# Patient Record
Sex: Male | Born: 1950 | Race: White | Hispanic: No | Smoking: Heavy tobacco smoker
Health system: Southern US, Community
[De-identification: ages and names within clinical notes are randomized; demographics above are authoritative.]

## PROBLEM LIST (undated history)

## (undated) DIAGNOSIS — N185 Chronic kidney disease, stage 5: Secondary | ICD-10-CM

## (undated) DIAGNOSIS — I10 Essential (primary) hypertension: Secondary | ICD-10-CM

## (undated) DIAGNOSIS — E079 Disorder of thyroid, unspecified: Secondary | ICD-10-CM

## (undated) DIAGNOSIS — R809 Proteinuria, unspecified: Secondary | ICD-10-CM

## (undated) DIAGNOSIS — F17200 Nicotine dependence, unspecified, uncomplicated: Secondary | ICD-10-CM

## (undated) DIAGNOSIS — N281 Cyst of kidney, acquired: Secondary | ICD-10-CM

## (undated) DIAGNOSIS — E213 Hyperparathyroidism, unspecified: Secondary | ICD-10-CM

## (undated) DIAGNOSIS — E785 Hyperlipidemia, unspecified: Secondary | ICD-10-CM

## (undated) DIAGNOSIS — K219 Gastro-esophageal reflux disease without esophagitis: Secondary | ICD-10-CM

## (undated) DIAGNOSIS — R7303 Prediabetes: Secondary | ICD-10-CM

## (undated) HISTORY — DX: Disorder of thyroid, unspecified: E07.9

## (undated) HISTORY — DX: Gastro-esophageal reflux disease without esophagitis: K21.9

## (undated) HISTORY — DX: Essential (primary) hypertension: I10

## (undated) HISTORY — DX: Hyperlipidemia, unspecified: E78.5

---

## 2013-11-11 HISTORY — PX: SPINE SURGERY: SHX786

## 2021-03-26 ENCOUNTER — Other Ambulatory Visit: Payer: Self-pay

## 2021-03-26 ENCOUNTER — Encounter: Payer: Self-pay | Admitting: Internal Medicine

## 2021-03-26 ENCOUNTER — Other Ambulatory Visit: Payer: Self-pay | Admitting: Internal Medicine

## 2021-03-26 ENCOUNTER — Ambulatory Visit (INDEPENDENT_AMBULATORY_CARE_PROVIDER_SITE_OTHER): Payer: Medicare Other | Admitting: Internal Medicine

## 2021-03-26 VITALS — BP 138/78 | HR 78 | Ht 71.0 in | Wt 190.0 lb

## 2021-03-26 DIAGNOSIS — I10 Essential (primary) hypertension: Secondary | ICD-10-CM

## 2021-03-26 DIAGNOSIS — F17200 Nicotine dependence, unspecified, uncomplicated: Secondary | ICD-10-CM | POA: Insufficient documentation

## 2021-03-26 DIAGNOSIS — E782 Mixed hyperlipidemia: Secondary | ICD-10-CM

## 2021-03-26 DIAGNOSIS — N401 Enlarged prostate with lower urinary tract symptoms: Secondary | ICD-10-CM

## 2021-03-26 DIAGNOSIS — E039 Hypothyroidism, unspecified: Secondary | ICD-10-CM | POA: Diagnosis not present

## 2021-03-26 DIAGNOSIS — N1831 Chronic kidney disease, stage 3a: Secondary | ICD-10-CM | POA: Insufficient documentation

## 2021-03-26 DIAGNOSIS — N138 Other obstructive and reflux uropathy: Secondary | ICD-10-CM | POA: Diagnosis not present

## 2021-03-26 DIAGNOSIS — G8929 Other chronic pain: Secondary | ICD-10-CM

## 2021-03-26 DIAGNOSIS — M545 Low back pain, unspecified: Secondary | ICD-10-CM

## 2021-03-26 HISTORY — DX: Mixed hyperlipidemia: E78.2

## 2021-03-26 HISTORY — DX: Hypothyroidism, unspecified: E03.9

## 2021-03-26 HISTORY — DX: Other chronic pain: G89.29

## 2021-03-26 HISTORY — DX: Essential (primary) hypertension: I10

## 2021-03-26 HISTORY — DX: Other obstructive and reflux uropathy: N13.8

## 2021-03-26 HISTORY — DX: Low back pain, unspecified: M54.50

## 2021-03-26 HISTORY — DX: Benign prostatic hyperplasia with lower urinary tract symptoms: N40.1

## 2021-03-26 LAB — POC URINALYSIS WITH MICROSCOPIC (NON AUTO)MANUAL RESULT
Bilirubin, UA: NEGATIVE
Crystals: 0
Glucose, UA: NEGATIVE
Ketones, UA: NEGATIVE
Mucus, UA: 0
Nitrite, UA: NEGATIVE
Protein, UA: POSITIVE — AB
RBC: 5 M/uL (ref 4.69–6.13)
Spec Grav, UA: 1.025 (ref 1.010–1.025)
Urobilinogen, UA: 0.2 E.U./dL
WBC Casts, UA: 100
pH, UA: 5 (ref 5.0–8.0)

## 2021-03-26 NOTE — Patient Instructions (Signed)
[facility] will be calling you to schedule an appointment.    Use Breztri - one inhalation twice a day.  We will call you with the urine culture report and let you know if antibiotics are needed.

## 2021-03-26 NOTE — Progress Notes (Signed)
Date:  03/26/2021   Name:  Tyrone Moore   DOB:  [birth year]   MRN:  [id number]  New patient to establish care.  Moving here from [place] to be closer to son and daughter in law.  Retired.  Ex [employer] [place] veteran.  Chief Complaint: Establish Care and Hypertension  Hyperlipidemia This is a chronic problem. The problem is controlled. Associated symptoms include shortness of breath. Pertinent negatives include no chest pain. Current antihyperlipidemic treatment includes statins. There are no compliance problems.  Risk factors for coronary artery disease include hypertension.  Thyroid Problem Presents for follow-up (since 1070) visit. Patient reports no anxiety, constipation, diarrhea, fatigue, palpitations, weight gain or weight loss. The symptoms have been stable. His past medical history is significant for hyperlipidemia. There is no history of heart failure.  Hypertension This is a chronic problem. The problem is controlled (at home 131/70). Associated symptoms include shortness of breath. Pertinent negatives include no chest pain, headaches or palpitations. Past treatments include ACE inhibitors and beta blockers. The current treatment provides significant improvement. There are no compliance problems.  Hypertensive end-organ damage includes kidney disease. There is no history of CAD/MI, CVA or heart failure. Identifiable causes of hypertension include a thyroid problem.  Gastroesophageal Reflux He complains of heartburn and wheezing. He reports no abdominal pain, no chest pain or no coughing. This is a recurrent problem. The problem occurs occasionally. The heartburn does not wake him from sleep. The heartburn does not limit his activity. The heartburn doesn't change with position. Pertinent negatives include no fatigue or weight loss. He has tried a PPI for the symptoms.  Urinary Frequency  This is a chronic problem. The problem has been waxing and waning. The quality of the pain is  described as burning (recently with urination and saw blood once last week). The pain is mild. There has been no fever. Associated symptoms include frequency, hematuria and urgency. He has tried nothing for the symptoms.  BPH - he was started on Flomax sometime last year but it has not helped his urinary stream. He was referred to Urology but was immediately told that he needed surgery without any exam so he left.  No results found for: CREATININE, BUN, NA, K, CL, CO2 No results found for: CHOL, HDL, LDLCALC, LDLDIRECT, TRIG, CHOLHDL No results found for: TSH No results found for: HGBA1C No results found for: WBC, HGB, HCT, MCV, PLT No results found for: ALT, AST, GGT, ALKPHOS, BILITOT   Review of Systems  Constitutional: Negative for fatigue, unexpected weight change, weight gain and weight loss.  HENT: Negative for nosebleeds and trouble swallowing.   Eyes: Negative for visual disturbance.  Respiratory: Positive for shortness of breath and wheezing. Negative for cough and chest tightness.   Cardiovascular: Negative for chest pain, palpitations and leg swelling.  Gastrointestinal: Positive for heartburn. Negative for abdominal pain, constipation and diarrhea.  Genitourinary: Positive for decreased urine volume, difficulty urinating, frequency, hematuria and urgency.  Musculoskeletal: Positive for arthralgias and back pain.  Skin: Negative for rash.  Neurological: Negative for dizziness, weakness, light-headedness and headaches.  Psychiatric/Behavioral: Negative for dysphoric mood and sleep disturbance. The patient is not nervous/anxious.     Patient Active Problem List   Diagnosis Date Noted  . 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  . Stage 3a chronic kidney disease ([facility]) 03/26/2021  . Mixed hyperlipidemia 03/26/2021  . Acquired hypothyroidism 03/26/2021  . Essential hypertension 03/26/2021  . Tobacco use disorder 03/26/2021  . Chronic bilateral  low back pain  without sciatica 03/26/2021    No Known Allergies  Past Surgical History:  Procedure Laterality Date  . SPINE SURGERY  2015   X 4 surgery - lumbar disc    Social History   Tobacco Use  . Smoking status: Current Every Day Smoker    Packs/day: 1.50    Years: 54.00    Pack years: 81.00  . Smokeless tobacco: Never Used  Vaping Use  . Vaping Use: Never used  Substance Use Topics  . Alcohol use: Not Currently  . Drug use: Never     Medication list has been reviewed and updated.  Current Meds  Medication Sig  . atorvastatin (LIPITOR) 40 MG tablet Take 40 mg by mouth daily.  . carvedilol (COREG) 25 MG tablet Take 25 mg by mouth 2 (two) times daily with a meal.  . Cholecalciferol (D3-1000) 25 MCG (1000 UT) capsule Take 1,000 Units by mouth daily.  Marland Kitchen levothyroxine (EUTHYROX) 112 MCG tablet Take 112 mcg by mouth daily before breakfast.  . lisinopril (ZESTRIL) 10 MG tablet Take 10 mg by mouth daily.  . Magnesium 500 MG TABS Take 1 tablet by mouth daily.  . Multiple Vitamins-Minerals (MULTIVITAL-M PO) Take by mouth.  Marland Kitchen omeprazole (PRILOSEC) 20 MG capsule Take 20 mg by mouth daily.  . tamsulosin (FLOMAX) 0.4 MG CAPS capsule Take 0.8 mg by mouth daily after supper.    PHQ 2/9 Scores 03/26/2021  PHQ - 2 Score 1  PHQ- 9 Score 9    GAD 7 : Generalized Anxiety Score 03/26/2021  Nervous, Anxious, on Edge 0  Control/stop worrying 0  Worry too much - different things 0  Trouble relaxing 0  Restless 0  Easily annoyed or irritable 0  Afraid - awful might happen 0  Total GAD 7 Score 0  Anxiety Difficulty Not difficult at all    BP Readings from Last 3 Encounters:  03/26/21 138/78    Physical Exam Vitals and nursing note reviewed.  Constitutional:      General: He is not in acute distress.    Appearance: He is well-developed.  HENT:     Head: Normocephalic and atraumatic.  Neck:     Vascular: No carotid bruit.  Cardiovascular:     Rate and Rhythm: Normal rate and regular  rhythm.     Pulses: Normal pulses.     Heart sounds: No murmur heard.   Pulmonary:     Effort: Pulmonary effort is normal. No respiratory distress.     Breath sounds: Wheezing and rhonchi present.  Musculoskeletal:     Cervical back: Normal range of motion.       Back:     Right lower leg: No edema.     Left lower leg: No edema.     Comments: At least three separate surgical incision sites seen  Lymphadenopathy:     Cervical: No cervical adenopathy.  Skin:    General: Skin is warm and dry.     Capillary Refill: Capillary refill takes less than 2 seconds.     Findings: No rash.  Neurological:     General: No focal deficit present.     Mental Status: He is alert and oriented to person, place, and time.     Motor: Motor function is intact.     Gait: Gait is intact.     Deep Tendon Reflexes:     Reflex Scores:      Patellar reflexes are 2+ on the right side and  2+ on the left side. Psychiatric:        Mood and Affect: Mood normal.        Behavior: Behavior normal.     Wt Readings from Last 3 Encounters:  03/26/21 190 lb (86.2 kg)    BP 138/78   Pulse 78   Ht '5\' 11"'$  (1.803 m)   Wt 190 lb (86.2 kg)   SpO2 97%   BMI 26.50 kg/m   Assessment and Plan: 1. Essential hypertension Clinically stable exam with well controlled BP on lisinopril and coreg. Tolerating medications without side effects at this time. Pt to continue current regimen and low sodium diet; benefits of regular exercise as able discussed. - CBC with Differential/Platelet - Comprehensive metabolic panel  2. Acquired hypothyroidism Supplemented; wait for labs to refill medication - TSH + free T4  3. Mixed hyperlipidemia On appropriate statin therapy No hx of CAD or CVA - Lipid panel  4. Stage 3a chronic kidney disease (HCC) Pt is aware and avoids nsaids He continues smoke  - Comprehensive metabolic panel  5. BPH with obstruction/lower urinary tract symptoms From hx he appears to have  significant obstruction UA is positive as well so will wait for culture to treat with antibiotics - POC urinalysis w microscopic (non auto) - PSA - Ambulatory referral to Urology - Urine Culture  6. Tobacco use disorder Pt is not interested in quitting He is not interested in LDCT screening He is not interested in Prevnar-20 Samples of Breztri given - one inhalation bid  7. Chronic bilateral low back pain without sciatica S/p several surgeries He is not on any daily medication   Partially dictated using Editor, commissioning. Any errors are unintentional.  Halina Maidens, MD [facility]  03/26/2021

## 2021-03-27 ENCOUNTER — Encounter: Payer: Self-pay | Admitting: Internal Medicine

## 2021-03-27 DIAGNOSIS — N184 Chronic kidney disease, stage 4 (severe): Secondary | ICD-10-CM

## 2021-03-27 HISTORY — DX: Chronic kidney disease, stage 4 (severe): N18.4

## 2021-03-27 LAB — CBC WITH DIFFERENTIAL/PLATELET
Basophils Absolute: 0.1 10*3/uL (ref 0.0–0.2)
Basos: 1 %
EOS (ABSOLUTE): 0.5 10*3/uL — ABNORMAL HIGH (ref 0.0–0.4)
Eos: 6 %
Hematocrit: 35.8 % — ABNORMAL LOW (ref 37.5–51.0)
Hemoglobin: 12.2 g/dL — ABNORMAL LOW (ref 13.0–17.7)
Immature Grans (Abs): 0 10*3/uL (ref 0.0–0.1)
Immature Granulocytes: 0 %
Lymphocytes Absolute: 1.9 10*3/uL (ref 0.7–3.1)
Lymphs: 24 %
MCH: 32.9 pg (ref 26.6–33.0)
MCHC: 34.1 g/dL (ref 31.5–35.7)
MCV: 97 fL (ref 79–97)
Monocytes Absolute: 0.8 10*3/uL (ref 0.1–0.9)
Monocytes: 10 %
Neutrophils Absolute: 4.8 10*3/uL (ref 1.4–7.0)
Neutrophils: 59 %
Platelets: 186 10*3/uL (ref 150–450)
RBC: 3.71 x10E6/uL — ABNORMAL LOW (ref 4.14–5.80)
RDW: 12.3 % (ref 11.6–15.4)
WBC: 7.9 10*3/uL (ref 3.4–10.8)

## 2021-03-27 LAB — COMPREHENSIVE METABOLIC PANEL
ALT: 14 IU/L (ref 0–44)
AST: 18 IU/L (ref 0–40)
Albumin/Globulin Ratio: 1.6 (ref 1.2–2.2)
Albumin: 4.2 g/dL (ref 3.8–4.8)
Alkaline Phosphatase: 149 IU/L — ABNORMAL HIGH (ref 44–121)
BUN/Creatinine Ratio: 12 (ref 10–24)
BUN: 39 mg/dL — ABNORMAL HIGH (ref 8–27)
Bilirubin Total: 0.3 mg/dL (ref 0.0–1.2)
CO2: 18 mmol/L — ABNORMAL LOW (ref 20–29)
Calcium: 9.1 mg/dL (ref 8.6–10.2)
Chloride: 111 mmol/L — ABNORMAL HIGH (ref 96–106)
Creatinine, Ser: 3.25 mg/dL — ABNORMAL HIGH (ref 0.76–1.27)
Globulin, Total: 2.7 g/dL (ref 1.5–4.5)
Glucose: 85 mg/dL (ref 65–99)
Potassium: 5.3 mmol/L — ABNORMAL HIGH (ref 3.5–5.2)
Sodium: 143 mmol/L (ref 134–144)
Total Protein: 6.9 g/dL (ref 6.0–8.5)
eGFR: 20 mL/min/{1.73_m2} — ABNORMAL LOW (ref >59)

## 2021-03-27 LAB — LIPID PANEL
Chol/HDL Ratio: 4 ratio (ref 0.0–5.0)
Cholesterol, Total: 129 mg/dL (ref 100–199)
HDL: 32 mg/dL — ABNORMAL LOW (ref >39)
LDL Chol Calc (NIH): 70 mg/dL (ref 0–99)
Triglycerides: 157 mg/dL — ABNORMAL HIGH (ref 0–149)
VLDL Cholesterol Cal: 27 mg/dL (ref 5–40)

## 2021-03-27 LAB — TSH+FREE T4
Free T4: 1.55 ng/dL (ref 0.82–1.77)
TSH: 0.712 u[IU]/mL (ref 0.450–4.500)

## 2021-03-27 LAB — PSA: Prostate Specific Ag, Serum: 4 ng/mL (ref 0.0–4.0)

## 2021-03-28 ENCOUNTER — Other Ambulatory Visit: Payer: Self-pay

## 2021-03-28 ENCOUNTER — Other Ambulatory Visit: Payer: Self-pay | Admitting: Internal Medicine

## 2021-03-28 DIAGNOSIS — N289 Disorder of kidney and ureter, unspecified: Secondary | ICD-10-CM

## 2021-03-28 DIAGNOSIS — E039 Hypothyroidism, unspecified: Secondary | ICD-10-CM

## 2021-03-28 LAB — URINE CULTURE

## 2021-03-28 MED ORDER — LEVOTHYROXINE SODIUM 112 MCG PO TABS: 112.0000 ug | ORAL_TABLET | Freq: Every day | ORAL | 1 refills | Status: DC

## 2021-03-28 NOTE — Progress Notes (Signed)
Please review pt wants refill on levothyroxine 112 mcg.  KP

## 2021-04-06 ENCOUNTER — Other Ambulatory Visit
Admission: RE | Admit: 2021-04-06 | Discharge: 2021-04-06 | Disposition: A | Discharge status: Home / Self Care | Payer: Medicare Other | Attending: Urology | Admitting: Urology

## 2021-04-06 ENCOUNTER — Other Ambulatory Visit: Payer: Self-pay

## 2021-04-06 ENCOUNTER — Encounter: Payer: Self-pay | Admitting: Urology

## 2021-04-06 ENCOUNTER — Ambulatory Visit: Payer: Medicare Other | Admitting: Urology

## 2021-04-06 VITALS — BP 132/66 | HR 80 | Ht 71.0 in | Wt 208.0 lb

## 2021-04-06 DIAGNOSIS — N401 Enlarged prostate with lower urinary tract symptoms: Secondary | ICD-10-CM

## 2021-04-06 DIAGNOSIS — N138 Other obstructive and reflux uropathy: Secondary | ICD-10-CM | POA: Insufficient documentation

## 2021-04-06 DIAGNOSIS — N184 Chronic kidney disease, stage 4 (severe): Secondary | ICD-10-CM

## 2021-04-06 DIAGNOSIS — R310 Gross hematuria: Secondary | ICD-10-CM | POA: Diagnosis not present

## 2021-04-06 LAB — URINALYSIS, COMPLETE (UACMP) WITH MICROSCOPIC
Bilirubin Urine: NEGATIVE
Glucose, UA: NEGATIVE mg/dL
Ketones, ur: NEGATIVE mg/dL
Nitrite: NEGATIVE
Protein, ur: >300 mg/dL — AB
RBC / HPF: >50 RBC/hpf (ref 0–5)
Specific Gravity, Urine: 1.02 (ref 1.005–1.030)
WBC, UA: >50 WBC/hpf (ref 0–5)
pH: 6 (ref 5.0–8.0)

## 2021-04-06 LAB — BLADDER SCAN AMB NON-IMAGING

## 2021-04-06 NOTE — Progress Notes (Signed)
04/06/2021 11:22 AM   Tyrone Moore [birth year] [id number]  Referring provider: Glean Hess, MD [address] [place],  [place] [address]  Chief Complaint  Patient presents with  . Benign Prostatic Hypertrophy    New patient    HPI: 70 year old male who presents today for further evaluation of progressive urinary symptoms.  He recently moved to live with his son, previously lived in [place].  He reports that over the past year, he has had progressive worsening urinary symptoms which are primarily nocturia but also weak stream, incomplete bladder emptying as well as some daytime frequency.  He was started on Flomax about a year ago and has not noticed much improvement with this.  IPSS as below.  His most recent PSA is 4.0, he has no previous for comparison.  He also reports that about a month ago, he experienced an episode of painless gross hematuria.  He is a longtime smoker, lifelong a pack and a half a day.  No history of kidney stones.  No history of flank pain.  Urinalysis provided to PCP a few weeks ago was somewhat suspicious but the urine culture was negative.  There was blood present.  He is planning on repeating this today.   Results for orders placed or performed in visit on 04/06/21  BLADDER SCAN AMB NON-IMAGING  Result Value Ref Range   Scan Result 131m       IPSS    Row Name 04/06/21 1100         International Prostate Symptom Score   How often have you had the sensation of not emptying your bladder? About half the time     How often have you had to urinate less than every two hours? More than half the time     How often have you found you stopped and started again several times when you urinated? About half the time     How often have you found it difficult to postpone urination? Less than half the time     How often have you had a weak urinary stream? About half the time     How often have you had to strain to start urination? More  than half the time     How many times did you typically get up at night to urinate? 5 Times     Total IPSS Score 24           Quality of Life due to urinary symptoms   If you were to spend the rest of your life with your urinary condition just the way it is now how would you feel about that? Unhappy            Score:  1-7 Mild 8-19 Moderate 20-35 Severe    PMH: Past Medical History:  Diagnosis Date  . Acquired hypothyroidism 03/26/2021  . 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  . Chronic bilateral low back pain without sciatica 03/26/2021   S/p 4 lumbar spine surgeries  . CKD (chronic kidney disease) stage 4, GFR 15-29 ml/min (HCC) 03/27/2021  . Essential hypertension 03/26/2021  . Hyperlipidemia   . Hypertension   . Mixed hyperlipidemia 03/26/2021  . Thyroid disease     Surgical History: Past Surgical History:  Procedure Laterality Date  . SPINE SURGERY  2015   X 4 surgery - lumbar disc    Home Medications:  Allergies as of 04/06/2021   No Known Allergies     Medication List  Accurate as of Apr 06, 2021 11:22 AM. If you have any questions, ask your nurse or doctor.        atorvastatin 40 MG tablet Commonly known as: LIPITOR Take 40 mg by mouth daily.   carvedilol 25 MG tablet Commonly known as: COREG Take 25 mg by mouth 2 (two) times daily with a meal.   D3-1000 25 MCG (1000 UT) capsule Generic drug: Cholecalciferol Take 1,000 Units by mouth daily.   levothyroxine 112 MCG tablet Commonly known as: Euthyrox Take 1 tablet (112 mcg total) by mouth daily before breakfast.   lisinopril 10 MG tablet Commonly known as: ZESTRIL Take 10 mg by mouth daily.   Magnesium 500 MG Tabs Take 1 tablet by mouth daily.   MULTIVITAL-M PO Take by mouth.   omeprazole 20 MG capsule Commonly known as: PRILOSEC Take 20 mg by mouth daily.   tamsulosin 0.4 MG Caps capsule Commonly known as: FLOMAX Take 0.8 mg by mouth daily after supper.        Allergies: No Known Allergies  Family History: Family History  Problem Relation Age of Onset  . Hypertension Mother   . Heart disease Mother   . Heart disease Sister   . Hypertension Sister   . Prostate cancer Neg Hx   . Bladder Cancer Neg Hx   . Kidney cancer Neg Hx     Social History:  reports that he has been smoking. He has a 81.00 pack-year smoking history. He has never used smokeless tobacco. He reports previous alcohol use. He reports that he does not use drugs.   Physical Exam: BP 132/66   Pulse 80   Ht '5\' 11"'$  (1.803 m)   Wt 208 lb (94.3 kg)   BMI 29.01 kg/m   Constitutional:  Alert and oriented, No acute distress.  Accompanied by son today.  Cigarettes noted in pocket.  Smoker's cough. HEENT: [place] AT, moist mucus membranes.  Trachea midline, no masses. Cardiovascular: No clubbing, cyanosis, or edema. Respiratory: Normal respiratory effort, no increased work of breathing. GI: Abdomen is soft, nontender, nondistended, no abdominal masses Rectal: Normal sphincter tone.  No prostate nodules.  Enlarged, 50+ cc. Skin: No rashes, bruises or suspicious lesions. Neurologic: Grossly intact, no focal deficits, moving all 4 extremities. Psychiatric: Normal mood and affect.  Laboratory Data: Lab Results  Component Value Date   WBC 7.9 03/26/2021   HGB 12.2 (L) 03/26/2021   HCT 35.8 (L) 03/26/2021   MCV 97 03/26/2021   PLT 186 03/26/2021    Lab Results  Component Value Date   CREATININE 3.25 (H) 03/26/2021    Urinalysis    Component Value Date/Time   BILIRUBINUR neg 03/26/2021 1433   PROTEINUR Positive (A) 03/26/2021 1433   UROBILINOGEN 0.2 03/26/2021 1433   NITRITE neg 03/26/2021 1433   LEUKOCYTESUR Moderate (2+) (A) 03/26/2021 1433    Lab Results  Component Value Date   MUCUS 0 03/26/2021     Assessment & Plan:    1. BPH with obstruction/lower urinary tract symptoms Progressive obstructive urinary symptoms, likely related to underlying  BPH  Notably, his PSA is borderline elevated but is possibly appropriate for his gland size, rectal exam today was benign we will continue to follow this, no indication for prostate biopsy at this time  We discussed options including the addition of finasteride for optimization of pharmacological management of his BPH along with possible side effects of this medication which may be lifelong.  Alternatively, he can consider an outlet procedure we  discussed the risk and benefits and possible types of procedures this may entail.  He is interested in this.  As such, we will go ahead and schedule cystoscopy/prostate sizing. - BLADDER SCAN AMB NON-IMAGING  2. Gross hematuria Gross and microscopic hematuria, repeat urinalysis today pending  Given his extensive and ongoing smoking history, significant concern for underlying bladder malignancy.  In light of contrast shortage, not able to perform a CT urogram.  We will go ahead and plan to proceed with cystoscopy and pending results, will pursue further upper tract imaging at that time pending contrast availability and/or cystoscopic findings which may necessitate intervention in the operating room.  3. CKD (chronic kidney disease) stage 4, GFR 15-29 ml/min (HCC) Baseline creatinine elevated, borderline bladder emptying today although suspect this is not likely contributing factor, will continue further work-up as above to assess  If you are unable to pursue upper tract imaging, may consider renal ultrasound and lieu of CT urogram, TBD  Follow-up for cystoscopy/TRUS  Hollice Espy, MD  [facility] [address] [place], [place] [address] [phone]

## 2021-04-06 NOTE — Patient Instructions (Signed)
Cystoscopy Cystoscopy is a procedure that is used to help diagnose and sometimes treat conditions that affect the lower urinary tract. The lower urinary tract includes the bladder and the urethra. The urethra is the tube that drains urine from the bladder. Cystoscopy is done using a thin, tube-shaped instrument with a light and camera at the end (cystoscope). The cystoscope may be hard or flexible, depending on the goal of the procedure. The cystoscope is inserted through the urethra, into the bladder. Cystoscopy may be recommended if you have:  Urinary tract infections that keep coming back.  Blood in the urine (hematuria).  An inability to control when you urinate (urinary incontinence) or an overactive bladder.  Unusual cells found in a urine sample.  A blockage in the urethra, such as a urinary stone.  Painful urination.  An abnormality in the bladder found during an intravenous pyelogram (IVP) or CT scan. Cystoscopy may also be done to remove a sample of tissue to be examined under a microscope (biopsy). What are the risks? Generally, this is a safe procedure. However, problems may occur, including:  Infection.  Bleeding.  What happens during the procedure?  1. You will be given one or more of the following: ? A medicine to numb the area (local anesthetic). 2. The area around the opening of your urethra will be cleaned. 3. The cystoscope will be passed through your urethra into your bladder. 4. Germ-free (sterile) fluid will flow through the cystoscope to fill your bladder. The fluid will stretch your bladder so that your health care provider can clearly examine your bladder walls. 5. Your doctor will look at the urethra and bladder. 6. The cystoscope will be removed The procedure may vary among health care providers  What can I expect after the procedure? After the procedure, it is common to have: 1. Some soreness or pain in your abdomen and urethra. 2. Urinary symptoms.  These include: ? Mild pain or burning when you urinate. Pain should stop within a few minutes after you urinate. This may last for up to 1 week. ? A small amount of blood in your urine for several days. ? Feeling like you need to urinate but producing only a small amount of urine. Follow these instructions at home: General instructions  Return to your normal activities as told by your health care provider.   Do not drive for 24 hours if you were given a sedative during your procedure.  Watch for any blood in your urine. If the amount of blood in your urine increases, call your health care provider.  If a tissue sample was removed for testing (biopsy) during your procedure, it is up to you to get your test results. Ask your health care provider, or the department that is doing the test, when your results will be ready.  Drink enough fluid to keep your urine pale yellow.  Keep all follow-up visits as told by your health care provider. This is important. Contact a health care provider if you:  Have pain that gets worse or does not get better with medicine, especially pain when you urinate.  Have trouble urinating.  Have more blood in your urine. Get help right away if you:  Have blood clots in your urine.  Have abdominal pain.  Have a fever or chills.  Are unable to urinate. Summary  Cystoscopy is a procedure that is used to help diagnose and sometimes treat conditions that affect the lower urinary tract.  Cystoscopy is done using   a thin, tube-shaped instrument with a light and camera at the end.  After the procedure, it is common to have some soreness or pain in your abdomen and urethra.  Watch for any blood in your urine. If the amount of blood in your urine increases, call your health care provider.  If you were prescribed an antibiotic medicine, take it as told by your health care provider. Do not stop taking the antibiotic even if you start to feel better. This  information is not intended to replace advice given to you by your health care provider. Make sure you discuss any questions you have with your health care provider. Document Revised: 10/20/2018 Document Reviewed: 10/20/2018 Elsevier Patient Education  [employer].   Transrectal Ultrasound A transrectal ultrasound is a procedure that uses sound waves to create images of the prostate gland and nearby tissues. For this procedure, an ultrasound probe is placed in the rectum. The probe sends sound waves through the wall of the rectum into the prostate gland, which is located in front of the rectum. The images show the size and shape of the prostate gland and nearby structures. You may need this test if you have:  Trouble urinating.  Trouble getting your partner pregnant (infertility).  An abnormal result from a prostate screening exam. Tell a health care provider about:  Any allergies you have.  All medicines you are taking, including vitamins, herbs, eye drops, creams, and over-the-counter medicines.  Any blood disorders you have.  Any medical conditions you have.  Any surgeries you have had. What are the risks? Generally, this is a safe procedure. However, problems may occur, including:  Discomfort during the procedure. This is rare.  Blood in your urine or sperm after the procedure. This may occur if a sample of tissue (biopsy) is taken during the procedure. What happens before the procedure?  Your health care provider may instruct you to use an enema 1-4 hours before the procedure. Follow instructions from your health care provider about how to do the enema.  Ask your health care provider about: ? Changing or stopping your regular medicines. This is especially important if you are taking diabetes medicines or blood thinners. ? Taking medicines such as aspirin and ibuprofen. These medicines can thin your blood. Do not take these medicines unless your health care provider  tells you to take them. ? Taking over-the-counter medicines, vitamins, herbs, and supplements. What happens during the procedure?  You will be asked to lie down on your left side on an exam table.  You will bend your knees toward your chest.  Gel will be put on a probe, and the probe will be gently inserted into your rectum. This may cause a feeling of fullness.  The probe will send signals to a computer that will create images. These will be displayed on a monitor that looks like a small television screen.  The technician will slightly rotate the probe throughout the procedure. While rotating the probe, he or she will view and capture images of the prostate gland and the surrounding structures from different angles.  Your health care provider may take a biopsy sample of prostate tissue during the procedure. The images captured from the ultrasound will help guide the needle used to remove a sample of tissue. The sample will be sent to a lab for testing.  The probe will be removed. The procedure may vary among health care providers and hospitals. What can I expect after the procedure?  It  is up to you to get the results of your procedure. Ask your health care provider, or the department that is doing the procedure, when your results will be ready.  Keep all follow-up visits as told by your health care provider. This is important. Summary  A transrectal ultrasound is a procedure that uses sound waves to create images of the prostate gland and nearby tissues.  The images show the size and shape of the prostate gland and nearby structures.  Before the procedure, ask your health care provider about changing or stopping your regular medicines. This is especially important if you are taking diabetes medicines or blood thinners. This information is not intended to replace advice given to you by your health care provider. Make sure you discuss any questions you have with your health care  provider. Document Revised: 12/22/2019 Document Reviewed: 12/22/2019 Elsevier Patient Education  2021 [employer].

## 2021-05-04 ENCOUNTER — Other Ambulatory Visit: Payer: Self-pay

## 2021-05-04 ENCOUNTER — Ambulatory Visit (INDEPENDENT_AMBULATORY_CARE_PROVIDER_SITE_OTHER): Payer: Medicare Other | Admitting: Urology

## 2021-05-04 ENCOUNTER — Other Ambulatory Visit
Admission: RE | Admit: 2021-05-04 | Discharge: 2021-05-04 | Disposition: A | Discharge status: Home / Self Care | Payer: Medicare Other | Attending: Urology | Admitting: Urology

## 2021-05-04 VITALS — BP 116/65 | HR 89 | Ht 71.0 in | Wt 208.0 lb

## 2021-05-04 DIAGNOSIS — N138 Other obstructive and reflux uropathy: Secondary | ICD-10-CM

## 2021-05-04 DIAGNOSIS — R310 Gross hematuria: Secondary | ICD-10-CM | POA: Insufficient documentation

## 2021-05-04 DIAGNOSIS — N401 Enlarged prostate with lower urinary tract symptoms: Secondary | ICD-10-CM

## 2021-05-04 DIAGNOSIS — N184 Chronic kidney disease, stage 4 (severe): Secondary | ICD-10-CM

## 2021-05-04 LAB — URINALYSIS, COMPLETE (UACMP) WITH MICROSCOPIC
Bilirubin Urine: NEGATIVE
Glucose, UA: NEGATIVE mg/dL
Ketones, ur: NEGATIVE mg/dL
Nitrite: NEGATIVE
Protein, ur: >300 mg/dL — AB
Specific Gravity, Urine: 1.025 (ref 1.005–1.030)
WBC, UA: >50 WBC/hpf (ref 0–5)
pH: 5.5 (ref 5.0–8.0)

## 2021-05-04 MED ORDER — CEPHALEXIN 500 MG PO CAPS: 500.0000 mg | ORAL_CAPSULE | Freq: Three times a day (TID) | ORAL | 0 refills | Status: AC

## 2021-05-04 MED ORDER — CEFTRIAXONE SODIUM 1 G IJ SOLR
1.0000 g | Freq: Once | INTRAMUSCULAR | Status: AC
  Administered 2021-05-04: 1 g via INTRAMUSCULAR

## 2021-05-04 NOTE — Progress Notes (Signed)
IM Injection  Patient is present today for an IM Injection  Drug: Rocephin Dose:1GM Location:Right upper outer buttocks Lot: [id number] Exp:05/2021 Patient tolerated well, no complications were noted  Preformed by: Fonnie Jarvis, CMA

## 2021-05-04 NOTE — Progress Notes (Signed)
05/04/21  Chief Complaint  Patient presents with   Cysto    TRUS     HPI: 70 year old male with worsening urinary symptoms, pyuria and gross hematuria who presents today for cystoscopy for further evaluation of his urinary symptoms.  Urinalysis today and is again grossly positive although urine cultures have been negative.  As such, we elected to proceed today for diagnostic reasons and he was administered 1 g of IM Rocephin.   Please see previous notes for details.     There were no vitals taken for this visit. NED. A&Ox3.   No respiratory distress   Abd soft, NT, ND Normal phallus with bilateral descended testicles    Cystoscopy Procedure Note  Patient identification was confirmed, informed consent was obtained, and patient was prepped using Betadine solution.  Lidocaine jelly was administered per urethral meatus.    Preoperative abx where received prior to procedure.     Pre-Procedure: - Inspection reveals a normal caliber ureteral meatus.  Procedure: The flexible cystoscope was introduced without difficulty - No urethral strictures/lesions are present. - Enlarged prostate  - Elevated bladder neck - Bilateral ureteral orifices identified -Heavily trabeculated bladder initially with extremely purulent and debris-filled urine.  I irrigated the bladder several times until visualization was improved.  No obvious tumors were visualized although visualization was suboptimal.  Bladder was heavily trabeculated with saccules and probable diverticula.   Post-Procedure: - Patient tolerated the procedure well     Prostate transrectal ultrasound sizing   Informed consent was obtained after discussing risks/benefits of the procedure.  A time out was performed to ensure correct patient identity.   Pre-Procedure: -Transrectal probe was placed with some difficulty, did not particularly tolerate the probe -Briefly was able to visualize the prostate but not measure, thickened  trabeculated bladder appreciated     Assessment/ Plan:  1. Gross hematuria Cystoscopy today without any obvious tumors although visualization was suboptimal  Unable to receive IV contrast secondary to chronic kidney disease, as such we will order a noncontrast CT scan of the abdomen pelvis for further evaluation and consider retrograde pyelogram to further image the upper tracts at the time of probable outlet procedure - Urine Culture; Future - cefTRIAXone (ROCEPHIN) injection 1 g - CT ABDOMEN PELVIS WO CONTRAST; Future  2. BPH with obstruction/lower urinary tract symptoms Significantly enlarged prostate with what appears to be nearly end-stage bladder with sequela of chronic outlet obstruction  Unable to size prostate today, will measure on noncontrast CT scan to obtain volume and guide surgical management, failed Flomax  3. CKD (chronic kidney disease) stage 4, GFR 15-29 ml/min (HCC) As above, outlet may be playing a component of upper tract compromise  4.  Pyuria Suspect chronic colonization secondary to incomplete emptying, will repeat the culture again today and go ahead and treated with Keflex as well as received IV ceftriaxone today for probable presumed infection as a precaution after endoscopic evaluation today   Hollice Espy, MD

## 2021-05-06 LAB — URINE CULTURE: Culture: NO GROWTH

## 2021-05-16 ENCOUNTER — Ambulatory Visit: Payer: Medicare Other

## 2021-05-16 DIAGNOSIS — R809 Proteinuria, unspecified: Secondary | ICD-10-CM | POA: Insufficient documentation

## 2021-05-17 ENCOUNTER — Ambulatory Visit
Admission: RE | Admit: 2021-05-17 | Discharge: 2021-05-17 | Disposition: A | Discharge status: Home / Self Care | Payer: Medicare Other | Source: Ambulatory Visit | Attending: Urology | Admitting: Urology

## 2021-05-17 ENCOUNTER — Other Ambulatory Visit: Payer: Self-pay

## 2021-05-17 DIAGNOSIS — R310 Gross hematuria: Secondary | ICD-10-CM | POA: Diagnosis present

## 2021-05-17 IMAGING — CT CT ABD-PELV W/O CM
1 of 2 series · 14 of 32 positions shown, 18 images · non-contrast
Comparison: None.

CLINICAL DATA: Hematuria.

EXAM:
CT ABDOMEN AND PELVIS WITHOUT CONTRAST
TECHNIQUE: Multidetector CT imaging of the abdomen and pelvis was performed
following the standard protocol without IV contrast.

[Series 2: axial st · axial · 0.80mm/px · z∈[-1187,-717]mm · 14 of 104 slices shown, 18 images]
[im 5/104  soft-tissue]
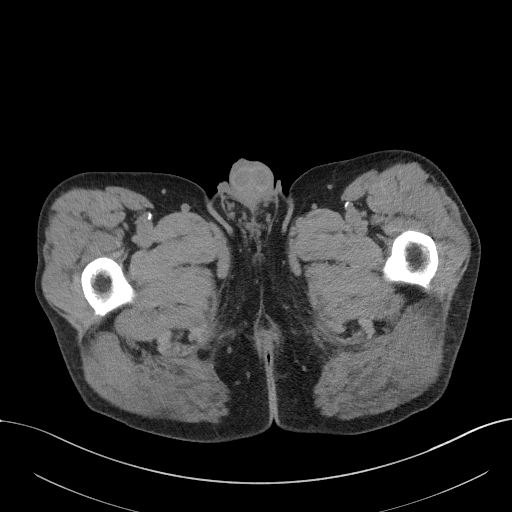
[im 5/104  bone]
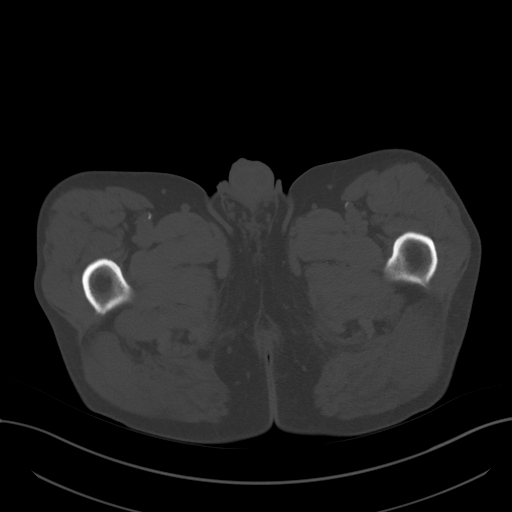
[im 14/104  soft-tissue]
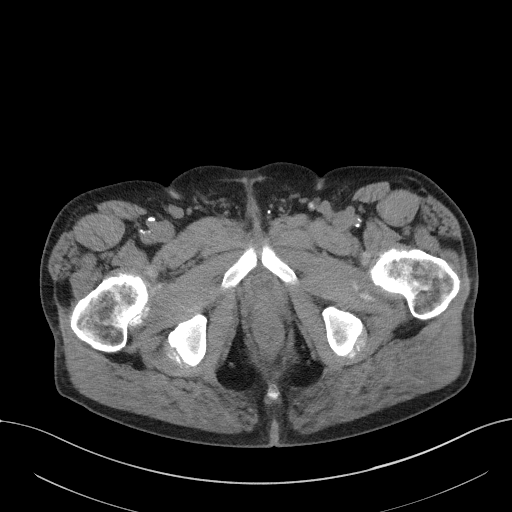
[im 23/104  soft-tissue]
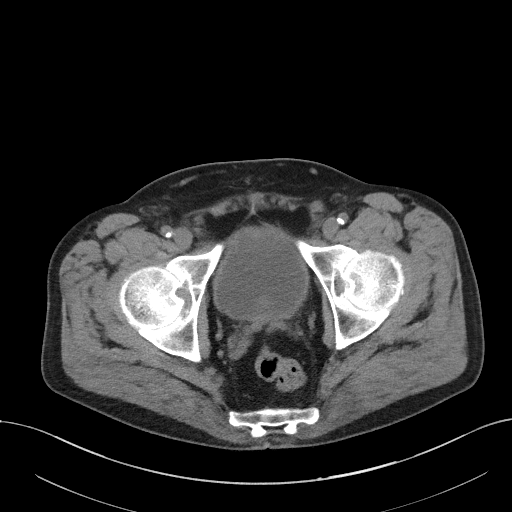
[im 32/104  soft-tissue]
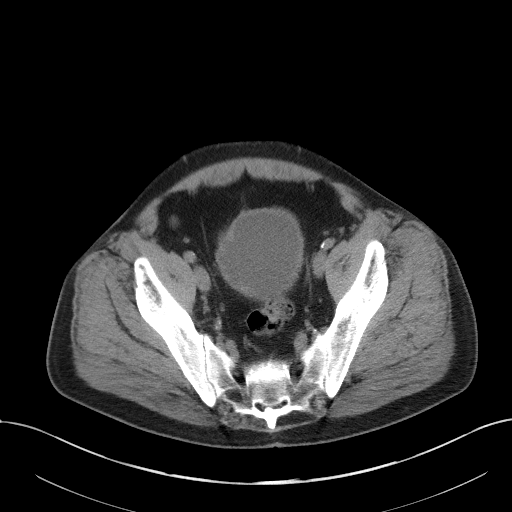
[im 41/104  soft-tissue]
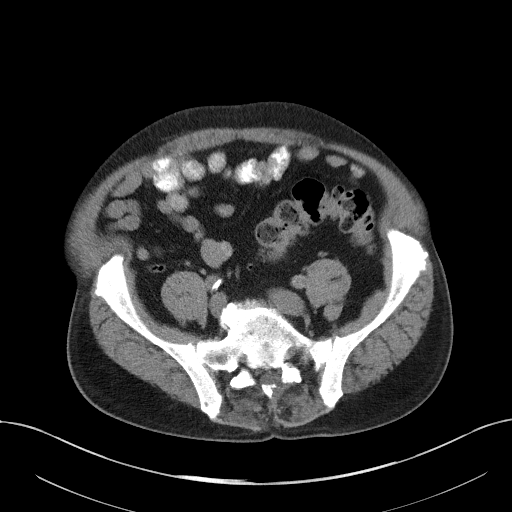
[im 50/104  soft-tissue]
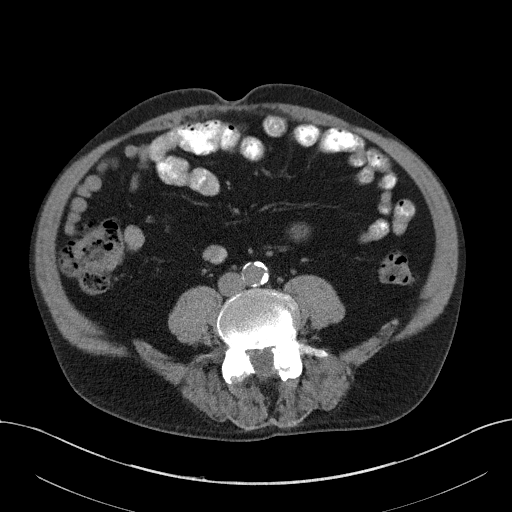
[im 54/104  soft-tissue]
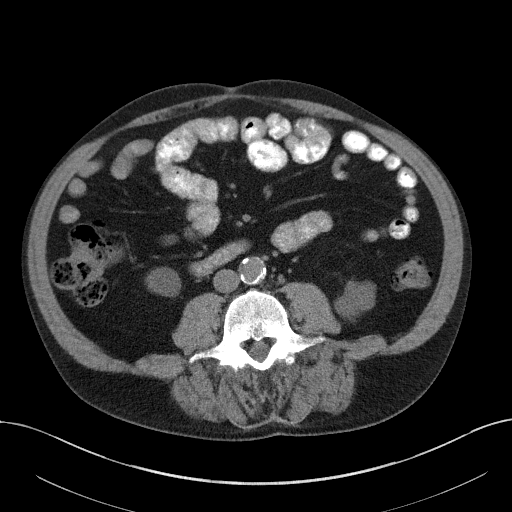
[im 63/104  soft-tissue]
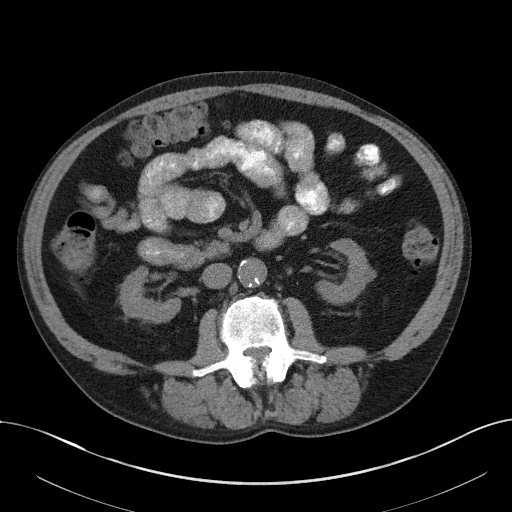
[im 72/104  soft-tissue]
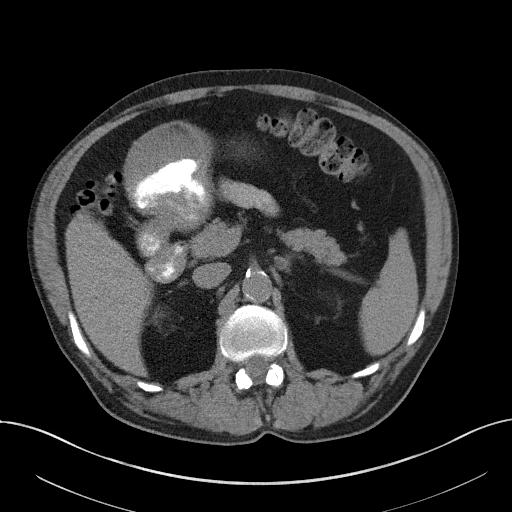
[im 72/104  bone]
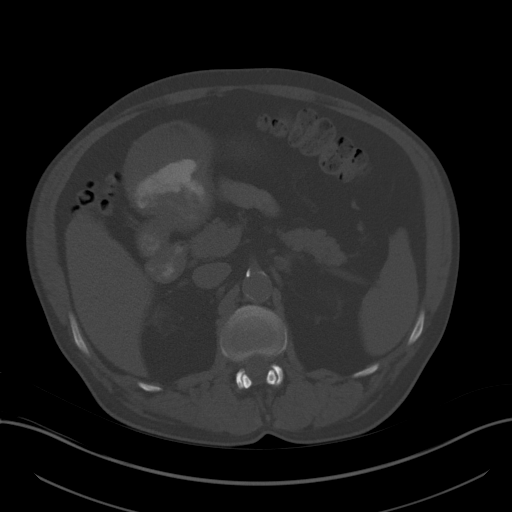
[im 81/104  soft-tissue]
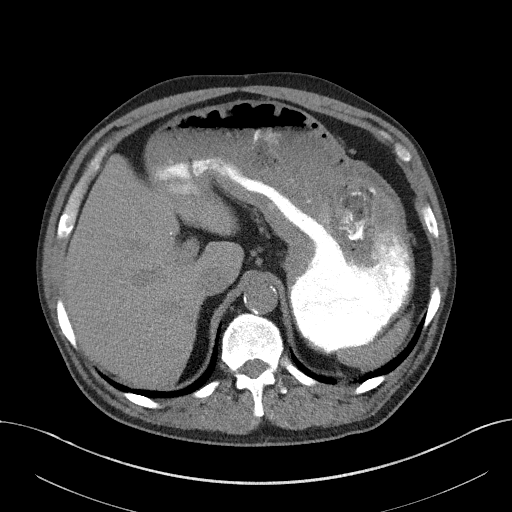
[im 86/104  lung]
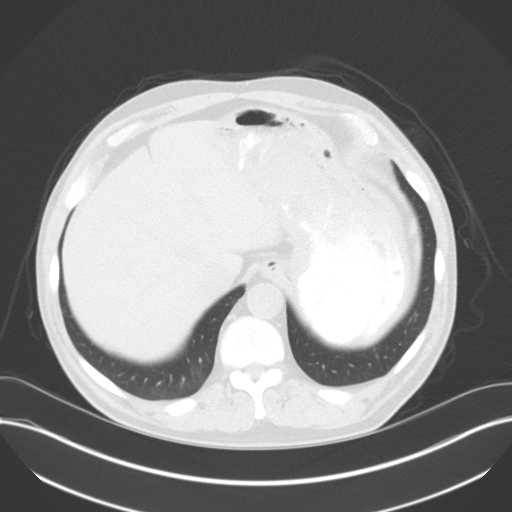
[im 90/104  soft-tissue]
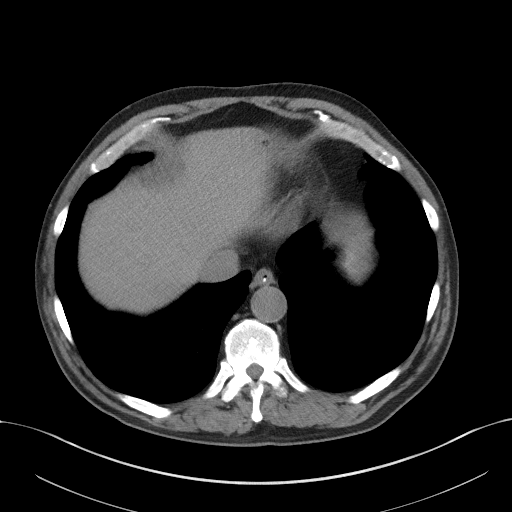
[im 90/104  lung]
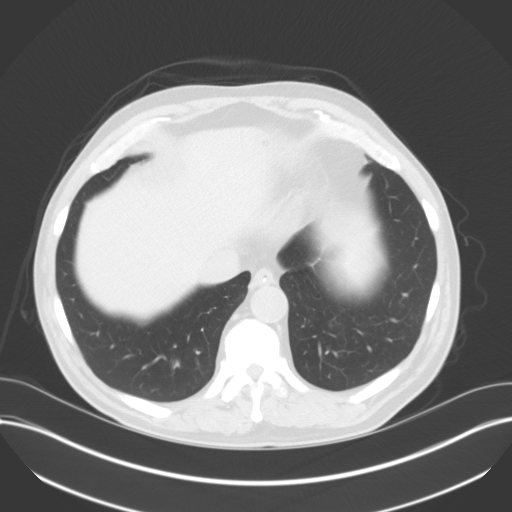
[im 95/104  lung]
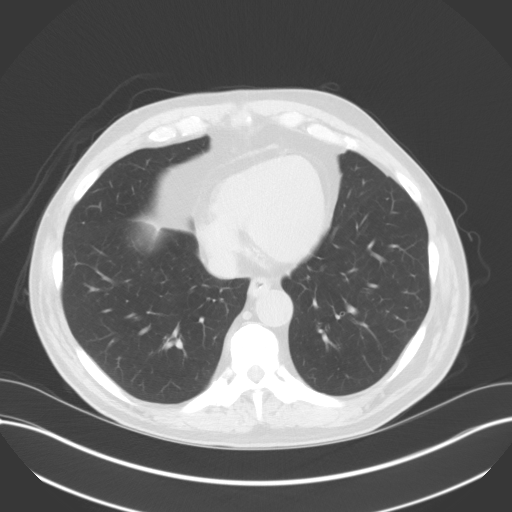
[im 99/104  soft-tissue]
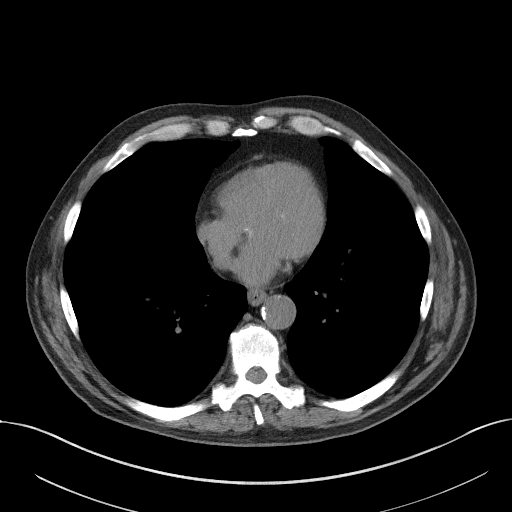
[im 99/104  lung]
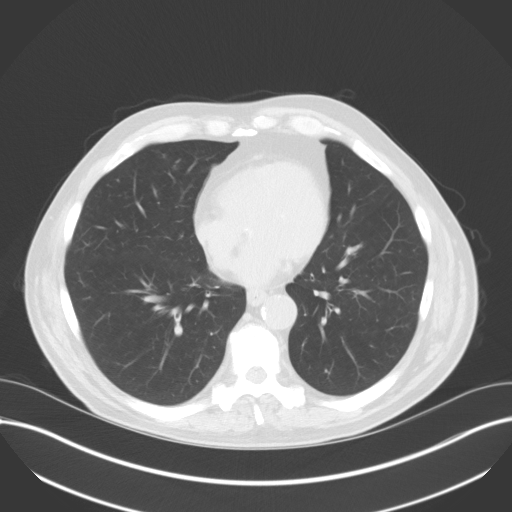

[14 of 32 positions shown; findings below may reference images not displayed]

FINDINGS: Lower chest: Unremarkable

Hepatobiliary: No focal abnormality in the liver on this study
without intravenous contrast. Gallbladder surgically absent. No
intrahepatic or extrahepatic biliary dilation.

Pancreas: No focal mass lesion. No dilatation of the main duct. No
intraparenchymal cyst. No peripancreatic edema.

Spleen: No splenomegaly. No focal mass lesion.

Adrenals/Urinary Tract: No adrenal nodule or mass. No stones are
seen in either kidney or ureter. 4.9 cm exophytic lesion posterior
lower pole left kidney has attenuation too high to be a simple cyst.
Other scattered small hypoattenuating lesions in both kidneys
approach water density and are likely simple cyst. Irregular bladder
wall noted with right bladder diverticulum.

Stomach/Bowel: Stomach is distended with food, fluid, and contrast
material. Duodenum is normally positioned as is the ligament of
Treitz. No small bowel wall thickening. No small bowel dilatation.
The terminal ileum is normal. The appendix is normal. No gross
colonic mass. No colonic wall thickening. Diverticuli are seen
scattered along the entire length of the colon without CT findings
of diverticulitis.

Vascular/Lymphatic: There is moderate calcified aortic
atherosclerosis without aneurysm. There is no gastrohepatic or
hepatoduodenal ligament lymphadenopathy. No retroperitoneal or
mesenteric lymphadenopathy. No pelvic sidewall lymphadenopathy.

Reproductive: The prostate gland and seminal vesicles are
unremarkable.

Other: No intraperitoneal free fluid.

Musculoskeletal: No worrisome lytic or sclerotic osseous
abnormality.
IMPRESSION: 1. 4.9 cm exophytic lesion posterior lower pole left kidney has
attenuation too high to be a simple cyst. While likely a cyst
complicated by proteinaceous debris or hemorrhage, neoplasm could
have this appearance. MRI of the abdomen without and with contrast
recommended to further evaluate.
2. No evidence for urinary stone disease.
3. Irregular bladder wall with right bladder diverticulum. Imaging
features suggest component of bladder outlet obstruction.
4. Aortic Atherosclerosis ([Z9]-[Z9]).

## 2021-05-18 ENCOUNTER — Ambulatory Visit: Payer: Medicare Other | Admitting: Urology

## 2021-05-18 VITALS — BP 132/67 | HR 88 | Ht 71.0 in | Wt 184.0 lb

## 2021-05-18 DIAGNOSIS — N138 Other obstructive and reflux uropathy: Secondary | ICD-10-CM

## 2021-05-18 DIAGNOSIS — R310 Gross hematuria: Secondary | ICD-10-CM | POA: Diagnosis not present

## 2021-05-18 DIAGNOSIS — N184 Chronic kidney disease, stage 4 (severe): Secondary | ICD-10-CM

## 2021-05-18 DIAGNOSIS — N401 Enlarged prostate with lower urinary tract symptoms: Secondary | ICD-10-CM | POA: Diagnosis not present

## 2021-05-18 DIAGNOSIS — N2889 Other specified disorders of kidney and ureter: Secondary | ICD-10-CM

## 2021-05-18 NOTE — Patient Instructions (Signed)

## 2021-05-18 NOTE — Progress Notes (Signed)
05/18/2021 3:54 PM   Tyrone Moore 1950-11-24 [id number]  Referring provider: Glean Hess, MD [address] [place],  [place] [address]  Chief Complaint  Patient presents with   Hematuria    HPI: 70 year old male with personal history of BPH with outlet obstruction and hematuria who returns today for follow-up of CT scan.  Notably, the patient presented for cystoscopy a few weeks ago which time he was noted to have significantly cloudy urine with debris as well as prostamegaly.  Visualization was suboptimal although there was no obvious tumors.  He did not tolerate transrectal ultrasound.  He has chronic obstructive urinary symptoms.  Please see previous notes for details.  Urinalysis has also been chronically positive likely secondary to chronic bacteriuria.  Today he reports that he is actually doing a little bit better after completing antibiotics.  He continues to have significant obstructive symptoms however.  In the interim, he had a noncontrast CT scan which indicates a left 4.9 cm probably cystic renal mass which was incompletely characterized secondary to lack of contrast which could not be given secondary to CKD.  He is followed by Dr. Zollie Scale.  In addition to the above, sequela of chronic outlet obstruction was appreciated including bladder diverticula/saccules and a thickened bladder.  Calculated volume 6.1 x 4.1 x 5.5 cm of 72 cc.  PMH: 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   Chronic bilateral low back pain without sciatica 03/26/2021   S/p 4 lumbar spine surgeries   CKD (chronic kidney disease) stage 4, GFR 15-29 ml/min ([place]) 03/27/2021   Essential hypertension 03/26/2021   Hyperlipidemia    Hypertension    Mixed hyperlipidemia 03/26/2021   Thyroid disease     Surgical History: Past Surgical History:  Procedure Laterality Date   SPINE SURGERY  2015    X 4 surgery - lumbar disc    Home Medications:  Allergies as of 05/18/2021   No Known Allergies      Medication List        Accurate as of May 18, 2021  3:54 PM. If you have any questions, ask your nurse or doctor.          atorvastatin 40 MG tablet Commonly known as: LIPITOR Take 40 mg by mouth daily.   carvedilol 25 MG tablet Commonly known as: COREG Take 25 mg by mouth 2 (two) times daily with a meal.   D3-1000 25 MCG (1000 UT) capsule Generic drug: Cholecalciferol Take 1,000 Units by mouth daily.   levothyroxine 112 MCG tablet Commonly known as: Euthyrox Take 1 tablet (112 mcg total) by mouth daily before breakfast.   lisinopril 10 MG tablet Commonly known as: ZESTRIL Take 10 mg by mouth daily.   Magnesium 500 MG Tabs Take 1 tablet by mouth daily.   MULTIVITAL-M PO Take by mouth.   omeprazole 20 MG capsule Commonly known as: PRILOSEC Take 20 mg by mouth daily.   tamsulosin 0.4 MG Caps capsule Commonly known as: FLOMAX Take 0.8 mg by mouth daily after supper.        Allergies: No Known Allergies  Family History: Family History  Problem Relation Age of Onset   Hypertension Mother    Heart disease Mother    Heart disease Sister    Hypertension Sister    Prostate cancer Neg Hx    Bladder Cancer Neg Hx    Kidney cancer Neg Hx  Social History:  reports that he has been smoking. He has a 81.00 pack-year smoking history. He has never used smokeless tobacco. He reports previous alcohol use. He reports that he does not use drugs.   Physical Exam: BP 132/67   Pulse 88   Ht '5\' 11"'$  (1.803 m)   Wt 184 lb (83.5 kg)   BMI 25.66 kg/m   Constitutional:  Alert and oriented, No acute distress. HEENT: [place] AT, moist mucus membranes.  Trachea midline, no masses. Cardiovascular: No clubbing, cyanosis, or edema. Respiratory: Normal respiratory effort, no increased work of breathing. Skin: No rashes, bruises or suspicious lesions. Neurologic: Grossly  intact, no focal deficits, moving all 4 extremities. Psychiatric: Normal mood and affect.  Laboratory Data: Lab Results  Component Value Date   WBC 7.9 03/26/2021   HGB 12.2 (L) 03/26/2021   HCT 35.8 (L) 03/26/2021   MCV 97 03/26/2021   PLT 186 03/26/2021    Lab Results  Component Value Date   CREATININE 3.25 (H) 03/26/2021      Pertinent Imaging: CLINICAL DATA:  Hematuria.   EXAM: CT ABDOMEN AND PELVIS WITHOUT CONTRAST   TECHNIQUE: Multidetector CT imaging of the abdomen and pelvis was performed following the standard protocol without IV contrast.   COMPARISON:  None.   FINDINGS: Lower chest: Unremarkable   Hepatobiliary: No focal abnormality in the liver on this study without intravenous contrast. Gallbladder surgically absent. No intrahepatic or extrahepatic biliary dilation.   Pancreas: No focal mass lesion. No dilatation of the main duct. No intraparenchymal cyst. No peripancreatic edema.   Spleen: No splenomegaly. No focal mass lesion.   Adrenals/Urinary Tract: No adrenal nodule or mass. No stones are seen in either kidney or ureter. 4.9 cm exophytic lesion posterior lower pole left kidney has attenuation too high to be a simple cyst. Other scattered small hypoattenuating lesions in both kidneys approach water density and are likely simple cyst. Irregular bladder wall noted with right bladder diverticulum.   Stomach/Bowel: Stomach is distended with food, fluid, and contrast material. Duodenum is normally positioned as is the ligament of Treitz. No small bowel wall thickening. No small bowel dilatation. The terminal ileum is normal. The appendix is normal. No gross colonic mass. No colonic wall thickening. Diverticuli are seen scattered along the entire length of the colon without CT findings of diverticulitis.   Vascular/Lymphatic: There is moderate calcified aortic atherosclerosis without aneurysm. There is no gastrohepatic or hepatoduodenal  ligament lymphadenopathy. No retroperitoneal or mesenteric lymphadenopathy. No pelvic sidewall lymphadenopathy.   Reproductive: The prostate gland and seminal vesicles are unremarkable.   Other: No intraperitoneal free fluid.   Musculoskeletal: No worrisome lytic or sclerotic osseous abnormality.   IMPRESSION: 1. 4.9 cm exophytic lesion posterior lower pole left kidney has attenuation too high to be a simple cyst. While likely a cyst complicated by proteinaceous debris or hemorrhage, neoplasm could have this appearance. MRI of the abdomen without and with contrast recommended to further evaluate. 2. No evidence for urinary stone disease. 3. Irregular bladder wall with right bladder diverticulum. Imaging features suggest component of bladder outlet obstruction. 4. Aortic Atherosclerosis (ICD10-I70.0).     Electronically Signed   By: Misty Stanley M.D.   On: 05/18/2021 08:48   Above CT scan images were personally reviewed.  Agree with radiologic interpretation.  Assessment & Plan:    1. Gross hematuria Likely secondary to prostamegaly/chronic bladder inflammation although ultimately visualization was suboptimal and unable to rule out underlying bladder pathology completely although cystoscopy and CT  scan are somewhat reassuring  We discussed that if he proceeds with outlet procedure, we can irrigate his bladder multiple times and get a more accurate assessment of the bladder lining, he will think about this option.  Could also perform bilateral retrogrades at the time of an outlet procedure which allow [place] to evaluate his upper tracts. - MR Abdomen W Wo Contrast; Future  2. BPH with obstruction/lower urinary tract symptoms Chronic poorly controlled outlet obstruction with ongoing urinary symptoms with some slight improvement with Flomax and treatment of his bacteriuria  Strongly recommended consideration of outlet procedure primarily for the bladder preservation, bladder health  as well as preserving his upper tract  Based on the size of his prostate, he was offered TURP versus UroLift versus HoLEP.  I do think given the overall size, he may be best fit for HoLEP.  We reviewed the surgery in detail today including the preoperative, intraoperative, and postoperative course.  This will most likely be an outpatient procedure pending the degree of post op hematuria.  He will go home with catheter for a few days post op and will either be taught how to remove his own catheter or return to the office for catheter removal.  Risk of bleeding, infection, damage surrounding structures, injury to the bladder/ urethral, bladder neck contracture, ureteral stricture, retrograde ejaculation, stress/ urge incontinence, exacerbation of irritative voiding symptoms were all discussed in detail.     He is not sure whether or not he like to proceed.  He will think about it.  He was provided with written literature today.  We will discuss it more with his son and daughter.  3. CKD (chronic kidney disease) stage 4, GFR 15-29 ml/min (HCC) Followed by Dr. Holley Raring  4. Renal mass Incidental nearly 5 cm left lower pole renal lesion, likely a complex cyst although unable to rule out underlying malignancy  I recommended an MRI for further characterization to if she is agreeable.  F/u 6 weeks with MR results  Hollice Espy, MD  [facility] [address] [place], [place] [address] [phone]  I spent 42 total minutes on the day of the encounter including pre-visit review of the medical record, face-to-face time with the patient, and post visit ordering of labs/imaging/tests.

## 2021-05-29 ENCOUNTER — Other Ambulatory Visit: Payer: Self-pay

## 2021-05-29 ENCOUNTER — Ambulatory Visit
Admission: RE | Admit: 2021-05-29 | Discharge: 2021-05-29 | Disposition: A | Discharge status: Home / Self Care | Payer: Medicare Other | Source: Ambulatory Visit | Attending: Urology | Admitting: Urology

## 2021-05-29 DIAGNOSIS — R310 Gross hematuria: Secondary | ICD-10-CM | POA: Diagnosis not present

## 2021-05-29 IMAGING — MR MR ABDOMEN WO/W CM
17 series · 48 of 48 positions shown · IV contrast (8ml Gadavist)
Comparison: Noncontrast abdominopelvic CT [DATE]

CLINICAL DATA: Renal mass on noncontrast CT. Renal insufficiency.
Patient reports painful urination for 3 years.

EXAM:
MRI ABDOMEN WITHOUT AND WITH CONTRAST
TECHNIQUE: Multiplanar multisequence MR imaging of the abdomen was performed
both before and after the administration of intravenous contrast.
CONTRAST:  8mL GADAVIST GADOBUTROL 1 MMOL/ML IV SOLN

[Series 3: T2 · coronal · 6.0mm · 1.19mm/px · 2 of 30 slices shown (1 of 2)]
[im 1/30]
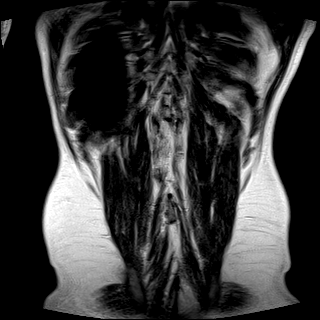
[im 30/30]
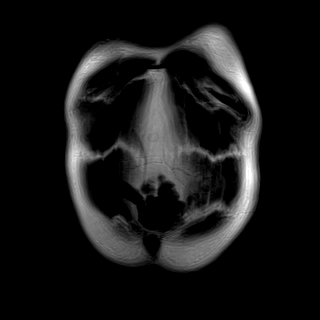

[Series 4: T2 · axial · 6.0mm · 1.19mm/px · z∈[-153,+99]mm · 2 of 36 slices shown (2 of 2)]
[im 1/36]
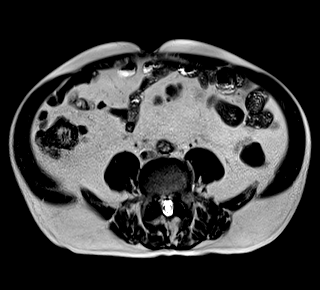
[im 36/36]
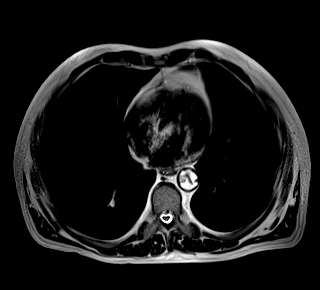

[Series 5: T1 · axial · 3.0mm · 1.19mm/px · z∈[-157,+104]mm · 4 of 88 slices shown (1 of 2)]
[im 1/88]
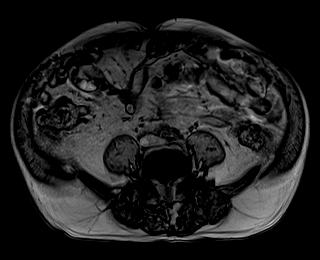
[im 30/88]
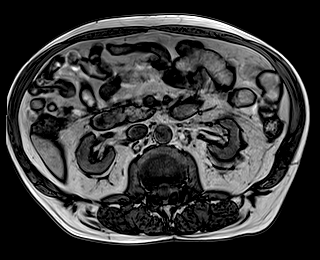
[im 59/88]
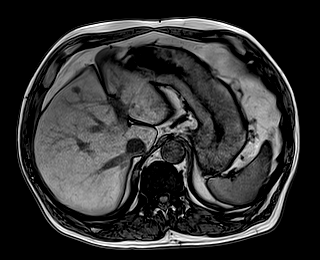
[im 88/88]
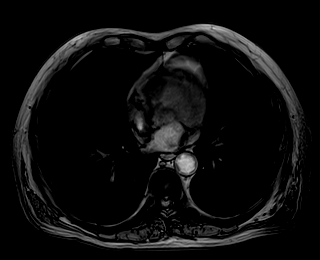

[Series 6: T1 · axial · 3.0mm · 1.19mm/px · z∈[-157,+104]mm · 4 of 88 slices shown (2 of 2)]
[im 1/88]
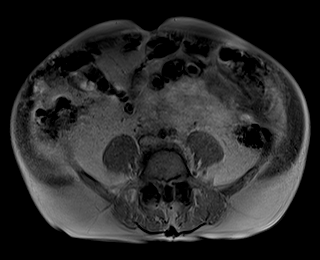
[im 30/88]
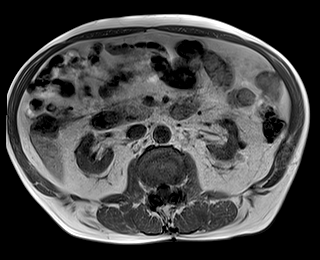
[im 59/88]
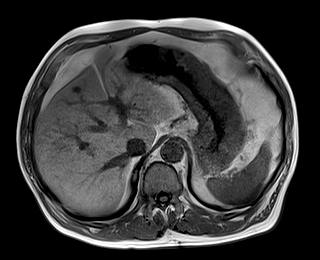
[im 88/88]
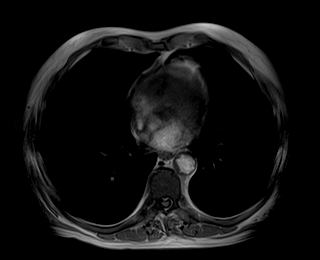

[Series 9: T2 fat-sat · axial · 6.0mm · 1.19mm/px · 1 of 40 slices shown]
[im 1/40]
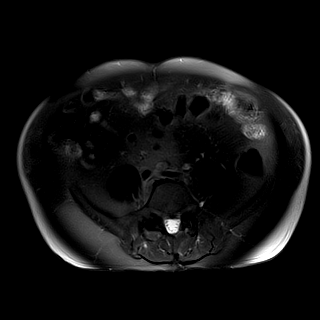

[Series 10: ax dwi_tracew · axial · 6.0mm · 1.42mm/px · z∈[-165,+116]mm · 4 of 120 slices shown]
[im 1/120]
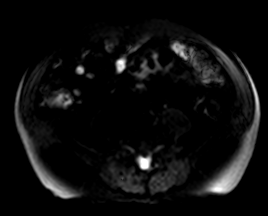
[im 40/120]
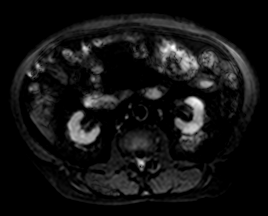
[im 80/120]
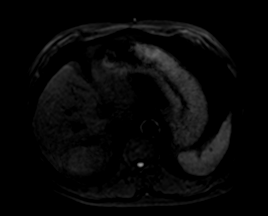
[im 120/120]
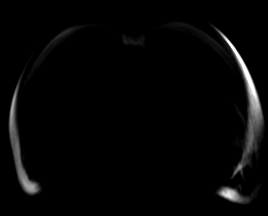

[Series 11: ax dwi_adc · axial · 6.0mm · 1.42mm/px · 1 of 40 slices shown]
[im 1/40]
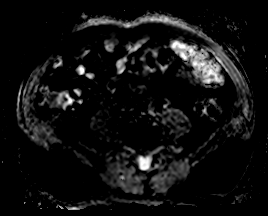

[Series 12: T1 dynamic fat-sat · axial · non-contrast · 3.0mm · 1.19mm/px · z∈[-157,+104]mm · 3 of 88 slices shown (1 of 5)]
[im 1/88]
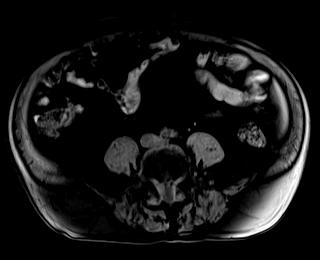
[im 44/88]
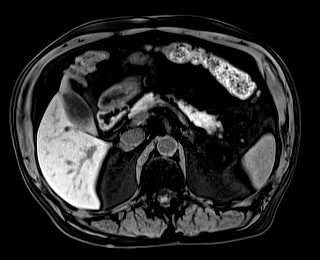
[im 88/88]
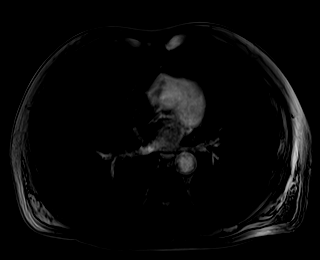

[Series 13: T1 dynamic fat-sat post-contrast · axial · 3.0mm · 1.19mm/px · z∈[-157,+104]mm · 3 of 88 slices shown (1 of 4)]
[im 1/88]
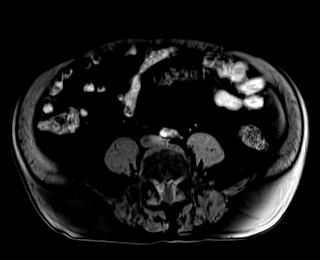
[im 44/88]
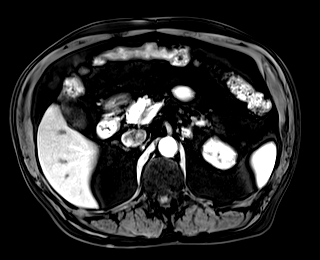
[im 88/88]
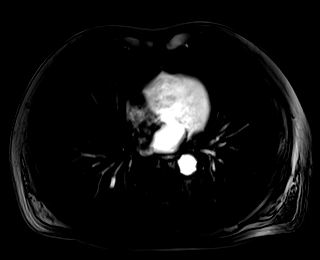

[Series 14: T1 dynamic fat-sat · axial · 3.0mm · 1.19mm/px · z∈[-157,+104]mm · 3 of 88 slices shown (2 of 5)]
[im 1/88]
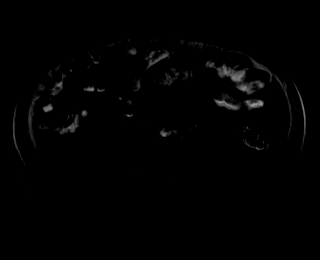
[im 44/88]
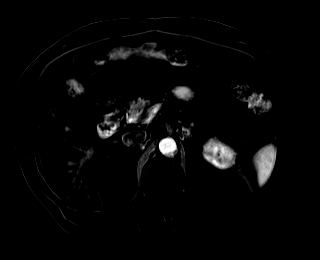
[im 88/88]
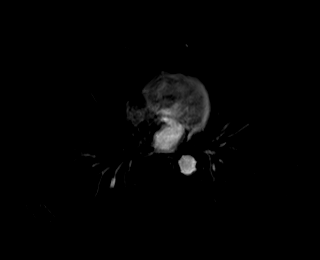

[Series 15: T1 dynamic fat-sat post-contrast · axial · 3.0mm · 1.19mm/px · z∈[-157,+104]mm · 3 of 88 slices shown (2 of 4)]
[im 1/88]
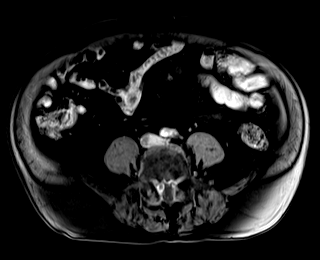
[im 44/88]
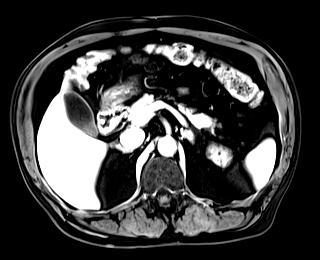
[im 88/88]
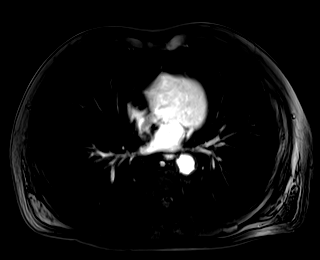

[Series 16: T1 dynamic fat-sat · axial · 3.0mm · 1.19mm/px · z∈[-157,+104]mm · 3 of 88 slices shown (3 of 5)]
[im 1/88]
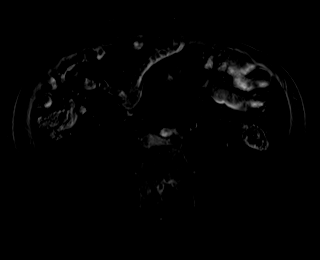
[im 44/88]
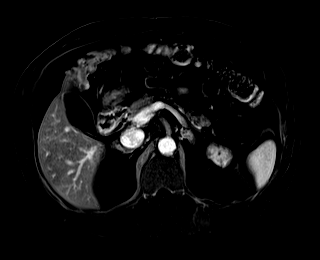
[im 88/88]
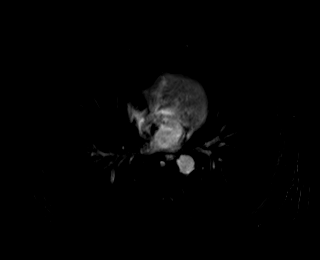

[Series 17: T1 dynamic fat-sat post-contrast · axial · 3.0mm · 1.19mm/px · z∈[-157,+104]mm · 3 of 88 slices shown (3 of 4)]
[im 1/88]
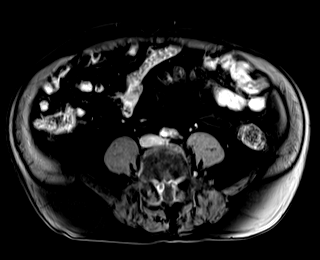
[im 44/88]
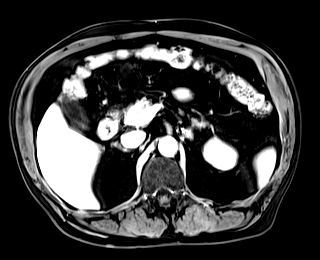
[im 88/88]
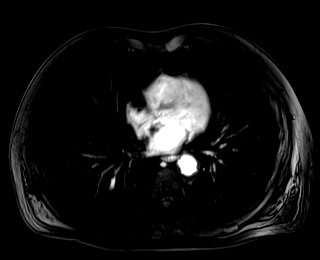

[Series 18: T1 dynamic fat-sat · axial · 3.0mm · 1.19mm/px · z∈[-157,+104]mm · 3 of 88 slices shown (4 of 5)]
[im 1/88]
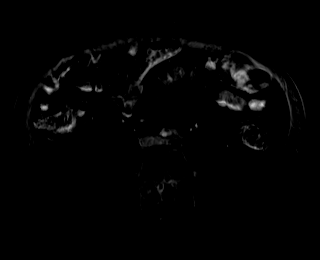
[im 44/88]
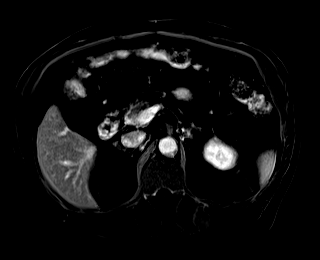
[im 88/88]
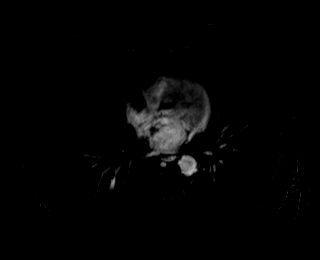

[Series 19: T1 dynamic post-contrast · coronal · 3.0mm · 1.31mm/px · 3 of 72 slices shown]
[im 1/72]
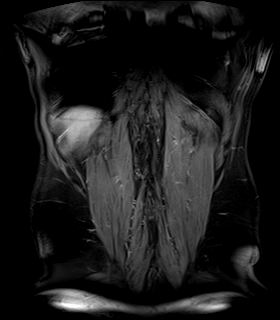
[im 36/72]
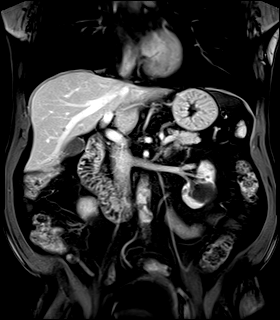
[im 72/72]
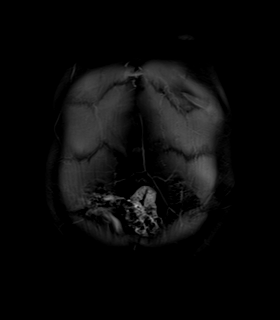

[Series 20: T1 dynamic fat-sat post-contrast · axial · 3.0mm · 1.19mm/px · z∈[-157,+104]mm · 3 of 88 slices shown (4 of 4)]
[im 1/88]
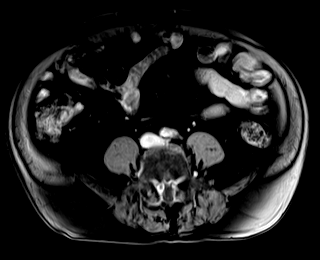
[im 44/88]
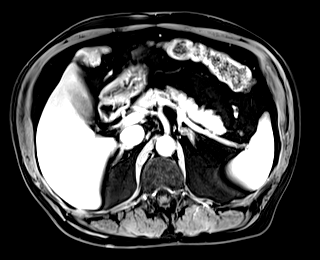
[im 88/88]
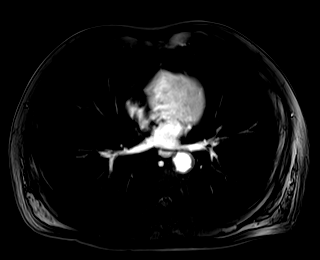

[Series 21: T1 dynamic fat-sat · axial · 3.0mm · 1.19mm/px · z∈[-157,+104]mm · 3 of 88 slices shown (5 of 5)]
[im 1/88]
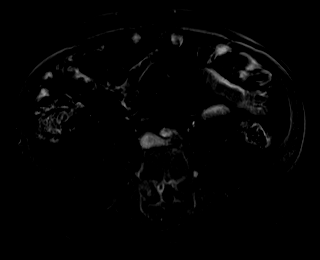
[im 44/88]
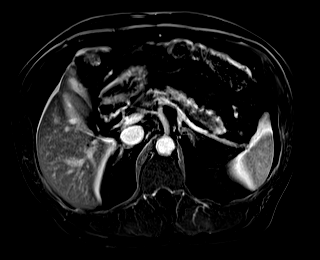
[im 88/88]
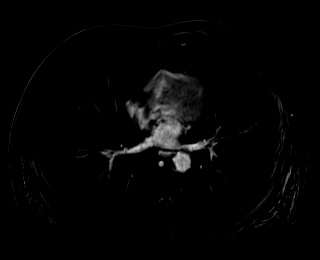

[48 of 48 positions shown; findings below may reference images not displayed]

FINDINGS: Lower chest:  The visualized lower chest appears unremarkable.

Hepatobiliary: There is a 1.1 cm lesion in the dome of the left
hepatic lobe which demonstrates T2 hyperintensity and peripheral
discontinuous enhancement following contrast, consistent with an
incidental hemangioma. There are additional probable tiny
hemangiomas and cysts, but no suspicious liver lesions. No evidence
of gallstones, gallbladder wall thickening or biliary dilatation.

Pancreas: Unremarkable. No pancreatic ductal dilatation or
surrounding inflammatory changes.

Spleen: Normal in size without focal abnormality.

Adrenals/Urinary Tract: Both adrenal glands appear normal. The
lesion of concern in the lower pole of the left kidney appears
cystic, measuring 4.6 x 4.8 x 3.9 cm. This lesion demonstrates high
T2 signal, low T1 signal and no solid enhancement following
contrast. There are thin septations associated with this lesion.
Numerous additional smaller simple renal cysts are present
bilaterally. No evidence of renal mass or hydronephrosis.

Stomach/Bowel: The stomach appears unremarkable for its degree of
distension. No evidence of bowel wall thickening, distention or
surrounding inflammatory change.

Vascular/Lymphatic: There are no enlarged abdominal lymph nodes.
Diffuse aortic atherosclerosis with intimal irregularity, but no
aneurysm or large vessel occlusion.

Other: No evidence of abdominal wall hernia or ascites. Coronal
images demonstrate multifocal bladder trabeculation.

Musculoskeletal: No acute or significant osseous findings. There are
postsurgical changes in the lower lumbar spine with possible
peripherally enhancing synovial cysts bilaterally at L4-5, best seen
on coronal image 13/19 (assuming a transitional S1 level). These
could contribute to nerve root encroachment in the canal.
IMPRESSION: 1. The lesion of concern on previous noncontrast abdominal CT
appears cystic and minimally septated, consistent with a Bosniak 2
lesion. No suspicious renal mass.
2. Additional simple renal cysts bilaterally.
3. Scattered hepatic hemangiomas and cysts.
4. Bladder trabeculation.
5.  Aortic Atherosclerosis ([GU]-[GU]).

## 2021-05-29 MED ORDER — GADOBUTROL 1 MMOL/ML IV SOLN
8.0000 mL | Freq: Once | INTRAVENOUS | Status: AC | PRN
  Administered 2021-05-29: 8 mL via INTRAVENOUS

## 2021-06-11 DIAGNOSIS — Z01810 Encounter for preprocedural cardiovascular examination: Secondary | ICD-10-CM | POA: Diagnosis not present

## 2021-06-12 ENCOUNTER — Other Ambulatory Visit: Payer: Self-pay

## 2021-06-12 DIAGNOSIS — N138 Other obstructive and reflux uropathy: Secondary | ICD-10-CM

## 2021-06-21 ENCOUNTER — Other Ambulatory Visit: Payer: Self-pay

## 2021-06-21 ENCOUNTER — Encounter
Admission: RE | Admit: 2021-06-21 | Discharge: 2021-06-21 | Disposition: A | Discharge status: Home / Self Care | Payer: Medicare Other | Source: Ambulatory Visit | Attending: Urology | Admitting: Urology

## 2021-06-21 NOTE — Patient Instructions (Addendum)
Your procedure is scheduled on: Tues 8/23 Report to registration then to second floor desk Day Surgery. To find out your arrival time please call [phone] between 1PM - 3PM on Mon 8/22.  Remember: Instructions that are not followed completely may result in serious medical risk,  up to and including death, or upon the discretion of your surgeon and anesthesiologist your  surgery may need to be rescheduled.     _X__ 1. Do not eat food after midnight the night before your procedure.                 No chewing gum or hard candies. You may drink clear liquids up to 2 hours                 before you are scheduled to arrive for your surgery- DO not drink clear                 liquids within 2 hours of the start of your surgery.                 Clear Liquids include:  water, apple juice without pulp, clear Gatorade, G2 or                  Gatorade Zero (avoid Red/Purple/Blue), Black Coffee or Tea (Do not add                 anything to coffee or tea). _____2.   Complete the "Ensure Clear Pre-surgery Clear Carbohydrate Drink"provided to you, 2 hours before arrival. **If you are diabetic you will be provided with an alternative drink, Gatorade Zero or G2.  __X__2.  On the morning of surgery brush your teeth with toothpaste and water, you                may rinse your mouth with mouthwash if you wish.  Do not swallow any  toothpaste of mouthwash.     ___ 3.  No Alcohol for 24 hours before or after surgery.   _X__ 4.  Do Not Smoke or use e-cigarettes For 24 Hours Prior to Your Surgery.                 Do not use any chewable tobacco products for at least 6 hours prior to                 Surgery.  ___  5.  Do not use any recreational drugs (marijuana, cocaine, heroin, ecstasy,       MDMA or other) For at least one week prior to your surgery.            Combination of these drugs with anesthesia may have life threatening       results.  ____  6.  Bring all medications with  you on the day of surgery if instructed.   __x__  7.  Notify your doctor if there is any change in your medical condition      (cold, fever, infections).     Do not wear jewelry,  Do not wear lotions,  You may wear deodorant. Do not shave 48 hours prior to surgery. Men may shave face and neck. Do not bring valuables to the hospital.    [facility] is not responsible for any belongings or valuables.  Contacts, dentures or bridgework may not be worn into surgery. Leave your suitcase in the car. After surgery it may be brought to your room. For patients  admitted to the hospital, discharge time is determined by your treatment team.   Patients discharged the day of surgery will not be allowed to drive home.   Make arrangements for someone to be with you for the first 24 hours of your Same Day Discharge.    Please read over the following fact sheets that you were given:      __x__ Take these medicines the morning of surgery with A SIP OF WATER:    1. acetaminophen (TYLENOL) 500 MG tablet if needed  2. carvedilol (COREG) 25 MG tablet  3. levothyroxine (EUTHYROX) 112 MCG tablet  4.omeprazole (PRILOSEC) 20 MG capsule take dose the night before and the morning of surgery  5.  6.  ____ Fleet Enema (as directed)   _x_       shower the night and morning of surgery with your usual products  ____ Use Benzoyl Peroxide Gel as instructed  ____ Use inhalers on the day of surgery  ____ Stop metformin 2 days prior to surgery    ____ Take 1/2 of usual insulin dose the night before surgery. No insulin the morning          of surgery.   ____ Stop Coumadin/Plavix/aspirin on   ____ Stop Anti-inflammatories on    ____ Stop supplements until after surgery.    ____ Bring C-Pap to the hospital.    If you have any questions regarding your pre-procedure instructions,  Please call Pre-admit Testing at [phone]

## 2021-06-25 ENCOUNTER — Other Ambulatory Visit: Payer: Self-pay

## 2021-06-25 ENCOUNTER — Encounter
Admission: RE | Admit: 2021-06-25 | Discharge: 2021-06-25 | Disposition: A | Discharge status: Home / Self Care | Payer: Medicare Other | Source: Ambulatory Visit | Attending: Urology | Admitting: Urology

## 2021-06-25 DIAGNOSIS — Z01810 Encounter for preprocedural cardiovascular examination: Secondary | ICD-10-CM | POA: Diagnosis not present

## 2021-06-26 ENCOUNTER — Ambulatory Visit: Payer: Medicare Other | Admitting: Internal Medicine

## 2021-06-26 NOTE — Progress Notes (Deleted)
    Date:  06/26/2021   Name:  Tyrone Moore   DOB:  February 09, 1951   MRN:  [id number]   Chief Complaint: No chief complaint on file.  HPI  Lab Results  Component Value Date   CREATININE 3.25 (H) 03/26/2021   BUN 39 (H) 03/26/2021   NA 143 03/26/2021   K 5.3 (H) 03/26/2021   CL 111 (H) 03/26/2021   CO2 18 (L) 03/26/2021   Lab Results  Component Value Date   CHOL 129 03/26/2021   HDL 32 (L) 03/26/2021   LDLCALC 70 03/26/2021   TRIG 157 (H) 03/26/2021   CHOLHDL 4.0 03/26/2021   Lab Results  Component Value Date   TSH 0.712 03/26/2021   No results found for: HGBA1C Lab Results  Component Value Date   WBC 7.9 03/26/2021   HGB 12.2 (L) 03/26/2021   HCT 35.8 (L) 03/26/2021   MCV 97 03/26/2021   PLT 186 03/26/2021   Lab Results  Component Value Date   ALT 14 03/26/2021   AST 18 03/26/2021   ALKPHOS 149 (H) 03/26/2021   BILITOT 0.3 03/26/2021     Review of Systems  Patient Active Problem List   Diagnosis Date Noted   Proteinuria, unspecified 05/16/2021   CKD (chronic kidney disease) stage 4, GFR 15-29 ml/min (HCC) 03/27/2021   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   Mixed hyperlipidemia 03/26/2021   Acquired hypothyroidism 03/26/2021   Essential hypertension 03/26/2021   Tobacco use disorder 03/26/2021   Chronic bilateral low back pain without sciatica 03/26/2021    No Known Allergies  Past Surgical History:  Procedure Laterality Date   SPINE SURGERY  2015   X 4 surgery - lumbar disc    Social History   Tobacco Use   Smoking status: Every Day    Packs/day: 1.50    Years: 54.00    Pack years: 81.00    Types: Cigarettes   Smokeless tobacco: Never  Vaping Use   Vaping Use: Never used  Substance Use Topics   Alcohol use: Not Currently    Comment: quit drinking at 19   Drug use: Never     Medication list has been reviewed and updated.  No outpatient medications have been marked as taking for the 06/26/21 encounter  (Appointment) with Glean Hess, MD.    [facility] 2/9 Scores 03/26/2021  PHQ - 2 Score 1  PHQ- 9 Score 9    GAD 7 : Generalized Anxiety Score 03/26/2021  Nervous, Anxious, on Edge 0  Control/stop worrying 0  Worry too much - different things 0  Trouble relaxing 0  Restless 0  Easily annoyed or irritable 0  Afraid - awful might happen 0  Total GAD 7 Score 0  Anxiety Difficulty Not difficult at all    BP Readings from Last 3 Encounters:  05/18/21 132/67  05/04/21 116/65  04/06/21 132/66    Physical Exam  Wt Readings from Last 3 Encounters:  06/21/21 184 lb 1.4 oz (83.5 kg)  05/18/21 184 lb (83.5 kg)  05/04/21 208 lb (94.3 kg)    There were no vitals taken for this visit.  Assessment and Plan:

## 2021-06-29 ENCOUNTER — Encounter: Payer: Self-pay | Admitting: Urology

## 2021-06-29 ENCOUNTER — Other Ambulatory Visit
Admission: RE | Admit: 2021-06-29 | Discharge: 2021-06-29 | Disposition: A | Discharge status: Home / Self Care | Payer: Medicare Other | Attending: Urology | Admitting: Urology

## 2021-06-29 ENCOUNTER — Ambulatory Visit: Payer: Medicare Other | Admitting: Urology

## 2021-06-29 ENCOUNTER — Other Ambulatory Visit: Payer: Self-pay

## 2021-06-29 VITALS — BP 128/70 | HR 77 | Ht 71.0 in | Wt 192.0 lb

## 2021-06-29 DIAGNOSIS — N401 Enlarged prostate with lower urinary tract symptoms: Secondary | ICD-10-CM | POA: Diagnosis not present

## 2021-06-29 DIAGNOSIS — N138 Other obstructive and reflux uropathy: Secondary | ICD-10-CM

## 2021-06-29 DIAGNOSIS — N281 Cyst of kidney, acquired: Secondary | ICD-10-CM | POA: Diagnosis not present

## 2021-06-29 DIAGNOSIS — N2889 Other specified disorders of kidney and ureter: Secondary | ICD-10-CM | POA: Diagnosis not present

## 2021-06-29 DIAGNOSIS — R310 Gross hematuria: Secondary | ICD-10-CM | POA: Diagnosis not present

## 2021-06-29 DIAGNOSIS — N184 Chronic kidney disease, stage 4 (severe): Secondary | ICD-10-CM

## 2021-06-29 LAB — CBC WITH DIFFERENTIAL/PLATELET
Abs Immature Granulocytes: 0.03 10*3/uL (ref 0.00–0.07)
Basophils Absolute: 0.1 10*3/uL (ref 0.0–0.1)
Basophils Relative: 1 %
Eosinophils Absolute: 0.6 10*3/uL — ABNORMAL HIGH (ref 0.0–0.5)
Eosinophils Relative: 8 %
HCT: 37.1 % — ABNORMAL LOW (ref 39.0–52.0)
Hemoglobin: 12.4 g/dL — ABNORMAL LOW (ref 13.0–17.0)
Immature Granulocytes: 0 %
Lymphocytes Relative: 28 %
Lymphs Abs: 2.2 10*3/uL (ref 0.7–4.0)
MCH: 32.5 pg (ref 26.0–34.0)
MCHC: 33.4 g/dL (ref 30.0–36.0)
MCV: 97.1 fL (ref 80.0–100.0)
Monocytes Absolute: 0.6 10*3/uL (ref 0.1–1.0)
Monocytes Relative: 8 %
Neutro Abs: 4.2 10*3/uL (ref 1.7–7.7)
Neutrophils Relative %: 55 %
Platelets: 180 10*3/uL (ref 150–400)
RBC: 3.82 MIL/uL — ABNORMAL LOW (ref 4.22–5.81)
RDW: 13.2 % (ref 11.5–15.5)
WBC: 7.7 10*3/uL (ref 4.0–10.5)
nRBC: 0 % (ref 0.0–0.2)

## 2021-06-29 LAB — BASIC METABOLIC PANEL
Anion gap: 6 (ref 5–15)
BUN: 37 mg/dL — ABNORMAL HIGH (ref 8–23)
CO2: 21 mmol/L — ABNORMAL LOW (ref 22–32)
Calcium: 8.9 mg/dL (ref 8.9–10.3)
Chloride: 111 mmol/L (ref 98–111)
Creatinine, Ser: 3.19 mg/dL — ABNORMAL HIGH (ref 0.61–1.24)
GFR, Estimated: 20 mL/min — ABNORMAL LOW (ref >60)
Glucose, Bld: 83 mg/dL (ref 70–99)
Potassium: 4.8 mmol/L (ref 3.5–5.1)
Sodium: 138 mmol/L (ref 135–145)

## 2021-06-29 LAB — URINALYSIS, COMPLETE (UACMP) WITH MICROSCOPIC
Bilirubin Urine: NEGATIVE
Glucose, UA: NEGATIVE mg/dL
Ketones, ur: NEGATIVE mg/dL
Leukocytes,Ua: NEGATIVE
Nitrite: NEGATIVE
Protein, ur: 100 mg/dL — AB
Specific Gravity, Urine: 1.02 (ref 1.005–1.030)
pH: 5.5 (ref 5.0–8.0)

## 2021-06-29 NOTE — Progress Notes (Signed)
06/29/2021 3:45 PM   Tyrone Moore 27-Jan-1951 [id number]  Referring provider: Glean Hess, MD [address] [place],  [place] [address]  Chief Complaint  Patient presents with   Results    6wk follow up w/MRI results    HPI: 70 year old male with partially of chronic outlet obstruction, CKD and left lower pole renal mass returns for follow-up of MRI.  In terms of his obstructive urinary symptoms, these are poorly controlled.  He underwent cystoscopy which showed a heavily trabeculated bladder and evidence of outlet obstruction.  Prostate volume 72 g.  He is elected to undergo follow-up as scheduled for this next week.  Notably today, he mentions a preoperative appointment with only an EKG was checked.  This was noted to have some abnormal ST changes.  He did not have labs or his preoperative urine culture/UA.  He follows up today with MRI for further characterization of his left lower pole abdominal cyst.  This shows a septated cyst consistent with Bosniak 2. Max dimension 4.6 cm.   PMH: 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   Chronic bilateral low back pain without sciatica 03/26/2021   S/p 4 lumbar spine surgeries   CKD (chronic kidney disease) stage 4, GFR 15-29 ml/min ([place]) 03/27/2021   Essential hypertension 03/26/2021   Hyperlipidemia    Hypertension    Mixed hyperlipidemia 03/26/2021   Thyroid disease     Surgical History: Past Surgical History:  Procedure Laterality Date   SPINE SURGERY  2015   X 4 surgery - lumbar disc    Home Medications:  Allergies as of 06/29/2021   No Known Allergies      Medication List        Accurate as of June 29, 2021  3:45 PM. If you have any questions, ask your nurse or doctor.          acetaminophen 500 MG tablet Commonly known as: TYLENOL Take 1,000 mg by mouth every 6 (six) hours as needed for moderate  pain.   atorvastatin 40 MG tablet Commonly known as: LIPITOR Take 40 mg by mouth daily.   carvedilol 25 MG tablet Commonly known as: COREG Take 25 mg by mouth 2 (two) times daily with a meal.   D3-1000 25 MCG (1000 UT) capsule Generic drug: Cholecalciferol Take 2,000 Units by mouth daily.   levothyroxine 112 MCG tablet Commonly known as: Euthyrox Take 1 tablet (112 mcg total) by mouth daily before breakfast.   lisinopril 10 MG tablet Commonly known as: ZESTRIL Take 10 mg by mouth daily.   MAGNESIUM PO Take 1,000 mg by mouth daily.   MULTIVITAL-M PO Take 1 tablet by mouth daily.   omeprazole 20 MG capsule Commonly known as: PRILOSEC Take 20 mg by mouth daily.   tamsulosin 0.4 MG Caps capsule Commonly known as: FLOMAX Take 0.8 mg by mouth daily after supper.        Allergies: No Known Allergies  Family History: Family History  Problem Relation Age of Onset   Hypertension Mother    Heart disease Mother    Heart disease Sister    Hypertension Sister    Prostate cancer Neg Hx    Bladder Cancer Neg Hx    Kidney cancer Neg Hx     Social History:  reports that he has been smoking cigarettes. He has a 81.00 pack-year smoking history. He has never used smokeless tobacco.  He reports that he does not currently use alcohol. He reports that he does not use drugs.   Physical Exam: BP 128/70   Pulse 77   Ht '5\' 11"'$  (1.803 m)   Wt 192 lb (87.1 kg)   BMI 26.78 kg/m   Constitutional:  Alert and oriented, No acute distress.  Accompanied by his daughter today. Neurologic: Grossly intact, no focal deficits, moving all 4 extremities. Psychiatric: Normal mood and affect.  Laboratory Data: Lab Results  Component Value Date   WBC 7.9 03/26/2021   HGB 12.2 (L) 03/26/2021   HCT 35.8 (L) 03/26/2021   MCV 97 03/26/2021   PLT 186 03/26/2021    Lab Results  Component Value Date   CREATININE 3.25 (H) 03/26/2021     Pertinent Imaging: IMPRESSION: 1. The lesion of  concern on previous noncontrast abdominal CT appears cystic and minimally septated, consistent with a Bosniak 2 lesion. No suspicious renal mass. 2. Additional simple renal cysts bilaterally. 3. Scattered hepatic hemangiomas and cysts. 4. Bladder trabeculation. 5.  Aortic Atherosclerosis (ICD10-I70.0).     Electronically Signed   By: Richardean Sale M.D.   On: 05/30/2021 10:50  MRI was personally reviewed today.  Agree with radiologic interpretation.  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  Fairly low risk of malignancy, septated cyst without any enhancement.  We will continue to follow this with serial renal ultrasound annually.  He is agreeable this plan.  - Ultrasound renal complete; Future - Urine Culture; Future - Urinalysis, Complete w Microscopic; Future - CBC with Differential/Platelet; Future - Basic metabolic panel; Future  2. BPH with obstruction/lower urinary tract symptoms Scheduled for HoLEP  Reached out to Overton Mam, NP for preoperative testing.  He does not fact need labs and UA/urine culture for which she was sent today.  EKG is okay.  All additional questions about surgery answered today.  3. CKD (chronic kidney disease) stage 4, GFR 15-29 ml/min (HCC) Unclear whether #2 is contributing factor  4. Gross hematuria Will add bilateral retrogrades to procedure this week for completeness to evaluate upper tract tracts    Hollice Espy, MD  [facility] [address] [place], [place] [address] [phone]  I spent 35 total minutes on the day of the encounter including pre-visit review of the medical record, face-to-face time with the patient, and post visit ordering of labs/imaging/tests.

## 2021-06-29 NOTE — H&P (View-Only) (Signed)
06/29/2021 3:45 PM   Tyrone Moore 06-Jun-1951 [id number]  Referring provider: Glean Hess, MD [address] [place],  [place] [address]  Chief Complaint  Patient presents with   Results    6wk follow up w/MRI results    HPI: 70 year old male with partially of chronic outlet obstruction, CKD and left lower pole renal mass returns for follow-up of MRI.  In terms of his obstructive urinary symptoms, these are poorly controlled.  He underwent cystoscopy which showed a heavily trabeculated bladder and evidence of outlet obstruction.  Prostate volume 72 g.  He is elected to undergo follow-up as scheduled for this next week.  Notably today, he mentions a preoperative appointment with only an EKG was checked.  This was noted to have some abnormal ST changes.  He did not have labs or his preoperative urine culture/UA.  He follows up today with MRI for further characterization of his left lower pole abdominal cyst.  This shows a septated cyst consistent with Bosniak 2. Max dimension 4.6 cm.   PMH: 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   Chronic bilateral low back pain without sciatica 03/26/2021   S/p 4 lumbar spine surgeries   CKD (chronic kidney disease) stage 4, GFR 15-29 ml/min ([place]) 03/27/2021   Essential hypertension 03/26/2021   Hyperlipidemia    Hypertension    Mixed hyperlipidemia 03/26/2021   Thyroid disease     Surgical History: Past Surgical History:  Procedure Laterality Date   SPINE SURGERY  2015   X 4 surgery - lumbar disc    Home Medications:  Allergies as of 06/29/2021   No Known Allergies      Medication List        Accurate as of June 29, 2021  3:45 PM. If you have any questions, ask your nurse or doctor.          acetaminophen 500 MG tablet Commonly known as: TYLENOL Take 1,000 mg by mouth every 6 (six) hours as needed for moderate  pain.   atorvastatin 40 MG tablet Commonly known as: LIPITOR Take 40 mg by mouth daily.   carvedilol 25 MG tablet Commonly known as: COREG Take 25 mg by mouth 2 (two) times daily with a meal.   D3-1000 25 MCG (1000 UT) capsule Generic drug: Cholecalciferol Take 2,000 Units by mouth daily.   levothyroxine 112 MCG tablet Commonly known as: Euthyrox Take 1 tablet (112 mcg total) by mouth daily before breakfast.   lisinopril 10 MG tablet Commonly known as: ZESTRIL Take 10 mg by mouth daily.   MAGNESIUM PO Take 1,000 mg by mouth daily.   MULTIVITAL-M PO Take 1 tablet by mouth daily.   omeprazole 20 MG capsule Commonly known as: PRILOSEC Take 20 mg by mouth daily.   tamsulosin 0.4 MG Caps capsule Commonly known as: FLOMAX Take 0.8 mg by mouth daily after supper.        Allergies: No Known Allergies  Family History: Family History  Problem Relation Age of Onset   Hypertension Mother    Heart disease Mother    Heart disease Sister    Hypertension Sister    Prostate cancer Neg Hx    Bladder Cancer Neg Hx    Kidney cancer Neg Hx     Social History:  reports that he has been smoking cigarettes. He has a 81.00 pack-year smoking history. He has never used smokeless tobacco.  He reports that he does not currently use alcohol. He reports that he does not use drugs.   Physical Exam: BP 128/70   Pulse 77   Ht '5\' 11"'$  (1.803 m)   Wt 192 lb (87.1 kg)   BMI 26.78 kg/m   Constitutional:  Alert and oriented, No acute distress.  Accompanied by his daughter today. Neurologic: Grossly intact, no focal deficits, moving all 4 extremities. Psychiatric: Normal mood and affect.  Laboratory Data: Lab Results  Component Value Date   WBC 7.9 03/26/2021   HGB 12.2 (L) 03/26/2021   HCT 35.8 (L) 03/26/2021   MCV 97 03/26/2021   PLT 186 03/26/2021    Lab Results  Component Value Date   CREATININE 3.25 (H) 03/26/2021     Pertinent Imaging: IMPRESSION: 1. The lesion of  concern on previous noncontrast abdominal CT appears cystic and minimally septated, consistent with a Bosniak 2 lesion. No suspicious renal mass. 2. Additional simple renal cysts bilaterally. 3. Scattered hepatic hemangiomas and cysts. 4. Bladder trabeculation. 5.  Aortic Atherosclerosis (ICD10-I70.0).     Electronically Signed   By: Richardean Sale M.D.   On: 05/30/2021 10:50  MRI was personally reviewed today.  Agree with radiologic interpretation.  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  Fairly low risk of malignancy, septated cyst without any enhancement.  We will continue to follow this with serial renal ultrasound annually.  He is agreeable this plan.  - Ultrasound renal complete; Future - Urine Culture; Future - Urinalysis, Complete w Microscopic; Future - CBC with Differential/Platelet; Future - Basic metabolic panel; Future  2. BPH with obstruction/lower urinary tract symptoms Scheduled for HoLEP  Reached out to Overton Mam, NP for preoperative testing.  He does not fact need labs and UA/urine culture for which she was sent today.  EKG is okay.  All additional questions about surgery answered today.  3. CKD (chronic kidney disease) stage 4, GFR 15-29 ml/min (HCC) Unclear whether #2 is contributing factor  4. Gross hematuria Will add bilateral retrogrades to procedure this week for completeness to evaluate upper tract tracts    Hollice Espy, MD  [facility] [address] [place], [place] [address] [phone]  I spent 35 total minutes on the day of the encounter including pre-visit review of the medical record, face-to-face time with the patient, and post visit ordering of labs/imaging/tests.

## 2021-07-02 ENCOUNTER — Other Ambulatory Visit: Payer: Self-pay | Admitting: Urology

## 2021-07-02 ENCOUNTER — Ambulatory Visit: Payer: Medicare Other

## 2021-07-02 LAB — URINE CULTURE: Culture: <10000 — AB

## 2021-07-03 ENCOUNTER — Ambulatory Visit: Payer: Medicare Other | Admitting: Urgent Care

## 2021-07-03 ENCOUNTER — Ambulatory Visit: Payer: Medicare Other

## 2021-07-03 ENCOUNTER — Encounter: Payer: Self-pay | Admitting: Urology

## 2021-07-03 ENCOUNTER — Ambulatory Visit
Admission: RE | Admit: 2021-07-03 | Discharge: 2021-07-03 | Disposition: A | Discharge status: Home / Self Care | Payer: Medicare Other | Attending: Urology | Admitting: Urology

## 2021-07-03 ENCOUNTER — Other Ambulatory Visit: Payer: Self-pay

## 2021-07-03 ENCOUNTER — Encounter
Admission: RE | Disposition: A | Discharge status: Home / Self Care | Payer: Self-pay | Source: Home / Self Care | Attending: Urology

## 2021-07-03 DIAGNOSIS — R310 Gross hematuria: Secondary | ICD-10-CM | POA: Diagnosis not present

## 2021-07-03 DIAGNOSIS — N289 Disorder of kidney and ureter, unspecified: Secondary | ICD-10-CM | POA: Diagnosis not present

## 2021-07-03 DIAGNOSIS — Z79899 Other long term (current) drug therapy: Secondary | ICD-10-CM | POA: Diagnosis not present

## 2021-07-03 DIAGNOSIS — N320 Bladder-neck obstruction: Secondary | ICD-10-CM | POA: Insufficient documentation

## 2021-07-03 DIAGNOSIS — E782 Mixed hyperlipidemia: Secondary | ICD-10-CM | POA: Diagnosis not present

## 2021-07-03 DIAGNOSIS — I129 Hypertensive chronic kidney disease with stage 1 through stage 4 chronic kidney disease, or unspecified chronic kidney disease: Secondary | ICD-10-CM | POA: Insufficient documentation

## 2021-07-03 DIAGNOSIS — N138 Other obstructive and reflux uropathy: Secondary | ICD-10-CM | POA: Diagnosis not present

## 2021-07-03 DIAGNOSIS — N184 Chronic kidney disease, stage 4 (severe): Secondary | ICD-10-CM | POA: Diagnosis not present

## 2021-07-03 DIAGNOSIS — Z8249 Family history of ischemic heart disease and other diseases of the circulatory system: Secondary | ICD-10-CM | POA: Diagnosis not present

## 2021-07-03 DIAGNOSIS — F17210 Nicotine dependence, cigarettes, uncomplicated: Secondary | ICD-10-CM | POA: Diagnosis not present

## 2021-07-03 DIAGNOSIS — N401 Enlarged prostate with lower urinary tract symptoms: Secondary | ICD-10-CM | POA: Diagnosis not present

## 2021-07-03 HISTORY — PX: HOLEP-LASER ENUCLEATION OF THE PROSTATE WITH MORCELLATION: SHX6641

## 2021-07-03 HISTORY — PX: CYSTOSCOPY WITH LITHOLAPAXY: SHX1425

## 2021-07-03 HISTORY — PX: CYSTOSCOPY W/ RETROGRADES: SHX1426

## 2021-07-03 SURGERY — ENUCLEATION, PROSTATE, USING LASER, WITH MORCELLATION
Anesthesia: General

## 2021-07-03 MED ORDER — DEXAMETHASONE SODIUM PHOSPHATE 10 MG/ML IJ SOLN
INTRAMUSCULAR | Status: AC
  Filled 2021-07-03: qty 1

## 2021-07-03 MED ORDER — EPHEDRINE 5 MG/ML INJ
INTRAVENOUS | Status: AC
  Filled 2021-07-03: qty 5

## 2021-07-03 MED ORDER — ORAL CARE MOUTH RINSE: 15.0000 mL | Freq: Once | OROMUCOSAL | Status: AC

## 2021-07-03 MED ORDER — EPHEDRINE SULFATE 50 MG/ML IJ SOLN
INTRAMUSCULAR | Status: DC | PRN
  Administered 2021-07-03: 10 mg via INTRAVENOUS
  Administered 2021-07-03: 5 mg via INTRAVENOUS
  Administered 2021-07-03: 10 mg via INTRAVENOUS

## 2021-07-03 MED ORDER — FUROSEMIDE 10 MG/ML IJ SOLN
INTRAMUSCULAR | Status: DC | PRN
  Administered 2021-07-03: 10 mg via INTRAVENOUS

## 2021-07-03 MED ORDER — SUGAMMADEX SODIUM 200 MG/2ML IV SOLN
INTRAVENOUS | Status: DC | PRN
  Administered 2021-07-03: 200 mg via INTRAVENOUS

## 2021-07-03 MED ORDER — ACETAMINOPHEN 10 MG/ML IV SOLN
INTRAVENOUS | Status: AC
  Filled 2021-07-03: qty 100

## 2021-07-03 MED ORDER — OXYCODONE HCL 5 MG/5ML PO SOLN: 5.0000 mg | Freq: Once | ORAL | Status: AC | PRN

## 2021-07-03 MED ORDER — ACETAMINOPHEN 10 MG/ML IV SOLN
INTRAVENOUS | Status: DC | PRN
  Administered 2021-07-03: 1000 mg via INTRAVENOUS

## 2021-07-03 MED ORDER — ROCURONIUM BROMIDE 10 MG/ML (PF) SYRINGE
PREFILLED_SYRINGE | INTRAVENOUS | Status: AC
  Filled 2021-07-03: qty 10

## 2021-07-03 MED ORDER — ROCURONIUM BROMIDE 100 MG/10ML IV SOLN
INTRAVENOUS | Status: DC | PRN
  Administered 2021-07-03: 70 mg via INTRAVENOUS

## 2021-07-03 MED ORDER — OXYBUTYNIN CHLORIDE 5 MG PO TABS: 5.0000 mg | ORAL_TABLET | Freq: Three times a day (TID) | ORAL | 0 refills | Status: DC | PRN

## 2021-07-03 MED ORDER — CEFAZOLIN SODIUM-DEXTROSE 2-4 GM/100ML-% IV SOLN
2.0000 g | INTRAVENOUS | Status: AC
  Administered 2021-07-03: 2 g via INTRAVENOUS

## 2021-07-03 MED ORDER — FENTANYL CITRATE (PF) 100 MCG/2ML IJ SOLN
INTRAMUSCULAR | Status: DC | PRN
  Administered 2021-07-03 (×3): 50 ug via INTRAVENOUS

## 2021-07-03 MED ORDER — VASOPRESSIN 20 UNIT/ML IV SOLN
INTRAVENOUS | Status: AC
  Filled 2021-07-03: qty 1

## 2021-07-03 MED ORDER — SODIUM CHLORIDE 0.9 % IR SOLN
Status: DC | PRN
  Administered 2021-07-03: 9000 mL
  Administered 2021-07-03: 6000 mL
  Administered 2021-07-03: 9000 mL

## 2021-07-03 MED ORDER — CHLORHEXIDINE GLUCONATE 0.12 % MT SOLN
OROMUCOSAL | Status: AC
  Administered 2021-07-03: 15 mL via OROMUCOSAL
  Filled 2021-07-03: qty 15

## 2021-07-03 MED ORDER — DEXAMETHASONE SODIUM PHOSPHATE 10 MG/ML IJ SOLN
INTRAMUSCULAR | Status: DC | PRN
  Administered 2021-07-03: 10 mg via INTRAVENOUS

## 2021-07-03 MED ORDER — OXYCODONE HCL 5 MG PO TABS: 5.0000 mg | ORAL_TABLET | Freq: Once | ORAL | Status: AC | PRN

## 2021-07-03 MED ORDER — ACETAMINOPHEN 10 MG/ML IV SOLN: 1000.0000 mg | Freq: Once | INTRAVENOUS | Status: DC | PRN

## 2021-07-03 MED ORDER — PHENYLEPHRINE HCL (PRESSORS) 10 MG/ML IV SOLN
INTRAVENOUS | Status: DC | PRN
  Administered 2021-07-03 (×4): 100 ug via INTRAVENOUS
  Administered 2021-07-03: 200 ug via INTRAVENOUS

## 2021-07-03 MED ORDER — ONDANSETRON HCL 4 MG/2ML IJ SOLN
INTRAMUSCULAR | Status: DC | PRN
  Administered 2021-07-03: 4 mg via INTRAVENOUS

## 2021-07-03 MED ORDER — OXYBUTYNIN CHLORIDE ER 5 MG PO TB24
5.0000 mg | ORAL_TABLET | Freq: Three times a day (TID) | ORAL | Status: DC
  Filled 2021-07-03: qty 1

## 2021-07-03 MED ORDER — ONDANSETRON HCL 4 MG/2ML IJ SOLN: 4.0000 mg | Freq: Once | INTRAMUSCULAR | Status: DC | PRN

## 2021-07-03 MED ORDER — OXYCODONE HCL 5 MG PO TABS
ORAL_TABLET | ORAL | Status: AC
  Administered 2021-07-03: 5 mg via ORAL
  Filled 2021-07-03: qty 1

## 2021-07-03 MED ORDER — PHENYLEPHRINE HCL (PRESSORS) 10 MG/ML IV SOLN
INTRAVENOUS | Status: AC
  Filled 2021-07-03: qty 1

## 2021-07-03 MED ORDER — PROPOFOL 10 MG/ML IV BOLUS
INTRAVENOUS | Status: AC
  Filled 2021-07-03: qty 20

## 2021-07-03 MED ORDER — FENTANYL CITRATE (PF) 100 MCG/2ML IJ SOLN
INTRAMUSCULAR | Status: AC
  Administered 2021-07-03: 25 ug via INTRAVENOUS
  Filled 2021-07-03: qty 2

## 2021-07-03 MED ORDER — LIDOCAINE HCL (PF) 2 % IJ SOLN
INTRAMUSCULAR | Status: AC
  Filled 2021-07-03: qty 5

## 2021-07-03 MED ORDER — 0.9 % SODIUM CHLORIDE (POUR BTL) OPTIME
TOPICAL | Status: DC | PRN
  Administered 2021-07-03: 50 mL

## 2021-07-03 MED ORDER — HYDROCODONE-ACETAMINOPHEN 5-325 MG PO TABS: 1.0000 | ORAL_TABLET | Freq: Four times a day (QID) | ORAL | 0 refills | Status: DC | PRN

## 2021-07-03 MED ORDER — PHENYLEPHRINE HCL-NACL 20-0.9 MG/250ML-% IV SOLN
INTRAVENOUS | Status: DC | PRN
  Administered 2021-07-03: 30 ug/min via INTRAVENOUS

## 2021-07-03 MED ORDER — IOHEXOL 180 MG/ML  SOLN
INTRAMUSCULAR | Status: DC | PRN
  Administered 2021-07-03: 15 mL

## 2021-07-03 MED ORDER — CHLORHEXIDINE GLUCONATE 0.12 % MT SOLN: 15.0000 mL | Freq: Once | OROMUCOSAL | Status: AC

## 2021-07-03 MED ORDER — ONDANSETRON HCL 4 MG/2ML IJ SOLN
INTRAMUSCULAR | Status: AC
  Filled 2021-07-03: qty 2

## 2021-07-03 MED ORDER — LIDOCAINE HCL (CARDIAC) PF 100 MG/5ML IV SOSY
PREFILLED_SYRINGE | INTRAVENOUS | Status: DC | PRN
  Administered 2021-07-03: 100 mg via INTRAVENOUS

## 2021-07-03 MED ORDER — FENTANYL CITRATE (PF) 100 MCG/2ML IJ SOLN
25.0000 ug | INTRAMUSCULAR | Status: DC | PRN
  Administered 2021-07-03 (×2): 25 ug via INTRAVENOUS

## 2021-07-03 MED ORDER — OXYBUTYNIN CHLORIDE 5 MG PO TABS
ORAL_TABLET | ORAL | Status: AC
  Administered 2021-07-03: 5 mg
  Filled 2021-07-03: qty 1

## 2021-07-03 MED ORDER — FUROSEMIDE 10 MG/ML IJ SOLN
INTRAMUSCULAR | Status: AC
  Filled 2021-07-03: qty 4

## 2021-07-03 MED ORDER — FENTANYL CITRATE (PF) 100 MCG/2ML IJ SOLN
INTRAMUSCULAR | Status: AC
  Filled 2021-07-03: qty 2

## 2021-07-03 MED ORDER — SEVOFLURANE IN SOLN
RESPIRATORY_TRACT | Status: AC
  Filled 2021-07-03: qty 250

## 2021-07-03 MED ORDER — PROPOFOL 10 MG/ML IV BOLUS
INTRAVENOUS | Status: DC | PRN
  Administered 2021-07-03: 30 mg via INTRAVENOUS
  Administered 2021-07-03: 130 mg via INTRAVENOUS

## 2021-07-03 MED ORDER — CEFAZOLIN SODIUM-DEXTROSE 2-4 GM/100ML-% IV SOLN
INTRAVENOUS | Status: AC
  Filled 2021-07-03: qty 100

## 2021-07-03 MED ORDER — LACTATED RINGERS IV SOLN: INTRAVENOUS | Status: DC

## 2021-07-03 SURGICAL SUPPLY — 38 items
ADAPTER IRRIG TUBE 2 SPIKE SOL (ADAPTER) ×6 IMPLANT
BAG DRAIN CYSTO-URO LG1000N (MISCELLANEOUS) ×3 IMPLANT
BAG URINE DRAIN 2000ML AR STRL (UROLOGICAL SUPPLIES) ×3 IMPLANT
BAG URO DRAIN 4000ML (MISCELLANEOUS) IMPLANT
BASKET ZERO TIP 1.9FR (BASKET) IMPLANT
BRUSH SCRUB EZ 1% IODOPHOR (MISCELLANEOUS) ×3 IMPLANT
CATH FOL 2WAY LX 20X30 (CATHETERS) ×3 IMPLANT
CATH FOL 2WAY LX 22X30 (CATHETERS) IMPLANT
CATH FOLEY 3WAY 30CC 22FR (CATHETERS) IMPLANT
CATH URETL OPEN 5X70 (CATHETERS) ×3 IMPLANT
CONTAINER COLLECT MORCELLATR (MISCELLANEOUS) ×2 IMPLANT
DRAPE 3/4 80X56 (DRAPES) ×3 IMPLANT
DRAPE UTILITY 15X26 TOWEL STRL (DRAPES) ×3 IMPLANT
FIBER LASER FLEXIVA PULSE 550 (Laser) ×3 IMPLANT
FILTER OVERFLOW MORCELLATOR (FILTER) ×2 IMPLANT
GAUZE 4X4 16PLY ~~LOC~~+RFID DBL (SPONGE) ×6 IMPLANT
GLOVE SURG ENC MOIS LTX SZ6.5 (GLOVE) ×6 IMPLANT
GOWN STRL REUS W/ TWL LRG LVL3 (GOWN DISPOSABLE) ×4 IMPLANT
GOWN STRL REUS W/TWL LRG LVL3 (GOWN DISPOSABLE) ×2
GUIDEWIRE STR DUAL SENSOR (WIRE) ×3 IMPLANT
HOLDER FOLEY CATH W/STRAP (MISCELLANEOUS) ×3 IMPLANT
IV NS IRRIG 3000ML ARTHROMATIC (IV SOLUTION) ×27 IMPLANT
KIT TURNOVER CYSTO (KITS) ×3 IMPLANT
MANIFOLD NEPTUNE II (INSTRUMENTS) IMPLANT
MBRN O SEALING YLW 17 FOR INST (MISCELLANEOUS) ×3
MEMBRANE SLNG YLW 17 FOR INST (MISCELLANEOUS) ×2 IMPLANT
MORCELLATOR COLLECT CONTAINER (MISCELLANEOUS) ×3
MORCELLATOR OVERFLOW FILTER (FILTER) ×3
MORCELLATOR ROTATION 4.75 335 (MISCELLANEOUS) ×3 IMPLANT
PACK CYSTO AR (MISCELLANEOUS) ×3 IMPLANT
SET CYSTO W/LG BORE CLAMP LF (SET/KITS/TRAYS/PACK) ×3 IMPLANT
SET IRRIG Y TYPE TUR BLADDER L (SET/KITS/TRAYS/PACK) ×3 IMPLANT
SLEEVE PROTECTION STRL DISP (MISCELLANEOUS) ×6 IMPLANT
SURGILUBE 2OZ TUBE FLIPTOP (MISCELLANEOUS) ×3 IMPLANT
SYR TOOMEY IRRIG 70ML (MISCELLANEOUS) ×3
SYRINGE TOOMEY IRRIG 70ML (MISCELLANEOUS) ×2 IMPLANT
TUBE PUMP MORCELLATOR PIRANHA (TUBING) ×3 IMPLANT
WATER STERILE IRR 1000ML POUR (IV SOLUTION) ×3 IMPLANT

## 2021-07-03 NOTE — Anesthesia Preprocedure Evaluation (Signed)
Anesthesia Evaluation  Patient identified by MRN, date of birth, ID band Patient awake    Reviewed: Allergy & Precautions, NPO status , Patient's Chart, lab work & pertinent test results  History of Anesthesia Complications Negative for: history of anesthetic complications  Airway Mallampati: II  TM Distance: >3 FB Neck ROM: Full    Dental  (+) Edentulous Upper, Edentulous Lower   Pulmonary neg pulmonary ROS, neg sleep apnea, COPD,  COPD inhaler, Current SmokerPatient did not abstain from smoking.,    Pulmonary exam normal breath sounds clear to auscultation       Cardiovascular Exercise Tolerance: Good METShypertension, (-) CAD and (-) Past MI negative cardio ROS  (-) dysrhythmias  Rhythm:Regular Rate:Normal - Systolic murmurs    Neuro/Psych Chronic back pain, s/p multiple lumbar surgeries. Patient says he has a difficult time with lithotomy position; one time after a 30 minute lithotomy procedure he was in excruciating back pain for days after.  Neuromuscular disease negative psych ROS   GI/Hepatic neg GERD  ,(+)     (-) substance abuse  ,   Endo/Other  neg diabetesHypothyroidism   Renal/GU CRFRenal diseasenegative Renal ROS     Musculoskeletal  (+) Arthritis ,   Abdominal   Peds  Hematology   Anesthesia Other Findings Past Medical History: 03/26/2021: Acquired hypothyroidism 03/26/2021: 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: Chronic bilateral low back pain without sciatica     Comment:  S/p 4 lumbar spine surgeries 03/27/2021: CKD (chronic kidney disease) stage 4, GFR 15-29 ml/min  ([place]) 03/26/2021: Essential hypertension No date: Hyperlipidemia No date: Hypertension 03/26/2021: Mixed hyperlipidemia No date: Thyroid disease  Reproductive/Obstetrics                             Anesthesia Physical Anesthesia Plan  ASA: 3  Anesthesia Plan: General   Post-op  Pain Management:    Induction: Intravenous  PONV Risk Score and Plan: 3 and Ondansetron, Dexamethasone and Treatment may vary due to age or medical condition  Airway Management Planned: Oral ETT and Video Laryngoscope Planned  Additional Equipment: None  Intra-op Plan:   Post-operative Plan: Extubation in OR  Informed Consent: I have reviewed the patients History and Physical, chart, labs and discussed the procedure including the risks, benefits and alternatives for the proposed anesthesia with the patient or authorized representative who has indicated his/her understanding and acceptance.     Dental advisory given  Plan Discussed with: CRNA and Surgeon  Anesthesia Plan Comments: (Discussed risks of anesthesia with patient, including PONV, sore throat, lip/dental damage. Rare risks discussed as well, such as cardiorespiratory and neurological sequelae, and allergic reactions. Regarding his back, we will do an awake trial of lithotomy position in stirrups to see which position he can tolerate. Will discuss with urologist about how best to proceed with positioning. Patient understands.)        Anesthesia Quick Evaluation

## 2021-07-03 NOTE — Anesthesia Procedure Notes (Signed)
Procedure Name: Intubation Date/Time: 07/03/2021 7:45 AM Performed by: Arita Miss, MD Pre-anesthesia Checklist: Patient identified, Emergency Drugs available, Suction available and Patient being monitored Patient Re-evaluated:Patient Re-evaluated prior to induction Oxygen Delivery Method: Circle system utilized Preoxygenation: Pre-oxygenation with 100% oxygen Induction Type: IV induction Ventilation: Mask ventilation without difficulty Grade View: Grade I Tube type: Oral Tube size: 7.5 mm Number of attempts: 2 (first attempt grade 1 view but view resembled esophagus, second attempt by MD) Airway Equipment and Method: Stylet and Oral airway Placement Confirmation: ETT inserted through vocal cords under direct vision, positive ETCO2 and breath sounds checked- equal and bilateral Secured at: 23 cm Tube secured with: Tape Dental Injury: Teeth and Oropharynx as per pre-operative assessment

## 2021-07-03 NOTE — Discharge Instructions (Addendum)
Holmium Laser Enucleation of the Prostate (HoLEP)  HoLEP is a treatment for men with benign prostatic hyperplasia (BPH). The laser surgery removed blockages of urine flow, and is done without any incisions on the body.     What is HoLEP?  HoLEP is a type of laser surgery used to treat obstruction (blockage) of urine flow as a result of benign prostatic hyperplasia (BPH). In men with BPH, the prostate gland is not cancerous, but has become enlarged. An enlarged prostate can result in a number of urinary tract symptoms such as weak urinary stream, difficulty in starting urination, inability to urinate, frequent urination, or getting up at night to urinate.  HoLEP was developed in the 1990's as a more effective and less expensive surgical option for BPH, compared to other surgical options such as laser vaporization(PVP/greenlight laser), transurethral resection of the prostate(TURP), and open simple prostatectomy.   What happens during a HoLEP?  HoLEP requires general anesthesia ("asleep" throughout the procedure).   An antibiotic is given to reduce the risk of infection  A surgical instrument called a resectoscope is inserted through the urethra (the tube that carries urine from the bladder). The resectoscope has a camera that allows the surgeon to view the internal structure of the prostate gland, and to see where the incisions are being made during surgery.  The laser is inserted into the resectoscope and is used to enucleate (free up) the enlarged prostate tissue from the capsule (outer shell) and then to seal up any blood vessels. The tissue that has been removed is pushed back into the bladder.  A morcellator is placed through the resectoscope, and is used to suction out the prostate tissue that has been pushed into the bladder.  When the prostate tissue has been removed, the resectoscope is removed, and a foley catheter is placed to allow healing and drain the urine from the  bladder.     What happens after a HoLEP?  More than 90% of patients go home the same day a few hours after surgery. Less than 10% will be admitted to the hospital overnight for observation to monitor the urine, or if they have other medical problems.  Fluid is flushed through the catheter for about 1 hour after surgery to clear any blood from the urine. It is normal to have some blood in the urine after surgery. The need for blood transfusion is extremely rare.  Eating and drinking are permitted after the procedure once the patient has fully awakened from anesthesia.  The catheter is usually removed 2-3 days after surgery- the patient will come to clinic to have the catheter removed and make sure they can urinate on their own.  It is very important to drink lots of fluids after surgery for one week to keep the bladder flushed.  At first, there may be some burning with urination, but this typically improved within a few hours to days. Most patients do not have a significant amount of pain, and narcotic pain medications are rarely needed.  Symptoms of urinary frequency, urgency, and even leakage are NORMAL for the first few weeks after surgery as the bladder adjusts after having to work hard against blockage from the prostate for many years. This will improve, but can sometimes take several months.  The use of pelvic floor exercises (Kegel exercises) can help improve problems with urinary incontinence.   After catheter removal, patients will be seen at 6 weeks and 6 months for symptom check  No heavy lifting for   at least 2-3 weeks after surgery, however patients can walk and do light activities the first day after surgery. Return to work time depends on occupation.    What are the advantages of HoLEP?  HoLEP has been studied in many different parts of the world and has been shown to be a safe and effective procedure. Although there are many types of BPH surgeries available, HoLEP offers a  unique advantage in being able to remove a large amount of tissue without any incisions on the body, even in very large prostates, while decreasing the risk of bleeding and providing tissue for pathology (to look for cancer). This decreases the need for blood transfusions during surgery, minimizes hospital stay, and reduces the risk of needing repeat treatment.  What are the side effects of HoLEP?  Temporary burning and bleeding during urination. Some blood may be seen in the urine for weeks after surgery and is part of the healing process.  Urinary incontinence (inability to control urine flow) is expected in all patients immediately after surgery and they should wear pads for the first few days/weeks. This typically improves over the course of several weeks. Performing Kegel exercises can help decrease leakage from stress maneuvers such as coughing, sneezing, or lifting. The rate of long term leakage is very low. Patients may also have leakage with urgency and this may be treated with medication. The risk of urge incontinence can be dependent on several factors including age, prostate size, symptoms, and other medical problems.  Retrograde ejaculation or "backwards ejaculation." In 75% of cases, the patient will not see any fluid during ejaculation after surgery.  Erectile function is generally not significantly affected.   What are the risks of HoLEP?  Injury to the urethra or development of scar tissue at a later date  Injury to the capsule of the prostate (typically treated with longer catheterization).  Injury to the bladder or ureteral orifices (where the urine from the kidney drains out)  Infection of the bladder, testes, or kidneys  Return of urinary obstruction at a later date requiring another operation (<2%)  Need for blood transfusion or re-operation due to bleeding  Failure to relieve all symptoms and/or need for prolonged catheterization after surgery  5-15% of patients are  found to have previously undiagnosed prostate cancer in their specimen. Prostate cancer can be treated after HoLEP.  Standard risks of anesthesia including blood clots, heart attacks, etc  When should I call my doctor?  Fever over 101.3 degrees  Inability to urinate, or large blood clots in the urine  AMBULATORY SURGERY  DISCHARGE INSTRUCTIONS   The drugs that you were given will stay in your system until tomorrow so for the next 24 hours you should not:  Drive an automobile Make any legal decisions Drink any alcoholic beverage   You may resume regular meals tomorrow.  Today it is better to start with liquids and gradually work up to solid foods.  You may eat anything you prefer, but it is better to start with liquids, then soup and crackers, and gradually work up to solid foods.   Please notify your doctor immediately if you have any unusual bleeding, trouble breathing, redness and pain at the surgery site, drainage, fever, or pain not relieved by medication.    Additional Instructions:        Please contact your physician with any problems or Same Day Surgery at [phone], Monday through Friday 6 am to 4 pm, or [place] at [facility] Main number   at [phone].  

## 2021-07-03 NOTE — Transfer of Care (Signed)
Immediate Anesthesia Transfer of Care Note  Patient: Tyrone Moore  Procedure(s) Performed: HOLEP-LASER ENUCLEATION OF THE PROSTATE WITH MORCELLATION CYSTOSCOPY WITH LITHOLAPAXY CYSTOSCOPY WITH RETROGRADE PYELOGRAM (Bilateral)  Patient Location: PACU  Anesthesia Type:General  Level of Consciousness: awake and drowsy  Airway & Oxygen Therapy: Patient Spontanous Breathing and Patient connected to face mask oxygen  Post-op Assessment: Report given to RN and Post -op Vital signs reviewed and stable  Post vital signs: stable  Last Vitals:  Vitals Value Taken Time  BP    Temp    Pulse    Resp    SpO2      Last Pain:  Vitals:   07/03/21 0644  TempSrc: Oral  PainSc: 0-No pain         Complications: No notable events documented.

## 2021-07-03 NOTE — Interval H&P Note (Signed)
H&P up-to-date no changes  Regular rate and rhythm Clear to auscultation bilaterally  All additional questions answered.

## 2021-07-03 NOTE — Anesthesia Postprocedure Evaluation (Signed)
Anesthesia Post Note  Patient: PTOLEMY SAVANNAH  Procedure(s) Performed: HOLEP-LASER ENUCLEATION OF THE PROSTATE WITH MORCELLATION CYSTOSCOPY WITH LITHOLAPAXY CYSTOSCOPY WITH RETROGRADE PYELOGRAM (Bilateral)  Patient location during evaluation: PACU Anesthesia Type: General Level of consciousness: awake and alert Pain management: pain level controlled Vital Signs Assessment: post-procedure vital signs reviewed and stable Respiratory status: spontaneous breathing, nonlabored ventilation, respiratory function stable and patient connected to nasal cannula oxygen Cardiovascular status: blood pressure returned to baseline and stable Postop Assessment: no apparent nausea or vomiting Anesthetic complications: no   No notable events documented.   Last Vitals:  Vitals:   07/03/21 1054 07/03/21 1112  BP: (!) 150/80 129/83  Pulse: 74 79  Resp: 16 18  Temp: (!) 36.2 C   SpO2: 95% 96%    Last Pain:  Vitals:   07/03/21 1054  TempSrc: Temporal  PainSc: 4                  Arita Miss

## 2021-07-03 NOTE — Op Note (Signed)
Date of procedure: 07/03/21  Preoperative diagnosis:  BPH with BOO Gross hematuria  Postoperative diagnosis:  same   Procedure: HoLEP with morcellation Bilateral retrograde pyelogram  Surgeon: Hollice Espy, MD  Anesthesia: General  Complications: None  Intraoperative findings: Unremarkable upper tract imaging other than J hooking of the distal ureter consistent with known BPH.  Trabeculated bladder with evidence of chronic outlet obstruction, elevated bladder neck without a discrete circumscribed median lobe.  EBL: Minimal  Specimens: Prostate chips  Drains: 61 French two-way Foley catheter with 60 cc in the balloon  Indication: Tyrone Moore is a 70 y.o. patient with chronic outlet obstruction secondary to BPH as well as gross hematuria.  After reviewing the management options for treatment, he elected to proceed with the above surgical procedure(s). We have discussed the potential benefits and risks of the procedure, side effects of the proposed treatment, the likelihood of the patient achieving the goals of the procedure, and any potential problems that might occur during the procedure or recuperation. Informed consent has been obtained.  Description of procedure:  The patient was taken to the operating room and general anesthesia was induced.  The patient was placed in the dorsal lithotomy position, prepped and draped in the usual sterile fashion, and preoperative antibiotics were administered. A preoperative time-out was performed.     A 70 French resectoscope sheath using a blunt angled obturator was introduced without difficulty into the bladder.  The bladder was carefully inspected and noted to be moderately trabeculated.  There is an elevated bladder neck with a very small intravesical component without a discrete median lobe.  Using an open-ended ureteral catheter, 5 French, each of the UOs were intubated and contrast was injected.  X-rays were taken to perform retrograde  pyelograms which revealed decompressed ureters with some distal J hooking of each of the ureters as well as decompressed upper tract collecting systems without filling defect or hydronephrosis.  The trigone was able to be visualized with some manipulation and the UOs were a good distance from the bladder neck itself.  The prostatic fossa had significant trilobar coaptation with greater than 5 cm prostatic length.  A 550 m laser fiber was then brought in and using settings of 2 J's and 50 Hz, a single incision in the 6 o'clock position was created extending from the bladder neck on either side down to the level of the bladder neck/capsular fibers.  The incision was carried down caudally meeting in the midline just above the verumontanum.  T   Next, a semilunar incision was created at the prostatic apex on the left side again freeing up the adenoma from the underlying capsule.  Care was taken to avoid any resection past the verumontanum.  This incision was carried around laterally and cranially towards the bladder neck.  Ultimately, I was able to complete the anterior commissure mucosa and the adenoma into the bladder creating a widely patent prostatic fossa.  I worked from a caudal to cranial direction creating the adenoma towards the bladder neck.  Ultimately, is able to cleave this and freed into the bladder itself.   Next, the same similar incision was created at the right prostatic apex.   Once this was completed and cleared from the bladder neck, the prostatic fossa was noted to be widely patent.  Hemostasis was achieved using hemostatic fiber settings.  Bilateral UOs were visualized and free of any injury.  Finally, the 5 French resectoscope was exchanged for nephroscope and using the Pir[place]ha handpiece  morcellator, the bladder was distended in each of the prostate chips were evacuated.  The bladder was irrigated several times and smaller chips were were cleared from the bladder.  This point time, there  were no residual fibers appreciated in the bladder.  Hemostasis was adequate.  10 mg of IV Lasix was administered to help with postoperative diuresis.  A 20 French two-way Foley catheter was then inserted over a catheter guide with 60 cc in the balloon.  The catheter irrigated easily and well.  Patient was then clean and dry, repositioned supine position, reversed from anesthesia, taken to PACU in stable condition.   Plan: Patient will return to the office in 2 days for voiding trial.      Hollice Espy, M.D.

## 2021-07-05 ENCOUNTER — Ambulatory Visit: Payer: Medicare Other

## 2021-07-05 ENCOUNTER — Other Ambulatory Visit: Payer: Self-pay

## 2021-07-05 DIAGNOSIS — N400 Enlarged prostate without lower urinary tract symptoms: Secondary | ICD-10-CM

## 2021-07-05 LAB — SURGICAL PATHOLOGY

## 2021-07-05 NOTE — Progress Notes (Signed)
Catheter Removal  Patient is present today for a catheter removal.  12m of water was drained from the balloon. A 20FR foley cath was removed from the bladder no complications were noted . Patient tolerated well.  Performed by: CBradly Bienenstock CMA

## 2021-07-06 ENCOUNTER — Telehealth: Payer: Self-pay | Admitting: *Deleted

## 2021-07-06 NOTE — Telephone Encounter (Addendum)
Patient informed, voiced understanding.   ----- Message from Hollice Espy, MD sent at 07/05/2021  6:22 PM EDT ----- Pathology is benign, no cancer in prostate specimen.  Great news.  Hollice Espy, MD

## 2021-07-08 ENCOUNTER — Emergency Department
Admission: EM | Admit: 2021-07-08 | Discharge: 2021-07-08 | Disposition: A | Discharge status: Home / Self Care | Payer: Medicare Other | Attending: Emergency Medicine | Admitting: Emergency Medicine

## 2021-07-08 ENCOUNTER — Other Ambulatory Visit: Payer: Self-pay

## 2021-07-08 DIAGNOSIS — E039 Hypothyroidism, unspecified: Secondary | ICD-10-CM | POA: Insufficient documentation

## 2021-07-08 DIAGNOSIS — N401 Enlarged prostate with lower urinary tract symptoms: Secondary | ICD-10-CM | POA: Insufficient documentation

## 2021-07-08 DIAGNOSIS — Z79899 Other long term (current) drug therapy: Secondary | ICD-10-CM | POA: Diagnosis not present

## 2021-07-08 DIAGNOSIS — F17210 Nicotine dependence, cigarettes, uncomplicated: Secondary | ICD-10-CM | POA: Diagnosis not present

## 2021-07-08 DIAGNOSIS — I129 Hypertensive chronic kidney disease with stage 1 through stage 4 chronic kidney disease, or unspecified chronic kidney disease: Secondary | ICD-10-CM | POA: Insufficient documentation

## 2021-07-08 DIAGNOSIS — N184 Chronic kidney disease, stage 4 (severe): Secondary | ICD-10-CM | POA: Diagnosis not present

## 2021-07-08 DIAGNOSIS — R339 Retention of urine, unspecified: Secondary | ICD-10-CM

## 2021-07-08 DIAGNOSIS — R3914 Feeling of incomplete bladder emptying: Secondary | ICD-10-CM

## 2021-07-08 NOTE — ED Notes (Signed)
Bladder scan read >419

## 2021-07-08 NOTE — ED Provider Notes (Signed)
[facility] Emergency Department Provider Note ____________________________________________  Time seen: 1123  I have reviewed the triage vital signs and the nursing notes.  HISTORY  Chief Complaint  Urinary Retention   HPI Tyrone Moore is a 70 y.o. male with below medical history, presents to the ED for evaluation of urinary retention.  Patient reports a recent laser enucleation of the prostate with Dr. Hollice Espy, and was discharged from that procedure about 5 days prior.  Patient presented to the office for subsequent catheter removal on the 25th.  He presents today, after he noted he has not been able to void for the last 16 to 18 hours.  He denies any fever, chills, chest pain, or shortness of breath.  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   Chronic bilateral low back pain without sciatica 03/26/2021   S/p 4 lumbar spine surgeries   CKD (chronic kidney disease) stage 4, GFR 15-29 ml/min ([place]) 03/27/2021   Essential hypertension 03/26/2021   Hyperlipidemia    Hypertension    Mixed hyperlipidemia 03/26/2021   Thyroid disease     Patient Active Problem List   Diagnosis Date Noted   Proteinuria, unspecified 05/16/2021   CKD (chronic kidney disease) stage 4, GFR 15-29 ml/min ([place]) 03/27/2021   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   Mixed hyperlipidemia 03/26/2021   Acquired hypothyroidism 03/26/2021   Essential hypertension 03/26/2021   Tobacco use disorder 03/26/2021   Chronic bilateral low back pain without sciatica 03/26/2021    Past Surgical History:  Procedure Laterality Date   CYSTOSCOPY W/ RETROGRADES Bilateral 07/03/2021   Procedure: CYSTOSCOPY WITH RETROGRADE PYELOGRAM;  Surgeon: Hollice Espy, MD;  Location: [facility] ORS;  Service: Urology;  Laterality: Bilateral;   CYSTOSCOPY WITH LITHOLAPAXY N/A 07/03/2021   Procedure:  CYSTOSCOPY WITH LITHOLAPAXY;  Surgeon: Hollice Espy, MD;  Location: [facility] ORS;  Service: Urology;  Laterality: N/A;   HOLEP-LASER ENUCLEATION OF THE PROSTATE WITH MORCELLATION N/A 07/03/2021   Procedure: HOLEP-LASER ENUCLEATION OF THE PROSTATE WITH MORCELLATION;  Surgeon: Hollice Espy, MD;  Location: [facility] ORS;  Service: Urology;  Laterality: N/A;   SPINE SURGERY  2015   X 4 surgery - lumbar disc    Prior to Admission medications   Medication Sig Start Date End Date Taking? Authorizing Provider  acetaminophen (TYLENOL) 500 MG tablet Take 1,000 mg by mouth every 6 (six) hours as needed for moderate pain.    [provider]  atorvastatin (LIPITOR) 40 MG tablet Take 40 mg by mouth daily.    [provider]  carvedilol (COREG) 25 MG tablet Take 25 mg by mouth 2 (two) times daily with a meal.    [provider]  Cholecalciferol (D3-1000) 25 MCG (1000 UT) capsule Take 2,000 Units by mouth daily.    [provider]  HYDROcodone-acetaminophen (NORCO/VICODIN) 5-325 MG tablet Take 1-2 tablets by mouth every 6 (six) hours as needed for moderate pain. 07/03/21   Hollice Espy, MD  levothyroxine (EUTHYROX) 112 MCG tablet Take 1 tablet (112 mcg total) by mouth daily before breakfast. 03/28/21   Glean Hess, MD  lisinopril (ZESTRIL) 10 MG tablet Take 10 mg by mouth daily.    [provider]  MAGNESIUM PO Take 1,000 mg by mouth daily.    [provider]  Multiple Vitamins-Minerals (MULTIVITAL-M PO) Take 1 tablet by mouth daily.    [provider]  omeprazole (PRILOSEC) 20 MG  capsule Take 20 mg by mouth daily.    [provider]  oxybutynin (DITROPAN) 5 MG tablet Take 1 tablet (5 mg total) by mouth every 8 (eight) hours as needed for bladder spasms. 07/03/21   Hollice Espy, MD  tamsulosin (FLOMAX) 0.4 MG CAPS capsule Take 0.8 mg by mouth daily after supper.    [provider]    Allergies Patient has no known  allergies.  Family History  Problem Relation Age of Onset   Hypertension Mother    Heart disease Mother    Heart disease Sister    Hypertension Sister    Prostate cancer Neg Hx    Bladder Cancer Neg Hx    Kidney cancer Neg Hx     Social History Social History   Tobacco Use   Smoking status: Every Day    Packs/day: 1.50    Years: 54.00    Pack years: 81.00    Types: Cigarettes   Smokeless tobacco: Never  Vaping Use   Vaping Use: Never used  Substance Use Topics   Alcohol use: Not Currently    Comment: quit drinking at 19   Drug use: Never    Review of Systems  Constitutional: Negative for fever. Cardiovascular: Negative for chest pain. Respiratory: Negative for shortness of breath. Gastrointestinal: Negative for abdominal pain, vomiting and diarrhea. Genitourinary: Negative for dysuria.  Reports urinary retention Musculoskeletal: Negative for back pain. Skin: Negative for rash. Neurological: Negative for headaches, focal weakness or numbness. ____________________________________________  PHYSICAL EXAM:  VITAL SIGNS: ED Triage Vitals  Enc Vitals Group     BP 07/08/21 1002 126/69     Pulse Rate 07/08/21 1002 85     Resp 07/08/21 1002 18     Temp 07/08/21 1002 98.5 F (36.9 C)     Temp Source 07/08/21 1002 Oral     SpO2 07/08/21 1002 98 %     Weight 07/08/21 1003 208 lb (94.3 kg)     Height 07/08/21 1003 '5\' 11"'$  (1.803 m)     Head Circumference --      Peak Flow --      Pain Score 07/08/21 1003 0     Pain Loc --      Pain Edu? --      Excl. in [place]? --     Constitutional: Alert and oriented. Well appearing and in no distress. Head: Normocephalic and atraumatic. Eyes: Conjunctivae are normal. Normal extraocular movements Cardiovascular: Normal rate, regular rhythm. Normal distal pulses. Respiratory: Normal respiratory effort. No wheezes/rales/rhonchi. Gastrointestinal: Soft and nontender. No distention.    Musculoskeletal: Nontender with normal range of  motion in all extremities.  Neurologic:  Normal gait without ataxia. Normal speech and language. No gross focal neurologic deficits are appreciated. Skin:  Skin is warm, dry and intact. No rash noted. Psychiatric: Mood and affect are normal. Patient exhibits appropriate insight and judgment. ____________________________________________    {LABS (pertinent positives/negatives)  ____________________________________________  {EKG  ____________________________________________   RADIOLOGY Official radiology report(s): No results found. ____________________________________________  PROCEDURES  Bladder scan  400+ cc Bladder scan s/p spontaneous void 126 cc  Procedures ____________________________________________   INITIAL IMPRESSION / ASSESSMENT AND PLAN / ED COURSE  As part of my medical decision making, I reviewed the following data within the [place] chart reviewed and Notes from prior ED visits   Patient ED evaluation of urinary retention, presents for concern over postprocedural retention.  Patient was however, able to spontaneously void in the ED just  prior to my evaluation.  Previous bladder scans revealed initially greater than 400 cc of urine in the bladder, and his postvoid residual was about 126 cc, performed by me.  Patient reports significant improvement of his symptoms at this time, and is declining a Foley catheter insertion.  He will follow-up with his urologist as planned return to ED if necessary.    ROBT GUNNELL was evaluated in Emergency Department on 07/08/2021 for the symptoms described in the history of present illness. He was evaluated in the context of the global COVID-19 pandemic, which necessitated consideration that the patient might be at risk for infection with the SARS-CoV-2 virus that causes COVID-19. Institutional protocols and algorithms that pertain to the evaluation of patients at risk for COVID-19 are in a state of rapid change  based on information released by regulatory bodies including the CDC and federal and state organizations. These policies and algorithms were followed during the patient's care in the ED. ____________________________________________  FINAL CLINICAL IMPRESSION(S) / ED DIAGNOSES  Final diagnoses:  Urinary retention  Benign prostatic hyperplasia with incomplete bladder emptying      Bethannie Iglehart, Dannielle Karvonen, PA-C 07/09/21 1549    Nance Pear, MD 07/09/21 [phone]

## 2021-07-08 NOTE — ED Triage Notes (Signed)
Pt called for triage, no response. 

## 2021-07-08 NOTE — ED Triage Notes (Signed)
Pt comes pov for being unable to urinate. Hasn't peed since yesterday afternoon.

## 2021-07-08 NOTE — ED Notes (Signed)
Bladder scan 126

## 2021-07-08 NOTE — Discharge Instructions (Addendum)
You were evaluated in the ED, and were able to pass urine spontaneously without a Foley catheter being placed.  You should follow-up with your urologist for continued symptoms.  If you experience urinary retention, please report to the ED immediately for further management of his symptoms.

## 2021-07-08 NOTE — ED Notes (Signed)
Pt NAD, a/ox4. Pt verbalizes understanding of all DC and f/u instructions. All questions answered. Pt walks with steady gait to lobby at DC with family member at side.

## 2021-07-08 NOTE — ED Notes (Signed)
Post void bladder scan 362m

## 2021-07-10 ENCOUNTER — Other Ambulatory Visit: Payer: Self-pay

## 2021-07-10 ENCOUNTER — Encounter: Payer: Self-pay | Admitting: Physician Assistant

## 2021-07-10 ENCOUNTER — Ambulatory Visit: Payer: Medicare Other | Admitting: Physician Assistant

## 2021-07-10 VITALS — BP 173/76 | HR 89 | Ht 71.0 in | Wt 190.0 lb

## 2021-07-10 DIAGNOSIS — R339 Retention of urine, unspecified: Secondary | ICD-10-CM

## 2021-07-10 DIAGNOSIS — R300 Dysuria: Secondary | ICD-10-CM | POA: Diagnosis not present

## 2021-07-10 LAB — BLADDER SCAN AMB NON-IMAGING

## 2021-07-10 NOTE — Patient Instructions (Signed)
Congratulations on your recent HOLEP procedure! As discussed in clinic today, there are three main side effects that commonly occur after surgery: Burning or pain with urination: This typically resolves within 1 week of surgery. If you are still having significant pain with urination 10 days after surgery, please call our clinic. We may need to check you for a urinary tract infection at that point, though this is rare. Blood in the urine: This may come and go, but typically resolves completely within 3 weeks of surgery. If you are on blood thinners, it may take longer for the bleeding to resolve. As long as your urine remains thin and runny and you are not passing large clots (around the size of your palm), this is a normal postoperative finding. If you start to pass dark red urine or thick, ketchup-like urine, please call our office immediately. Urinary leakage or urgency: This tends to improve with time, with most patients becoming dry within around 3 months of surgery. You may wear absorbant underwear or liners for security during this time. To help you get dry faster, please make sure you are completing your Kegel exercises as instructed, with a set of 10 exercises completed up to three times daily.  

## 2021-07-10 NOTE — Progress Notes (Signed)
07/10/2021 1:49 PM   Tyrone Moore 1950-11-16 [id number]  CC: Chief Complaint  Patient presents with   Follow-up   Urinary Retention   HPI: Tyrone Moore is a 70 y.o. male with a history of BPH with chronic bladder outlet obstruction and gross hematuria who underwent HOLEP with Dr. Erlene Quan 7 days ago.  Foley catheter was removed in clinic on POD 2.  Surgical pathology was benign.  He presented to the ED on POD 5 with reports of 18 hours of the inability to void.  He subsequently was able to void in the emergency department with PVR 126 mL.  He was scheduled for outpatient follow-up in our clinic with concerns for possible urinary retention as well as UTI, though UA was not obtained in the ED.  Today he reports no further difficulty voiding since he presented to the emergency department.  He does not think he was constipated at the time and states he is emptying his bowels well today.  He reports some mild "bee sting" twinges at the tip of his penis associated with the urge to void and has had some very mild intermittent urinary leakage since surgery.  He states his gross hematuria has resolved.  In-office UA today positive for 3+ blood, 3+ protein, and 1+ leukocyte esterase; urine microscopy with >30 WBCs/HPF and >30 RBCs/HPF. PVR 46m.  PMH: 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   Chronic bilateral low back pain without sciatica 03/26/2021   S/p 4 lumbar spine surgeries   CKD (chronic kidney disease) stage 4, GFR 15-29 ml/min ([place] 03/27/2021   Essential hypertension 03/26/2021   Hyperlipidemia    Hypertension    Mixed hyperlipidemia 03/26/2021   Thyroid disease     Surgical History: Past Surgical History:  Procedure Laterality Date   CYSTOSCOPY W/ RETROGRADES Bilateral 07/03/2021   Procedure: CYSTOSCOPY WITH RETROGRADE PYELOGRAM;  Surgeon: BHollice Espy MD;  Location: [facility] ORS;   Service: Urology;  Laterality: Bilateral;   CYSTOSCOPY WITH LITHOLAPAXY N/A 07/03/2021   Procedure: CYSTOSCOPY WITH LITHOLAPAXY;  Surgeon: BHollice Espy MD;  Location: [facility] ORS;  Service: Urology;  Laterality: N/A;   HOLEP-LASER ENUCLEATION OF THE PROSTATE WITH MORCELLATION N/A 07/03/2021   Procedure: HOLEP-LASER ENUCLEATION OF THE PROSTATE WITH MORCELLATION;  Surgeon: BHollice Espy MD;  Location: [facility] ORS;  Service: Urology;  Laterality: N/A;   SPINE SURGERY  2015   X 4 surgery - lumbar disc    Home Medications:  Allergies as of 07/10/2021   No Known Allergies      Medication List        Accurate as of July 10, 2021  1:49 PM. If you have any questions, ask your nurse or doctor.          acetaminophen 500 MG tablet Commonly known as: TYLENOL Take 1,000 mg by mouth every 6 (six) hours as needed for moderate pain.   atorvastatin 40 MG tablet Commonly known as: LIPITOR Take 40 mg by mouth daily.   carvedilol 25 MG tablet Commonly known as: COREG Take 25 mg by mouth 2 (two) times daily with a meal.   D3-1000 25 MCG (1000 UT) capsule Generic drug: Cholecalciferol Take 2,000 Units by mouth daily.   HYDROcodone-acetaminophen 5-325 MG tablet Commonly known as: NORCO/VICODIN Take 1-2 tablets by mouth every 6 (six) hours as needed for moderate pain.   levothyroxine 112 MCG tablet Commonly known as: Euthyrox Take 1 tablet (  112 mcg total) by mouth daily before breakfast.   lisinopril 10 MG tablet Commonly known as: ZESTRIL Take 10 mg by mouth daily.   MAGNESIUM PO Take 1,000 mg by mouth daily.   MULTIVITAL-M PO Take 1 tablet by mouth daily.   omeprazole 20 MG capsule Commonly known as: PRILOSEC Take 20 mg by mouth daily.   oxybutynin 5 MG tablet Commonly known as: DITROPAN Take 1 tablet (5 mg total) by mouth every 8 (eight) hours as needed for bladder spasms.   tamsulosin 0.4 MG Caps capsule Commonly known as: FLOMAX Take 0.8 mg by mouth daily after  supper.        Allergies:  No Known Allergies  Family History: Family History  Problem Relation Age of Onset   Hypertension Mother    Heart disease Mother    Heart disease Sister    Hypertension Sister    Prostate cancer Neg Hx    Bladder Cancer Neg Hx    Kidney cancer Neg Hx     Social History:   reports that he has been smoking cigarettes. He has a 81.00 pack-year smoking history. He has never used smokeless tobacco. He reports that he does not currently use alcohol. He reports that he does not use drugs.  Physical Exam: There were no vitals taken for this visit.  Constitutional:  Alert and oriented, no acute distress, nontoxic appearing HEENT: [place], AT Cardiovascular: No clubbing, cyanosis, or edema Respiratory: Normal respiratory effort, no increased work of breathing Skin: No rashes, bruises or suspicious lesions Neurologic: Grossly intact, no focal deficits, moving all 4 extremities Psychiatric: Normal mood and affect  Laboratory Data: Results for orders placed or performed in visit on 07/10/21  CULTURE, URINE COMPREHENSIVE   Specimen: Urine   UR  Result Value Ref Range   Urine Culture, Comprehensive Preliminary report    Organism ID, Bacteria Comment   Microscopic Examination   Urine  Result Value Ref Range   WBC, UA >30 (A) 0 - 5 /hpf   RBC >30 (A) 0 - 2 /hpf   Epithelial Cells (non renal) 0-10 0 - 10 /hpf   Casts Present (A) None seen /lpf   Cast Type Hyaline casts N/A   Bacteria, UA None seen None seen/Few  Urinalysis, Complete  Result Value Ref Range   Specific Gravity, UA 1.020 1.005 - 1.030   pH, UA 6.0 5.0 - 7.5   Color, UA Yellow Yellow   Appearance Ur Cloudy (A) Clear   Leukocytes,UA 1+ (A) Negative   Protein,UA 3+ (A) Negative/Trace   Glucose, UA Negative Negative   Ketones, UA Negative Negative   RBC, UA 3+ (A) Negative   Bilirubin, UA Negative Negative   Urobilinogen, Ur 0.2 0.2 - 1.0 mg/dL   Nitrite, UA Negative Negative   Microscopic  Examination See below:   Bladder Scan (Post Void Residual) in office  Result Value Ref Range   Scan Result 21m    Assessment & Plan:   1. Dysuria Seems to be associated with bladder spasms/urge incontinence versus true dysuria.  I reassured the patient that this is a normal postoperative finding.  UA today is notable for pyuria and microscopic hematuria consistent with his postop status.  We will send for culture for further evaluation and treat as indicated, though I suspect this will be negative.  We discussed normal postoperative findings including dysuria, gross hematuria, and urge incontinence. - Urinalysis, Complete - CULTURE, URINE COMPREHENSIVE  2. Urinary retention Resolved, emptying appropriately today. -  Bladder Scan (Post Void Residual) in office   Return in about 8 weeks (around 09/04/2021) for Postop follow-up with Dr. Erlene Quan.  Debroah Loop, PA-C  [facility] [address], [place], [place] [address] [phone]

## 2021-07-11 DIAGNOSIS — J449 Chronic obstructive pulmonary disease, unspecified: Secondary | ICD-10-CM | POA: Insufficient documentation

## 2021-07-11 DIAGNOSIS — N184 Chronic kidney disease, stage 4 (severe): Secondary | ICD-10-CM | POA: Diagnosis not present

## 2021-07-11 DIAGNOSIS — R809 Proteinuria, unspecified: Secondary | ICD-10-CM | POA: Diagnosis not present

## 2021-07-11 DIAGNOSIS — I10 Essential (primary) hypertension: Secondary | ICD-10-CM | POA: Diagnosis not present

## 2021-07-11 DIAGNOSIS — J441 Chronic obstructive pulmonary disease with (acute) exacerbation: Secondary | ICD-10-CM | POA: Insufficient documentation

## 2021-07-11 HISTORY — DX: Chronic obstructive pulmonary disease, unspecified: J44.9

## 2021-07-11 LAB — URINALYSIS, COMPLETE
Bilirubin, UA: NEGATIVE
Glucose, UA: NEGATIVE
Ketones, UA: NEGATIVE
Nitrite, UA: NEGATIVE
Specific Gravity, UA: 1.02 (ref 1.005–1.030)
Urobilinogen, Ur: 0.2 mg/dL (ref 0.2–1.0)
pH, UA: 6 (ref 5.0–7.5)

## 2021-07-11 LAB — MICROSCOPIC EXAMINATION
Bacteria, UA: NONE SEEN
RBC, Urine: >30 /hpf — AB (ref 0–2)
WBC, UA: >30 /hpf — AB (ref 0–5)

## 2021-07-13 ENCOUNTER — Telehealth: Payer: Self-pay

## 2021-07-13 LAB — CULTURE, URINE COMPREHENSIVE

## 2021-07-13 NOTE — Telephone Encounter (Signed)
-----   Message from Debroah Loop, [place] sent at 07/13/2021 11:35 AM EDT ----- Ucx negative, no abx needed. ----- Message ----- From: Lavone Neri Lab Results In Sent: 07/11/2021   5:37 AM EDT To: Debroah Loop, PA-C

## 2021-07-13 NOTE — Telephone Encounter (Signed)
Nnotified patient as advised, patient expressed understanding.

## 2021-07-18 ENCOUNTER — Ambulatory Visit (INDEPENDENT_AMBULATORY_CARE_PROVIDER_SITE_OTHER): Payer: Medicare Other

## 2021-07-18 DIAGNOSIS — Z0000 Encounter for general adult medical examination without abnormal findings: Secondary | ICD-10-CM

## 2021-07-18 NOTE — Progress Notes (Signed)
Subjective:   Tyrone Moore is a 70 y.o. male who presents for an Initial Medicare Annual Wellness Visit.  Virtual Visit via Telephone Note  I connected with  Tyrone Moore on 07/18/21 at  1:20 PM EDT by telephone and verified that I am speaking with the correct person using two identifiers.  Location: Patient: home Provider: [facility] Persons participating in the virtual visit: [place]   I discussed the limitations, risks, security and privacy concerns of performing an evaluation and management service by telephone and the availability of in person appointments. The patient expressed understanding and agreed to proceed.  Interactive audio and video telecommunications were attempted between this nurse and patient, however failed, due to patient having technical difficulties OR patient did not have access to video capability.  We continued and completed visit with audio only.  Some vital signs may be absent or patient reported.   Clemetine Marker, LPN   Review of Systems     Cardiac Risk Factors include: advanced age (>40mn, >>78women);hypertension;dyslipidemia;male gender;smoking/ tobacco exposure     Objective:    There were no vitals filed for this visit. There is no height or weight on file to calculate BMI.  Advanced Directives 07/18/2021 07/08/2021 06/21/2021  Does Patient Have a Medical Advance Directive? Yes No No  Type of AParamedicof ADe[place]ord[place] will - -  Copy of HH[place] Chart? No - copy requested - -  Would patient like information on creating a medical advance directive? - - No - Patient declined    Current Medications (verified) Outpatient Encounter Medications as of 07/18/2021  Medication Sig   carvedilol (COREG) 25 MG tablet Take 25 mg by mouth 2 (two) times daily with a meal.   Cholecalciferol (D3-1000) 25 MCG (1000 UT) capsule Take 2,000 Units by mouth daily.   levothyroxine (EUTHYROX) 112 MCG  tablet Take 1 tablet (112 mcg total) by mouth daily before breakfast.   lisinopril (ZESTRIL) 10 MG tablet Take 10 mg by mouth daily.   MAGNESIUM PO Take 1,000 mg by mouth daily.   Multiple Vitamins-Minerals (MULTIVITAL-M PO) Take 1 tablet by mouth daily.   omeprazole (PRILOSEC) 20 MG capsule Take 20 mg by mouth daily.   tamsulosin (FLOMAX) 0.4 MG CAPS capsule Take 0.8 mg by mouth daily after supper.   acetaminophen (TYLENOL) 500 MG tablet Take 1,000 mg by mouth every 6 (six) hours as needed for moderate pain.   atorvastatin (LIPITOR) 40 MG tablet Take 40 mg by mouth daily. (Patient not taking: Reported on 07/18/2021)   [DISCONTINUED] HYDROcodone-acetaminophen (NORCO/VICODIN) 5-325 MG tablet Take 1-2 tablets by mouth every 6 (six) hours as needed for moderate pain. (Patient not taking: Reported on 07/10/2021)   [DISCONTINUED] oxybutynin (DITROPAN) 5 MG tablet Take 1 tablet (5 mg total) by mouth every 8 (eight) hours as needed for bladder spasms. (Patient not taking: No sig reported)   No facility-administered encounter medications on file as of 07/18/2021.    Allergies (verified) Patient has no known allergies.   History: 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   Chronic bilateral low back pain without sciatica 03/26/2021   S/p 4 lumbar spine surgeries   Chronic obstructive pulmonary disease, unspecified ([place] 07/11/2021   CKD (chronic kidney disease) stage 4, GFR 15-29 ml/min ([facility] 03/27/2021   Essential hypertension 03/26/2021   GERD (gastroesophageal reflux disease)    Hyperlipidemia  Hypertension    Mixed hyperlipidemia 03/26/2021   Thyroid disease    Past Surgical History:  Procedure Laterality Date   CYSTOSCOPY W/ RETROGRADES Bilateral 07/03/2021   Procedure: CYSTOSCOPY WITH RETROGRADE PYELOGRAM;  Surgeon: Hollice Espy, MD;  Location: [facility];  Service: Urology;  Laterality:  Bilateral;   CYSTOSCOPY WITH LITHOLAPAXY N/A 07/03/2021   Procedure: CYSTOSCOPY WITH LITHOLAPAXY;  Surgeon: Hollice Espy, MD;  Location: [facility];  Service: Urology;  Laterality: N/A;   HOLEP-LASER ENUCLEATION OF THE PROSTATE WITH MORCELLATION N/A 07/03/2021   Procedure: HOLEP-LASER ENUCLEATION OF THE PROSTATE WITH MORCELLATION;  Surgeon: Hollice Espy, MD;  Location: [facility];  Service: Urology;  Laterality: N/A;   SPINE SURGERY  2015   X 4 surgery - lumbar disc   Family History  Problem Relation Age of Onset   Hypertension Mother    Heart disease Mother    Heart disease Sister    Hypertension Sister    Prostate cancer Neg Hx    Bladder Cancer Neg Hx    Kidney cancer Neg Hx    Social History   Socioeconomic History   Marital status: Widowed    Spouse name: Not on file   Number of children: Not on file   Years of education: Not on file   Highest education level: Not on file  Occupational History   Not on file  Tobacco Use   Smoking status: Heavy Smoker    Packs/day: 1.00    Years: 54.00    Pack years: 54.00    Types: Cigarettes   Smokeless tobacco: Never  Vaping Use   Vaping Use: Never used  Substance and Sexual Activity   Alcohol use: Not Currently    Comment: quit drinking at 19   Drug use: Never   Sexual activity: Not Currently    Birth control/protection: None  Other Topics Concern   Not on file  Social History Narrative   Pt lives with son and his family   Social Determinants of Health   Financial Resource Strain: Low Risk    Difficulty of Paying Living Expenses: Not hard at all  Food Insecurity: No Food Insecurity   Worried About Charity fundraiser in the Last Year: Never true   Arboriculturist in the Last Year: Never true  Transportation Needs: No Transportation Needs   Lack of Transportation (Medical): No   Lack of Transportation (Non-Medical): No  Physical Activity: Inactive   Days of Exercise per Week: 0 days   Minutes of Exercise per  Session: 0 min  Stress: No Stress Concern Present   Feeling of Stress : Not at all  Social Connections: Socially Isolated   Frequency of Communication with Friends and Family: More than three times a week   Frequency of Social Gatherings with Friends and Family: More than three times a week   Attends Religious Services: Never   Marine scientist or Organizations: No   Attends Archivist Meetings: Never   Marital Status: Widowed    Tobacco Counseling Ready to quit: No Counseling given: Not Answered   Clinical Intake:  Pre-visit preparation completed: Yes  Pain : No/denies pain     Nutritional Risks: None Diabetes: No  How often do you need to have someone help you when you read instructions, pamphlets, or other written materials from your doctor or pharmacy?: 1 - Never    Interpreter Needed?: No  Information entered by :: Clemetine Marker LPN   Activities of Daily Living  In your present state of health, do you have any difficulty performing the following activities: 07/18/2021 06/21/2021  Hearing? N N  Vision? N N  Difficulty concentrating or making decisions? N N  Walking or climbing stairs? Y Y  Dressing or bathing? N N  Doing errands, shopping? N N  Preparing Food and eating ? N -  Using the Toilet? N -  In the past six months, have you accidently leaked urine? Y -  Do you have problems with loss of bowel control? N -  Managing your Medications? N -  Managing your Finances? N -  Housekeeping or managing your Housekeeping? N -    Patient Care Team: Glean Hess, MD as PCP - General (Internal Medicine)  Indicate any recent Medical Services you may have received from other than [facility] providers in the past year (date may be approximate).     Assessment:   This is a routine wellness examination for Tyrone Moore.  Hearing/Vision screen Hearing Screening - Comments:: Pt denies hearing difficulty Vision Screening - Comments:: Past due for eye exam; plans  to establish care with [facility]   Dietary issues and exercise activities discussed: Current Exercise Habits: The patient does not participate in regular exercise at present, Exercise limited by: respiratory conditions(s)   Goals Addressed   None    Depression Screen PHQ 2/9 Scores 07/18/2021 03/26/2021  PHQ - 2 Score 0 1  PHQ- 9 Score - 9    Fall Risk Fall Risk  07/18/2021 03/26/2021  Falls in the past year? 0 0  Number falls in past yr: 0 0  Injury with Fall? 0 0  Risk for fall due to : No Fall Risks -  Follow up Falls prevention discussed Falls evaluation completed    [place]:  Any stairs in or around the home? No  If so, are there any without handrails? No  Home free of loose throw rugs in walkways, pet beds, electrical cords, etc? Yes  Adequate lighting in your home to reduce risk of falls? Yes   ASSISTIVE DEVICES UTILIZED TO PREVENT FALLS:  Life alert? No  Use of a cane, walker or w/c? No  Grab bars in the bathroom? No  Shower chair or bench in shower? Yes  Elevated toilet seat or a handicapped toilet? No   TIMED UP AND GO:  Was the test performed? No .  Telephonic visit.   Cognitive Function: Normal cognitive status assessed by direct observation by this Nurse Health Advisor. No abnormalities found.          Immunizations  There is no immunization history on file for this patient.  TDAP status: Due, Education has been provided regarding the importance of this vaccine. Advised may receive this vaccine at local pharmacy or Health Dept. Aware to provide a copy of the vaccination record if obtained from local pharmacy or Health Dept. Verbalized acceptance and understanding.  Flu Vaccine status: Declined, Education has been provided regarding the importance of this vaccine but patient still declined. Advised may receive this vaccine at local pharmacy or Health Dept. Aware to provide a copy of the vaccination record if obtained from  local pharmacy or Health Dept. Verbalized acceptance and understanding.  Pneumococcal vaccine status: Declined,  Education has been provided regarding the importance of this vaccine but patient still declined. Advised may receive this vaccine at local pharmacy or Health Dept. Aware to provide a copy of the vaccination record if obtained from local pharmacy  or Health Dept. Verbalized acceptance and understanding.   Covid-19 vaccine status: Declined, Education has been provided regarding the importance of this vaccine but patient still declined. Advised may receive this vaccine at local pharmacy or Health Dept.or vaccine clinic. Aware to provide a copy of the vaccination record if obtained from local pharmacy or Health Dept. Verbalized acceptance and understanding.  Qualifies for Shingles Vaccine? Yes   Zostavax completed No   Shingrix Completed?: No.    Education has been provided regarding the importance of this vaccine. Patient has been advised to call insurance company to determine out of pocket expense if they have not yet received this vaccine. Advised may also receive vaccine at local pharmacy or Health Dept. Verbalized acceptance and understanding.  Screening Tests Health Maintenance  Topic Date Due   COVID-19 Vaccine (1) Never done   Hepatitis C Screening  Never done   Zoster Vaccines- Shingrix (1 of 2) Never done   INFLUENZA VACCINE  Never done   COLONOSCOPY (Pts 45-53yr Insurance coverage will need to be confirmed)  03/26/2022 (Originally 09/11/1996)   TETANUS/TDAP  03/26/2022 (Originally 09/11/1970)   PNA vac Low Risk Adult (1 of 2 - PCV13) 03/26/2022 (Originally 09/11/2016)   HPV VACCINES  Aged Out    Health Maintenance  Health Maintenance Due  Topic Date Due   COVID-19 Vaccine (1) Never done   Hepatitis C Screening  Never done   Zoster Vaccines- Shingrix (1 of 2) Never done   INFLUENZA VACCINE  Never done    Colorectal cancer screening: pt declines referral to  gastroenterology for colonoscopy   Lung Cancer Screening: (Low Dose CT Chest recommended if Age 70-80years, 30 pack-year currently smoking OR have quit w/in 15years.) does qualify. Declines.   Additional Screening:  Hepatitis C Screening: does qualify; postponed  Vision Screening: Recommended annual ophthalmology exams for early detection of glaucoma and other disorders of the eye. Is the patient up to date with their annual eye exam?  No  Who is the provider or what is the name of the office in which the patient attends annual eye exams? Not established  If pt is not established with a provider, would they like to be referred to a provider to establish care? No .   Dental Screening: Recommended annual dental exams for proper oral hygiene  Community Resource Referral / Chronic Care Management: CRR required this visit?  No   CCM required this visit?  No      Plan:     I have personally reviewed and noted the following in the patient's chart:   Medical and social history Use of alcohol, tobacco or illicit drugs  Current medications and supplements including opioid prescriptions. Patient is not currently taking opioid prescriptions. Functional ability and status Nutritional status Physical activity Advanced directives List of other physicians Hospitalizations, surgeries, and ER visits in previous 12 months Vitals Screenings to include cognitive, depression, and falls Referrals and appointments  In addition, I have reviewed and discussed with patient certain preventive protocols, quality metrics, and best practice recommendations. A written personalized care plan for preventive services as well as general preventive health recommendations were provided to patient.     KClemetine Marker LPN   [id number]  Nurse Notes: pt advised to reschedule appt that was no showed 06/26/21 for HTN follow up.

## 2021-07-18 NOTE — Patient Instructions (Signed)
Tyrone Moore , Thank you for taking time to come for your Medicare Wellness Visit. I appreciate your ongoing commitment to your health goals. Please review the following plan we discussed and let me know if I can assist you in the future.   Screening recommendations/referrals: Colonoscopy: declined  Recommended yearly ophthalmology/optometry visit for glaucoma screening and checkup Recommended yearly dental visit for hygiene and checkup  Vaccinations: Influenza vaccine: declined Pneumococcal vaccine: declined Tdap vaccine: due Shingles vaccine: Shingrix discussed. Please contact your pharmacy for coverage information.  Covid-19: declined  Advanced directives: Please bring a copy of your health care power of attorney and living will to the office at your convenience.   Conditions/risks identified: If you wish to quit smoking, help is available. For free tobacco cessation program offerings call the [facility] at [phone] or Live Well Line at 2156705030. You may also visit [email or web address] or email [email or web address] for more information on other programs.   Next appointment: Follow up in one year for your annual wellness visit.   Preventive Care 15 Years and Older, Male Preventive care refers to lifestyle choices and visits with your health care provider that can promote health and wellness. What does preventive care include? A yearly physical exam. This is also called an annual well check. Dental exams once or twice a year. Routine eye exams. Ask your health care provider how often you should have your eyes checked. Personal lifestyle choices, including: Daily care of your teeth and gums. Regular physical activity. Eating a healthy diet. Avoiding tobacco and drug use. Limiting alcohol use. Practicing safe sex. Taking low doses of aspirin every day. Taking vitamin and mineral supplements as recommended by your health care provider. What happens  during an annual well check? The services and screenings done by your health care provider during your annual well check will depend on your age, overall health, lifestyle risk factors, and family history of disease. Counseling  Your health care provider may ask you questions about your: Alcohol use. Tobacco use. Drug use. Emotional well-being. Home and relationship well-being. Sexual activity. Eating habits. History of falls. Memory and ability to understand (cognition). Work and work Statistician. Screening  You may have the following tests or measurements: Height, weight, and BMI. Blood pressure. Lipid and cholesterol levels. These may be checked every 5 years, or more frequently if you are over 58 years old. Skin check. Lung cancer screening. You may have this screening every year starting at age 62 if you have a 30-pack-year history of smoking and currently smoke or have quit within the past 15 years. Fecal occult blood test (FOBT) of the stool. You may have this test every year starting at age 13. Flexible sigmoidoscopy or colonoscopy. You may have a sigmoidoscopy every 5 years or a colonoscopy every 10 years starting at age 27. Prostate cancer screening. Recommendations will vary depending on your family history and other risks. Hepatitis C blood test. Hepatitis B blood test. Sexually transmitted disease (STD) testing. Diabetes screening. This is done by checking your blood sugar (glucose) after you have not eaten for a while (fasting). You may have this done every 1-3 years. Abdominal aortic aneurysm (AAA) screening. You may need this if you are a current or former smoker. Osteoporosis. You may be screened starting at age 80 if you are at high risk. Talk with your health care provider about your test results, treatment options, and if necessary, the need for more tests. Vaccines  Your health care provider may  recommend certain vaccines, such as: Influenza vaccine. This is  recommended every year. Tetanus, diphtheria, and acellular pertussis (Tdap, Td) vaccine. You may need a Td booster every 10 years. Zoster vaccine. You may need this after age 68. Pneumococcal 13-valent conjugate (PCV13) vaccine. One dose is recommended after age 67. Pneumococcal polysaccharide (PPSV23) vaccine. One dose is recommended after age 5. Talk to your health care provider about which screenings and vaccines you need and how often you need them. This information is not intended to replace advice given to you by your health care provider. Make sure you discuss any questions you have with your health care provider. Document Released: 11/24/2015 Document Revised: 07/17/2016 Document Reviewed: 08/29/2015 Elsevier Interactive Patient Education  2017 [place] Prevention in the Home Falls can cause injuries. They can happen to people of all ages. There are many things you can do to make your home safe and to help prevent falls. What can I do on the outside of my home? Regularly fix the edges of walkways and driveways and fix any cracks. Remove anything that might make you trip as you walk through a door, such as a raised step or threshold. Trim any bushes or trees on the path to your home. Use bright outdoor lighting. Clear any walking paths of anything that might make someone trip, such as rocks or tools. Regularly check to see if handrails are loose or broken. Make sure that both sides of any steps have handrails. Any raised decks and porches should have guardrails on the edges. Have any leaves, snow, or ice cleared regularly. Use sand or salt on walking paths during winter. Clean up any spills in your garage right away. This includes oil or grease spills. What can I do in the bathroom? Use night lights. Install grab bars by the toilet and in the tub and shower. Do not use towel bars as grab bars. Use non-skid mats or decals in the tub or shower. If you need to sit down in  the shower, use a plastic, non-slip stool. Keep the floor dry. Clean up any water that spills on the floor as soon as it happens. Remove soap buildup in the tub or shower regularly. Attach bath mats securely with double-sided non-slip rug tape. Do not have throw rugs and other things on the floor that can make you trip. What can I do in the bedroom? Use night lights. Make sure that you have a light by your bed that is easy to reach. Do not use any sheets or blankets that are too big for your bed. They should not hang down onto the floor. Have a firm chair that has side arms. You can use this for support while you get dressed. Do not have throw rugs and other things on the floor that can make you trip. What can I do in the kitchen? Clean up any spills right away. Avoid walking on wet floors. Keep items that you use a lot in easy-to-reach places. If you need to reach something above you, use a strong step stool that has a grab bar. Keep electrical cords out of the way. Do not use floor polish or wax that makes floors slippery. If you must use wax, use non-skid floor wax. Do not have throw rugs and other things on the floor that can make you trip. What can I do with my stairs? Do not leave any items on the stairs. Make sure that there are handrails on both sides of  the stairs and use them. Fix handrails that are broken or loose. Make sure that handrails are as long as the stairways. Check any carpeting to make sure that it is firmly attached to the stairs. Fix any carpet that is loose or worn. Avoid having throw rugs at the top or bottom of the stairs. If you do have throw rugs, attach them to the floor with carpet tape. Make sure that you have a light switch at the top of the stairs and the bottom of the stairs. If you do not have them, ask someone to add them for you. What else can I do to help prevent falls? Wear shoes that: Do not have high heels. Have rubber bottoms. Are comfortable  and fit you well. Are closed at the toe. Do not wear sandals. If you use a stepladder: Make sure that it is fully opened. Do not climb a closed stepladder. Make sure that both sides of the stepladder are locked into place. Ask someone to hold it for you, if possible. Clearly mark and make sure that you can see: Any grab bars or handrails. First and last steps. Where the edge of each step is. Use tools that help you move around (mobility aids) if they are needed. These include: Canes. Walkers. Scooters. Crutches. Turn on the lights when you go into a dark area. Replace any light bulbs as soon as they burn out. Set up your furniture so you have a clear path. Avoid moving your furniture around. If any of your floors are uneven, fix them. If there are any pets around you, be aware of where they are. Review your medicines with your doctor. Some medicines can make you feel dizzy. This can increase your chance of falling. Ask your doctor what other things that you can do to help prevent falls. This information is not intended to replace advice given to you by your health care provider. Make sure you discuss any questions you have with your health care provider. Document Released: 08/24/2009 Document Revised: 04/04/2016 Document Reviewed: 12/02/2014 Elsevier Interactive Patient Education  2017 [employer].

## 2021-08-07 DIAGNOSIS — R809 Proteinuria, unspecified: Secondary | ICD-10-CM | POA: Diagnosis not present

## 2021-08-07 DIAGNOSIS — N184 Chronic kidney disease, stage 4 (severe): Secondary | ICD-10-CM | POA: Diagnosis not present

## 2021-08-07 DIAGNOSIS — I10 Essential (primary) hypertension: Secondary | ICD-10-CM | POA: Diagnosis not present

## 2021-08-08 DIAGNOSIS — N184 Chronic kidney disease, stage 4 (severe): Secondary | ICD-10-CM | POA: Diagnosis not present

## 2021-08-08 DIAGNOSIS — R809 Proteinuria, unspecified: Secondary | ICD-10-CM | POA: Diagnosis not present

## 2021-08-08 DIAGNOSIS — I10 Essential (primary) hypertension: Secondary | ICD-10-CM | POA: Diagnosis not present

## 2021-08-15 DIAGNOSIS — N281 Cyst of kidney, acquired: Secondary | ICD-10-CM | POA: Diagnosis not present

## 2021-08-15 DIAGNOSIS — N189 Chronic kidney disease, unspecified: Secondary | ICD-10-CM

## 2021-08-15 DIAGNOSIS — J449 Chronic obstructive pulmonary disease, unspecified: Secondary | ICD-10-CM | POA: Diagnosis not present

## 2021-08-15 DIAGNOSIS — I10 Essential (primary) hypertension: Secondary | ICD-10-CM | POA: Diagnosis not present

## 2021-08-15 DIAGNOSIS — D631 Anemia in chronic kidney disease: Secondary | ICD-10-CM | POA: Insufficient documentation

## 2021-08-15 DIAGNOSIS — N184 Chronic kidney disease, stage 4 (severe): Secondary | ICD-10-CM | POA: Diagnosis not present

## 2021-08-15 DIAGNOSIS — R809 Proteinuria, unspecified: Secondary | ICD-10-CM | POA: Diagnosis not present

## 2021-08-15 DIAGNOSIS — N2581 Secondary hyperparathyroidism of renal origin: Secondary | ICD-10-CM | POA: Insufficient documentation

## 2021-08-15 HISTORY — DX: Chronic kidney disease, unspecified: D63.1

## 2021-08-15 HISTORY — DX: Chronic kidney disease, unspecified: N18.9

## 2021-08-31 ENCOUNTER — Observation Stay
Admission: EM | Admit: 2021-08-31 | Discharge: 2021-09-01 | Disposition: A | Discharge status: Home / Self Care | Payer: Medicare Other | Attending: Internal Medicine | Admitting: Internal Medicine

## 2021-08-31 ENCOUNTER — Emergency Department: Payer: Medicare Other

## 2021-08-31 ENCOUNTER — Observation Stay: Payer: Medicare Other

## 2021-08-31 ENCOUNTER — Observation Stay
Admit: 2021-08-31 | Discharge: 2021-08-31 | Disposition: A | Discharge status: Home / Self Care | Payer: Medicare Other | Attending: Neurology | Admitting: Neurology

## 2021-08-31 ENCOUNTER — Other Ambulatory Visit: Payer: Self-pay

## 2021-08-31 ENCOUNTER — Encounter: Payer: Self-pay | Admitting: Internal Medicine

## 2021-08-31 DIAGNOSIS — F17210 Nicotine dependence, cigarettes, uncomplicated: Secondary | ICD-10-CM | POA: Insufficient documentation

## 2021-08-31 DIAGNOSIS — I639 Cerebral infarction, unspecified: Secondary | ICD-10-CM | POA: Diagnosis not present

## 2021-08-31 DIAGNOSIS — F17200 Nicotine dependence, unspecified, uncomplicated: Secondary | ICD-10-CM | POA: Diagnosis present

## 2021-08-31 DIAGNOSIS — J441 Chronic obstructive pulmonary disease with (acute) exacerbation: Secondary | ICD-10-CM | POA: Diagnosis present

## 2021-08-31 DIAGNOSIS — N138 Other obstructive and reflux uropathy: Secondary | ICD-10-CM | POA: Diagnosis present

## 2021-08-31 DIAGNOSIS — E875 Hyperkalemia: Secondary | ICD-10-CM | POA: Diagnosis not present

## 2021-08-31 DIAGNOSIS — Z79899 Other long term (current) drug therapy: Secondary | ICD-10-CM | POA: Diagnosis not present

## 2021-08-31 DIAGNOSIS — G459 Transient cerebral ischemic attack, unspecified: Secondary | ICD-10-CM | POA: Diagnosis present

## 2021-08-31 DIAGNOSIS — E039 Hypothyroidism, unspecified: Secondary | ICD-10-CM | POA: Diagnosis present

## 2021-08-31 DIAGNOSIS — E785 Hyperlipidemia, unspecified: Secondary | ICD-10-CM | POA: Diagnosis present

## 2021-08-31 DIAGNOSIS — R29898 Other symptoms and signs involving the musculoskeletal system: Secondary | ICD-10-CM | POA: Diagnosis not present

## 2021-08-31 DIAGNOSIS — Z8673 Personal history of transient ischemic attack (TIA), and cerebral infarction without residual deficits: Secondary | ICD-10-CM | POA: Diagnosis present

## 2021-08-31 DIAGNOSIS — J449 Chronic obstructive pulmonary disease, unspecified: Secondary | ICD-10-CM | POA: Diagnosis not present

## 2021-08-31 DIAGNOSIS — N184 Chronic kidney disease, stage 4 (severe): Secondary | ICD-10-CM | POA: Diagnosis present

## 2021-08-31 DIAGNOSIS — U071 COVID-19: Secondary | ICD-10-CM | POA: Diagnosis present

## 2021-08-31 DIAGNOSIS — R42 Dizziness and giddiness: Secondary | ICD-10-CM | POA: Diagnosis not present

## 2021-08-31 DIAGNOSIS — I10 Essential (primary) hypertension: Secondary | ICD-10-CM | POA: Diagnosis not present

## 2021-08-31 DIAGNOSIS — I129 Hypertensive chronic kidney disease with stage 1 through stage 4 chronic kidney disease, or unspecified chronic kidney disease: Secondary | ICD-10-CM | POA: Diagnosis not present

## 2021-08-31 DIAGNOSIS — I6621 Occlusion and stenosis of right posterior cerebral artery: Secondary | ICD-10-CM | POA: Diagnosis not present

## 2021-08-31 DIAGNOSIS — I6389 Other cerebral infarction: Secondary | ICD-10-CM | POA: Diagnosis not present

## 2021-08-31 DIAGNOSIS — N401 Enlarged prostate with lower urinary tract symptoms: Secondary | ICD-10-CM

## 2021-08-31 DIAGNOSIS — R200 Anesthesia of skin: Secondary | ICD-10-CM | POA: Diagnosis not present

## 2021-08-31 DIAGNOSIS — R531 Weakness: Secondary | ICD-10-CM | POA: Diagnosis not present

## 2021-08-31 DIAGNOSIS — R29818 Other symptoms and signs involving the nervous system: Secondary | ICD-10-CM | POA: Diagnosis not present

## 2021-08-31 HISTORY — DX: COVID-19: U07.1

## 2021-08-31 LAB — RESP PANEL BY RT-PCR (FLU A&B, COVID) ARPGX2
Influenza A by PCR: NEGATIVE
Influenza B by PCR: NEGATIVE
SARS Coronavirus 2 by RT PCR: POSITIVE — AB

## 2021-08-31 LAB — CBG MONITORING, ED: Glucose-Capillary: 92 mg/dL (ref 70–99)

## 2021-08-31 LAB — COMPREHENSIVE METABOLIC PANEL
ALT: 11 U/L (ref 0–44)
AST: 19 U/L (ref 15–41)
Albumin: 3.6 g/dL (ref 3.5–5.0)
Alkaline Phosphatase: 98 U/L (ref 38–126)
Anion gap: 5 (ref 5–15)
BUN: 43 mg/dL — ABNORMAL HIGH (ref 8–23)
CO2: 24 mmol/L (ref 22–32)
Calcium: 8.9 mg/dL (ref 8.9–10.3)
Chloride: 112 mmol/L — ABNORMAL HIGH (ref 98–111)
Creatinine, Ser: 3.99 mg/dL — ABNORMAL HIGH (ref 0.61–1.24)
GFR, Estimated: 15 mL/min — ABNORMAL LOW (ref >60)
Glucose, Bld: 85 mg/dL (ref 70–99)
Potassium: 5.4 mmol/L — ABNORMAL HIGH (ref 3.5–5.1)
Sodium: 141 mmol/L (ref 135–145)
Total Bilirubin: 0.8 mg/dL (ref 0.3–1.2)
Total Protein: 7.1 g/dL (ref 6.5–8.1)

## 2021-08-31 LAB — ECHOCARDIOGRAM COMPLETE
AR max vel: 1.83 cm2
AV Area VTI: 1.97 cm2
AV Area mean vel: 1.86 cm2
AV Mean grad: 5 mmHg
AV Peak grad: 8.9 mmHg
Ao pk vel: 1.49 m/s
Area-P 1/2: 2.44 cm2
Height: 71 in
S' Lateral: 2.96 cm
Weight: 3360 oz

## 2021-08-31 LAB — CBC
HCT: 36.1 % — ABNORMAL LOW (ref 39.0–52.0)
Hemoglobin: 12.2 g/dL — ABNORMAL LOW (ref 13.0–17.0)
MCH: 33.8 pg (ref 26.0–34.0)
MCHC: 33.8 g/dL (ref 30.0–36.0)
MCV: 100 fL (ref 80.0–100.0)
Platelets: 173 10*3/uL (ref 150–400)
RBC: 3.61 MIL/uL — ABNORMAL LOW (ref 4.22–5.81)
RDW: 13.6 % (ref 11.5–15.5)
WBC: 6.4 10*3/uL (ref 4.0–10.5)
nRBC: 0 % (ref 0.0–0.2)

## 2021-08-31 LAB — DIFFERENTIAL
Abs Immature Granulocytes: 0.01 10*3/uL (ref 0.00–0.07)
Basophils Absolute: 0 10*3/uL (ref 0.0–0.1)
Basophils Relative: 1 %
Eosinophils Absolute: 0.5 10*3/uL (ref 0.0–0.5)
Eosinophils Relative: 7 %
Immature Granulocytes: 0 %
Lymphocytes Relative: 27 %
Lymphs Abs: 1.7 10*3/uL (ref 0.7–4.0)
Monocytes Absolute: 0.6 10*3/uL (ref 0.1–1.0)
Monocytes Relative: 9 %
Neutro Abs: 3.6 10*3/uL (ref 1.7–7.7)
Neutrophils Relative %: 56 %

## 2021-08-31 LAB — HEMOGLOBIN A1C
Hgb A1c MFr Bld: 6 % — ABNORMAL HIGH (ref 4.8–5.6)
Mean Plasma Glucose: 126 mg/dL

## 2021-08-31 LAB — I-STAT CREATININE, ED: Creatinine, Ser: 4.2 mg/dL — ABNORMAL HIGH (ref 0.61–1.24)

## 2021-08-31 LAB — PROTIME-INR
INR: 1 (ref 0.8–1.2)
Prothrombin Time: 13.3 seconds (ref 11.4–15.2)

## 2021-08-31 LAB — APTT
aPTT: 30 seconds (ref 24–36)
aPTT: 31 seconds (ref 24–36)

## 2021-08-31 LAB — TSH: TSH: 28.489 u[IU]/mL — ABNORMAL HIGH (ref 0.350–4.500)

## 2021-08-31 LAB — HIV ANTIBODY (ROUTINE TESTING W REFLEX): HIV Screen 4th Generation wRfx: NONREACTIVE

## 2021-08-31 IMAGING — MR MR MRA NECK W/O CM
1 series · 34 of 48 positions shown · non-contrast
Comparison: Noncontrast head CT [DATE].

CLINICAL DATA: Neuro deficit, acute, stroke suspected. Additional
history provided: Patient reports dizziness with arm/face numbness.

EXAM:
MRI HEAD WITHOUT CONTRAST
MRA HEAD WITHOUT CONTRAST
MRA NECK WITHOUT CONTRAST
TECHNIQUE: Multiplanar, multi-echo pulse sequences of the brain and surrounding
structures were acquired without intravenous contrast. Angiographic
images of the Circle of Willis were acquired using MRA technique
without intravenous contrast. Angiographic images of the neck were
acquired using MRA technique without intravenous contrast. Carotid
stenosis measurements (when applicable) are obtained utilizing
NASCET criteria, using the distal internal carotid diameter as the
denominator.

[Series 9: [id number] neck · axial · 0.6mm · 0.52mm/px · z∈[-185,-110]mm · 34 of 133 slices shown]
[im 1/133]
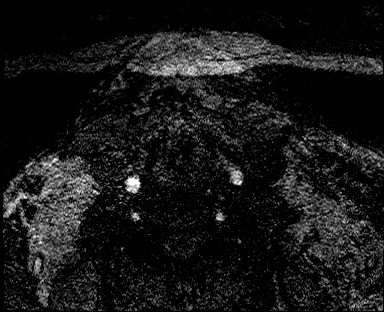
[im 3/133]
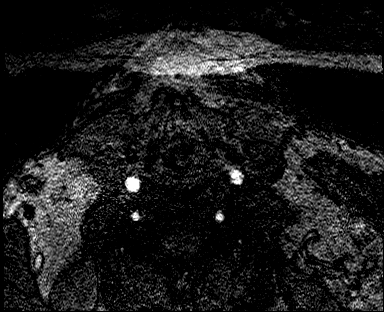
[im 6/133]
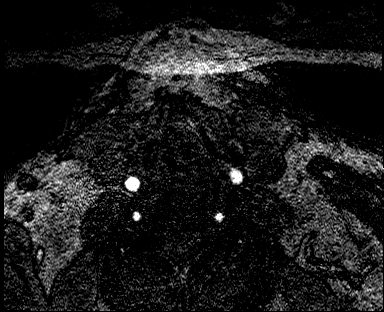
[im 9/133]
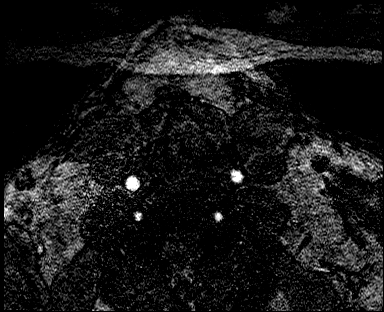
[im 12/133]
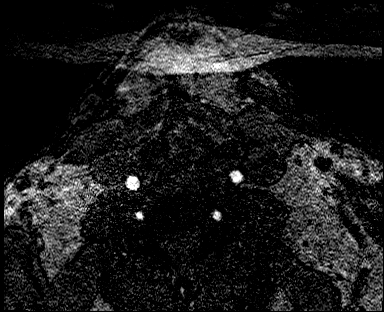
[im 15/133]
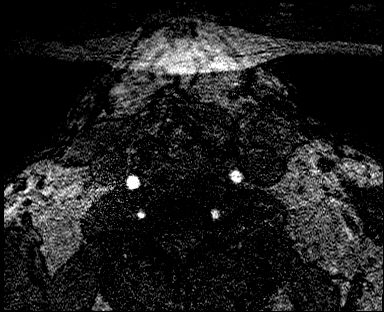
[im 17/133]
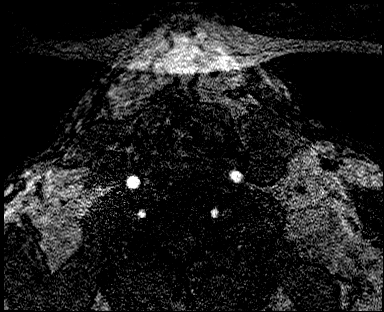
[im 20/133]
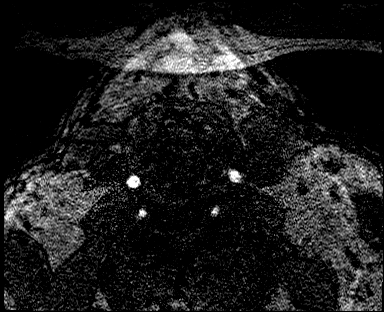
[im 23/133]
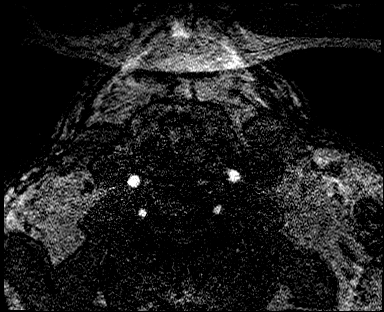
[im 26/133]
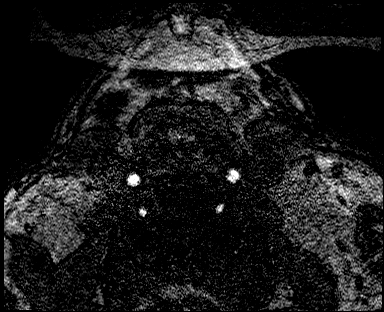
[im 29/133]
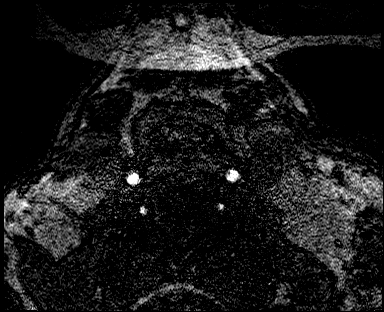
[im 31/133]
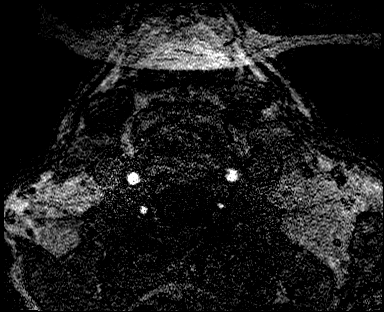
[im 34/133]
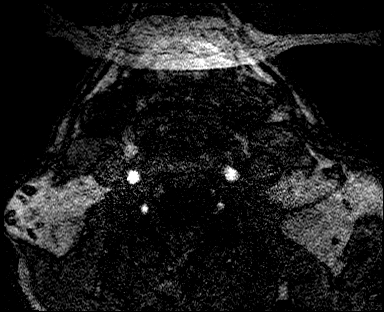
[im 37/133]
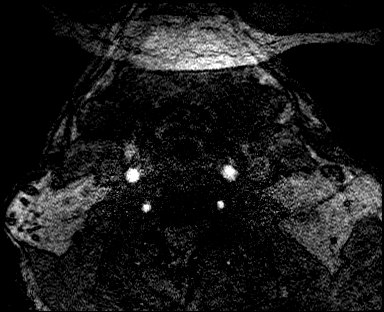
[im 40/133]
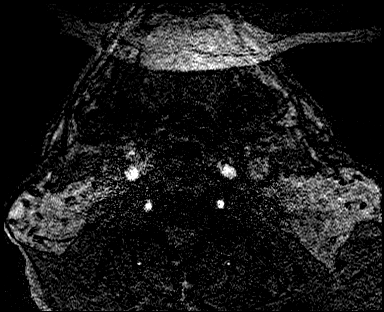
[im 43/133]
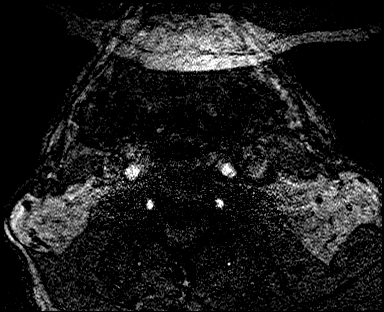
[im 45/133]
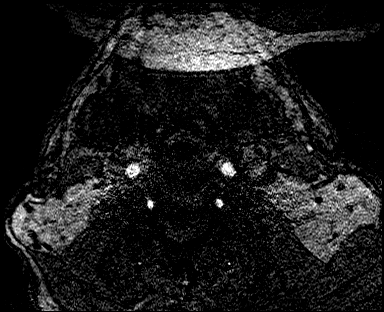
[im 48/133]
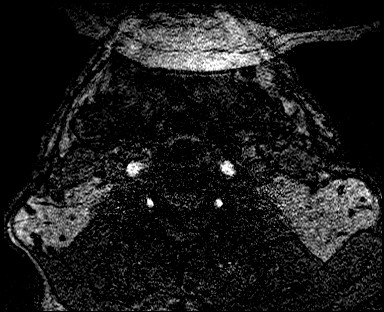
[im 51/133]
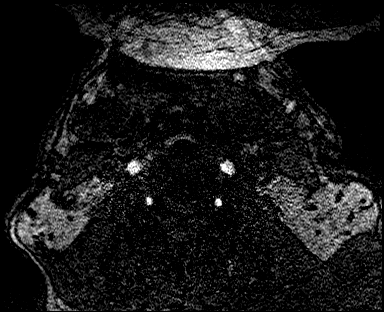
[im 54/133]
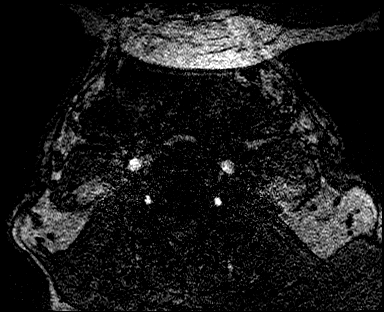
[im 57/133]
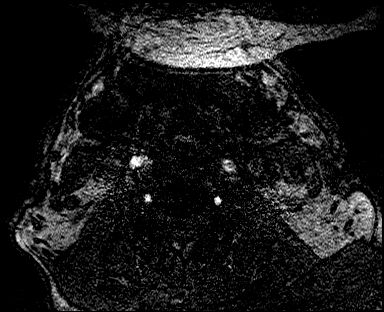
[im 59/133]
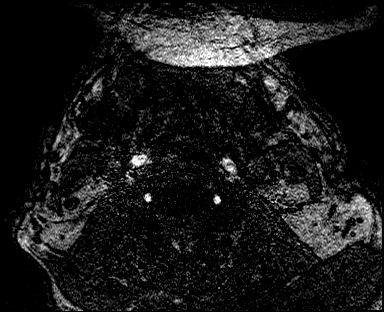
[im 62/133]
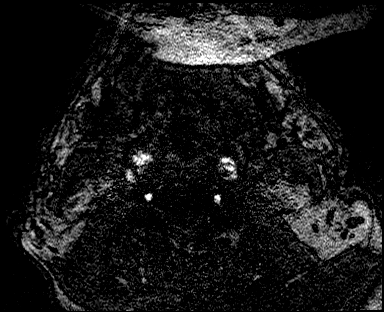
[im 65/133]
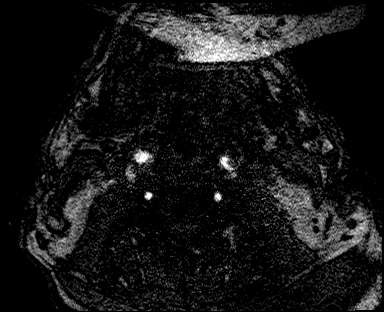
[im 68/133]
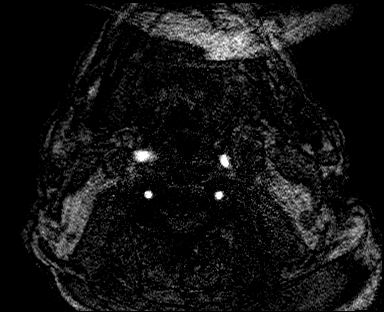
[im 71/133]
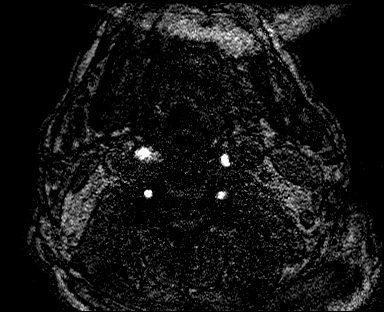
[im 74/133]
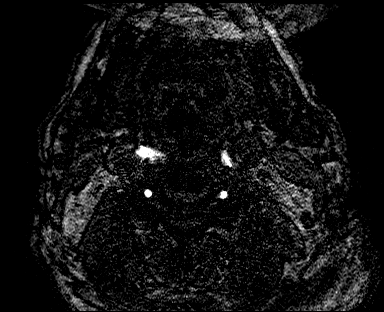
[im 76/133]
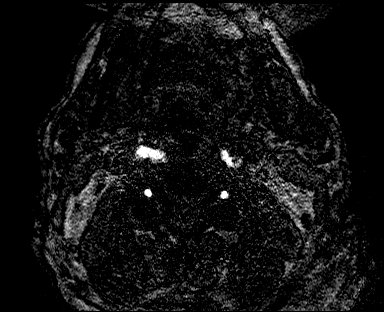
[im 79/133]
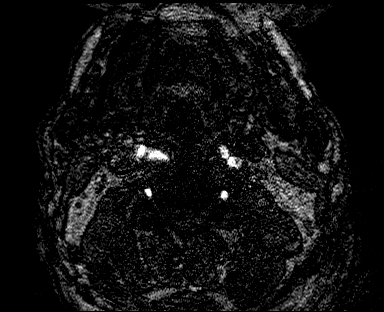
[im 82/133]
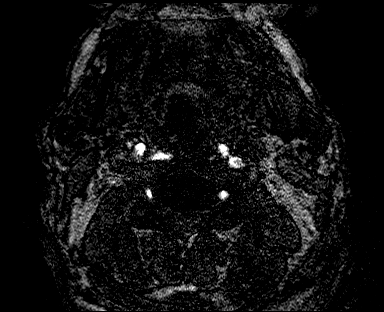
[im 93/133]
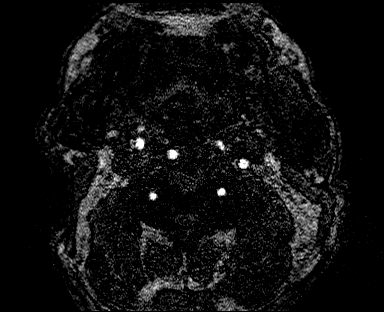
[im 110/133]
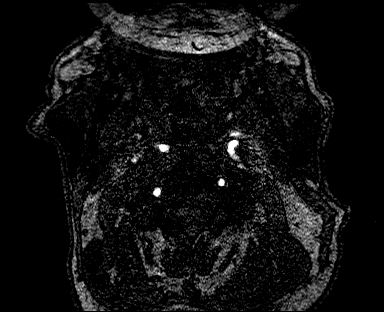
[im 113/133]
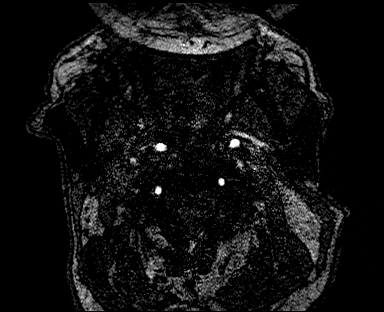
[im 127/133]
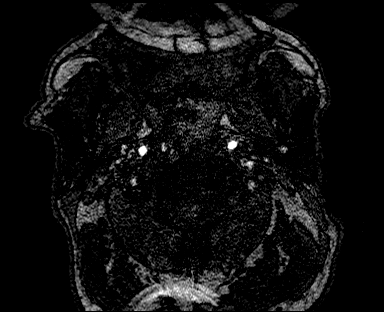

[34 of 48 positions shown; findings below may reference images not displayed]

FINDINGS: MRI HEAD FINDINGS

Brain:

Intermittently motion degraded exam. Most notably, there is moderate
motion degradation of the axial T2 TSE sequence.

Cerebral volume is normal for age.

Minimal multifocal T2 FLAIR hyperintense signal abnormality within
the cerebral white matter, nonspecific but compatible with chronic
small vessel ischemic disease.

There is no acute infarct.

No evidence of an intracranial mass.

No chronic intracranial blood products.

No extra-axial fluid collection.

No midline shift.

Vascular: Maintained flow voids within the proximal large arterial
vessels.

Skull and upper cervical spine: No focal suspicious marrow lesion.
Incompletely assessed cervical spondylosis.

Sinuses/Orbits: Visualized orbits show no acute finding. Mild
scattered mucosal thickening within the left frontal and bilateral
ethmoid sinuses at the imaged levels.

Other: Trace fluid within the bilateral mastoid air cells.

MRA HEAD FINDINGS

Mildly motion degraded exam.

Anterior circulation:

The intracranial internal carotid arteries are patent. The M1 middle
cerebral arteries are patent. No M2 proximal branch occlusion or
high-grade proximal stenosis is identified. The anterior cerebral
arteries are patent.

1-2 mm vascular protrusion arising from the pre cavernous right
internal carotid artery, which which may reflect atherosclerotic
irregularity or a small aneurysm (series [2V], image 14).

2-3 mm broad-based inferiorly projecting aneurysm arising from the
cavernous left internal carotid artery (for instance as seen on
series [2V], image 4).

Posterior circulation:

The intracranial vertebral arteries are patent. The basilar artery
is patent. The posterior cerebral arteries are patent. Severe
stenosis within the proximal right posterior cerebral artery at the
P1/P2 junction. Severe stenosis within left posterior cerebral
artery at the P2/P3 junction. Posterior communicating arteries are
diminutive or absent bilaterally.

Anatomic variants: As described.

MRA NECK FINDINGS

Moderately motion degraded examination.

The proximal common carotid arteries and proximal vertebral arteries
are excluded from the field of view. The distal cervical internal
carotid arteries and cervical vertebral arteries are excluded from
the field of view.

The visualized common carotid, internal carotid and vertebral
arteries are patent within the neck. Within the limitations of
motion degradation, there is no appreciable hemodynamically
significant stenosis (50% or greater) within these vessels at the
imaged levels.
IMPRESSION: MRI brain:

1. Intermittently motion degraded exam, as described.
2. No evidence of acute intracranial abnormality.
3. Minimal chronic small vessel ischemic changes within the cerebral
white matter.
4. Mild left frontal and bilateral ethmoid sinus mucosal thickening.
5. Trace fluid within the bilateral mastoid air cells.

MRA head:

1. Mildly motion degraded exam.
2. Severe stenosis within the proximal right posterior cerebral
artery at the P1/P2 junction.
3. Severe stenosis within the left posterior cerebral artery at the
P2/P3 junction.
4. 2-3 mm broad-based inferiorly projecting aneurysm arising from
the cavernous left internal carotid artery.
5. 1-2 mm vascular protrusion arising from the pre-cavernous right
internal carotid artery, which may reflect atherosclerotic
lobulation or an additional small aneurysm.

MRA neck:

1. Moderately motion degraded exam.
2. Portions of the proximal common carotid and vertebral arteries
are excluded from the field of view. Additionally, portions of the
distal cervical internal carotid and cervical vertebral arteries are
excluded from the field of view.
3. Within the limitations of motion degradation, there is no
appreciable hemodynamically significant stenosis of the common
carotid, cervical internal carotid or cervical vertebral arteries at
the imaged levels.

## 2021-08-31 IMAGING — MR MR MRA HEAD W/O CM
1 series · 17 of 48 positions shown · non-contrast
Comparison: Noncontrast head CT [DATE].

CLINICAL DATA: Neuro deficit, acute, stroke suspected. Additional
history provided: Patient reports dizziness with arm/face numbness.

EXAM:
MRI HEAD WITHOUT CONTRAST
MRA HEAD WITHOUT CONTRAST
MRA NECK WITHOUT CONTRAST
TECHNIQUE: Multiplanar, multi-echo pulse sequences of the brain and surrounding
structures were acquired without intravenous contrast. Angiographic
images of the Circle of Willis were acquired using MRA technique
without intravenous contrast. Angiographic images of the neck were
acquired using MRA technique without intravenous contrast. Carotid
stenosis measurements (when applicable) are obtained utilizing
NASCET criteria, using the distal internal carotid diameter as the
denominator.

[Series 5: 3d cow · axial · 0.5mm · 0.41mm/px · z∈[-79,+3]mm · 17 of 172 slices shown]
[im 1/172]
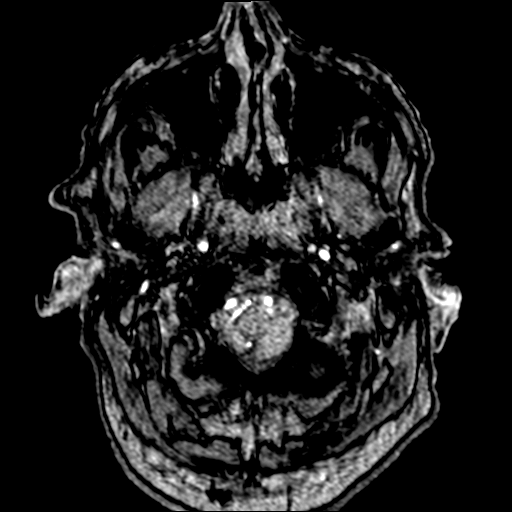
[im 4/172]
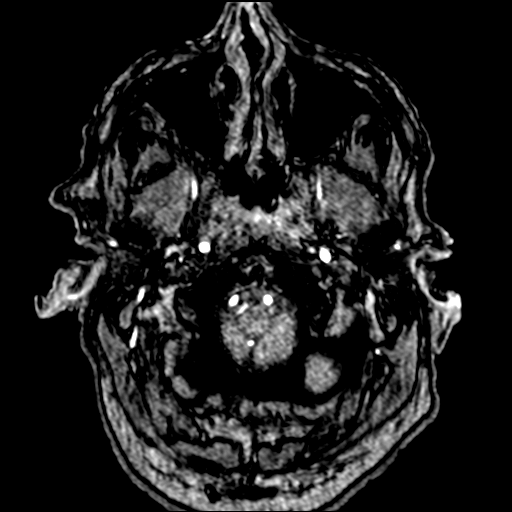
[im 8/172]
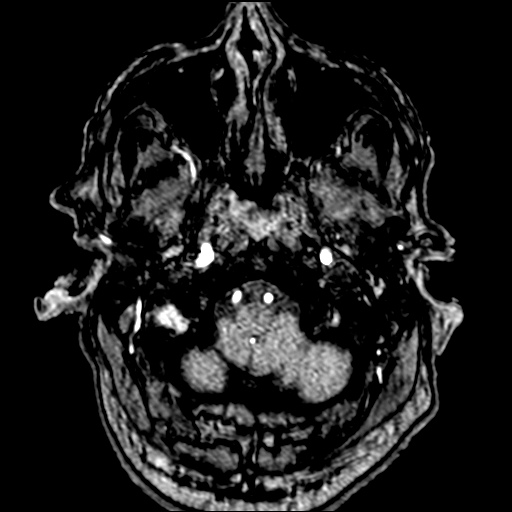
[im 11/172]
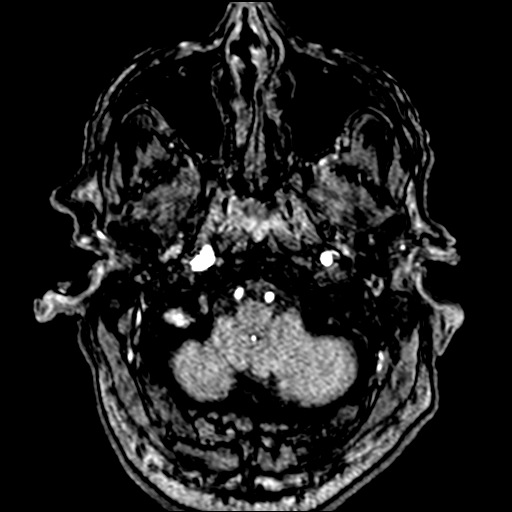
[im 15/172]
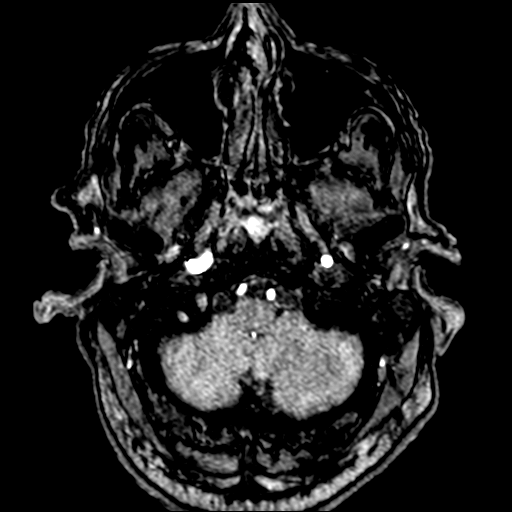
[im 19/172]
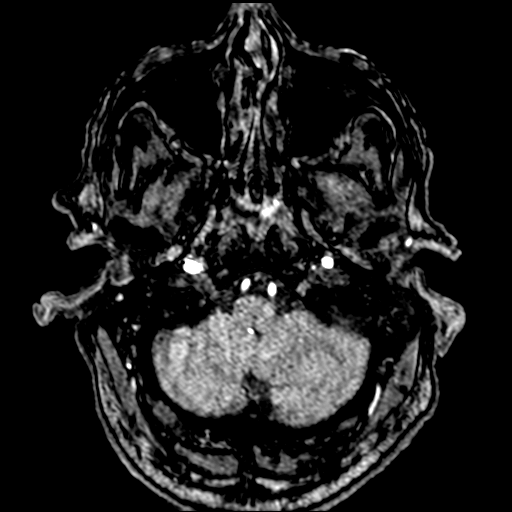
[im 22/172]
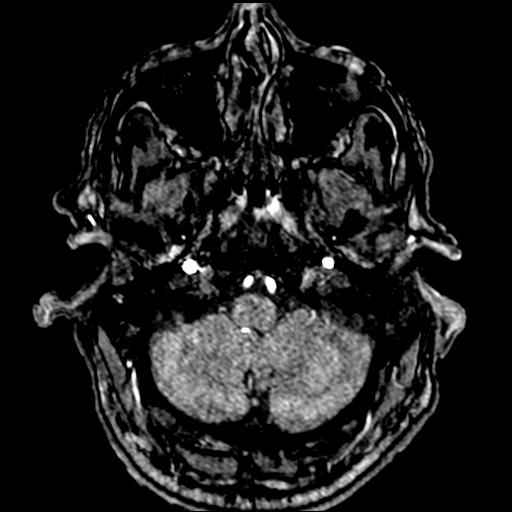
[im 30/172]
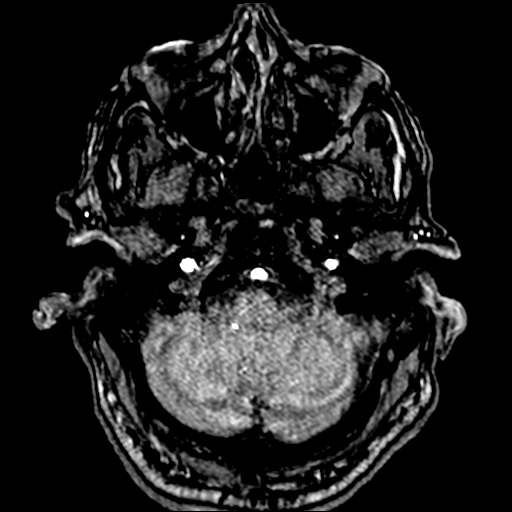
[im 33/172]
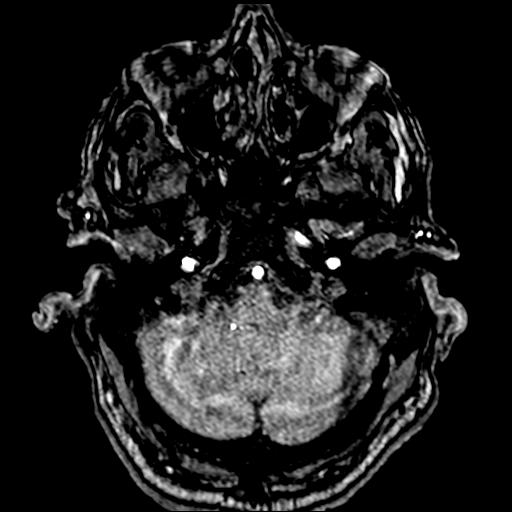
[im 55/172]
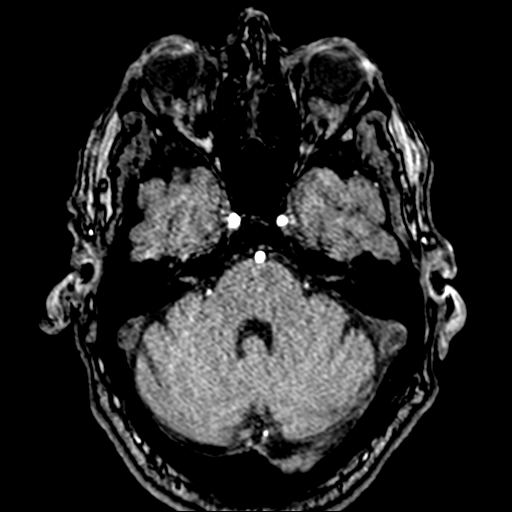
[im 77/172]
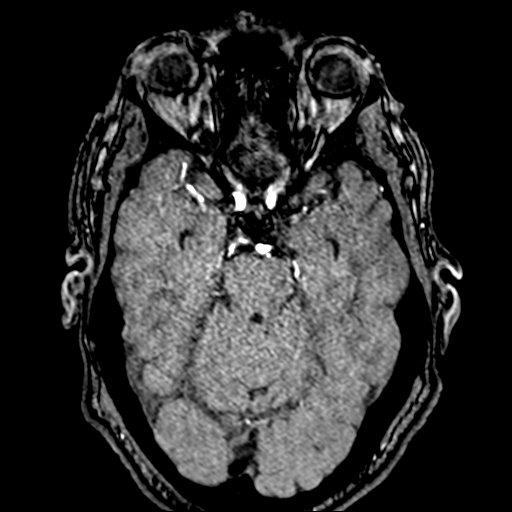
[im 88/172]
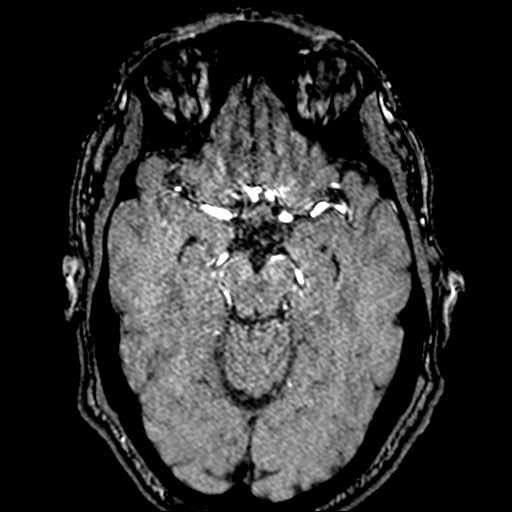
[im 99/172]
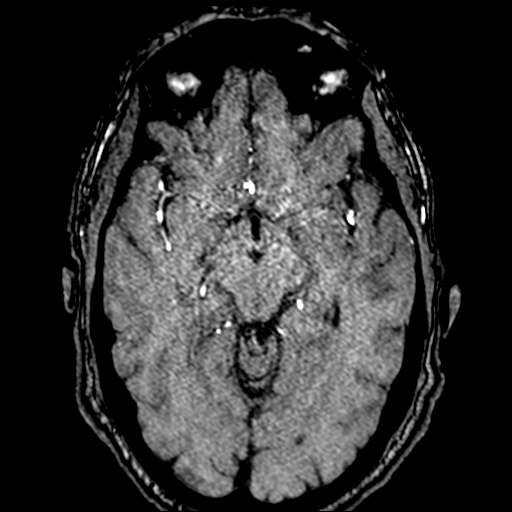
[im 121/172]
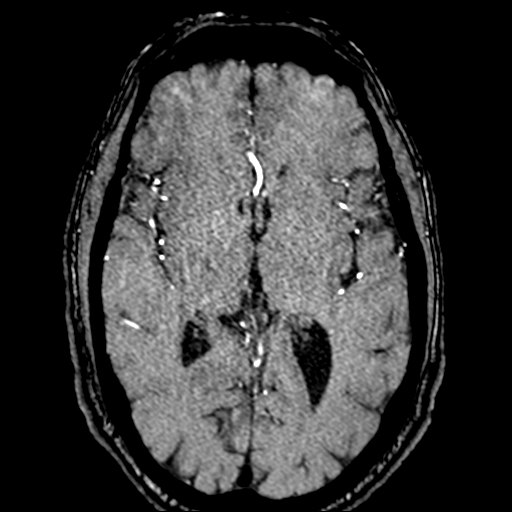
[im 142/172]
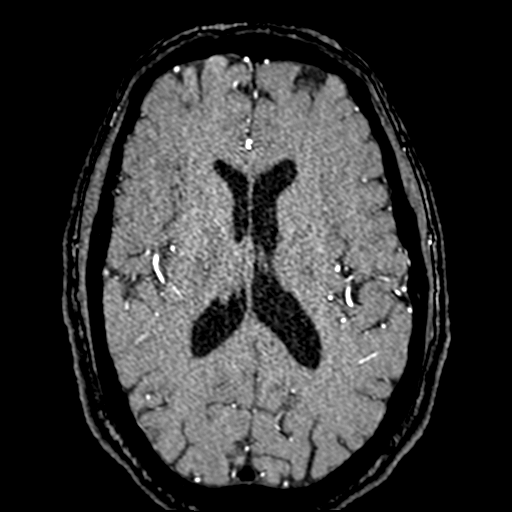
[im 146/172]
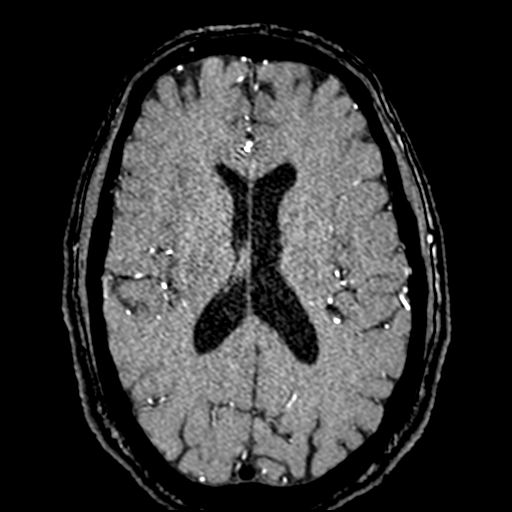
[im 164/172]
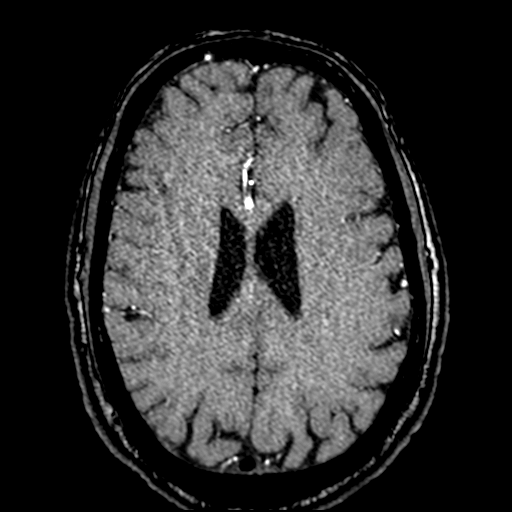

[17 of 48 positions shown; findings below may reference images not displayed]

FINDINGS: MRI HEAD FINDINGS

Brain:

Intermittently motion degraded exam. Most notably, there is moderate
motion degradation of the axial T2 TSE sequence.

Cerebral volume is normal for age.

Minimal multifocal T2 FLAIR hyperintense signal abnormality within
the cerebral white matter, nonspecific but compatible with chronic
small vessel ischemic disease.

There is no acute infarct.

No evidence of an intracranial mass.

No chronic intracranial blood products.

No extra-axial fluid collection.

No midline shift.

Vascular: Maintained flow voids within the proximal large arterial
vessels.

Skull and upper cervical spine: No focal suspicious marrow lesion.
Incompletely assessed cervical spondylosis.

Sinuses/Orbits: Visualized orbits show no acute finding. Mild
scattered mucosal thickening within the left frontal and bilateral
ethmoid sinuses at the imaged levels.

Other: Trace fluid within the bilateral mastoid air cells.

MRA HEAD FINDINGS

Mildly motion degraded exam.

Anterior circulation:

The intracranial internal carotid arteries are patent. The M1 middle
cerebral arteries are patent. No M2 proximal branch occlusion or
high-grade proximal stenosis is identified. The anterior cerebral
arteries are patent.

1-2 mm vascular protrusion arising from the pre cavernous right
internal carotid artery, which which may reflect atherosclerotic
irregularity or a small aneurysm (series [2V], image 14).

2-3 mm broad-based inferiorly projecting aneurysm arising from the
cavernous left internal carotid artery (for instance as seen on
series [2V], image 4).

Posterior circulation:

The intracranial vertebral arteries are patent. The basilar artery
is patent. The posterior cerebral arteries are patent. Severe
stenosis within the proximal right posterior cerebral artery at the
P1/P2 junction. Severe stenosis within left posterior cerebral
artery at the P2/P3 junction. Posterior communicating arteries are
diminutive or absent bilaterally.

Anatomic variants: As described.

MRA NECK FINDINGS

Moderately motion degraded examination.

The proximal common carotid arteries and proximal vertebral arteries
are excluded from the field of view. The distal cervical internal
carotid arteries and cervical vertebral arteries are excluded from
the field of view.

The visualized common carotid, internal carotid and vertebral
arteries are patent within the neck. Within the limitations of
motion degradation, there is no appreciable hemodynamically
significant stenosis (50% or greater) within these vessels at the
imaged levels.
IMPRESSION: MRI brain:

1. Intermittently motion degraded exam, as described.
2. No evidence of acute intracranial abnormality.
3. Minimal chronic small vessel ischemic changes within the cerebral
white matter.
4. Mild left frontal and bilateral ethmoid sinus mucosal thickening.
5. Trace fluid within the bilateral mastoid air cells.

MRA head:

1. Mildly motion degraded exam.
2. Severe stenosis within the proximal right posterior cerebral
artery at the P1/P2 junction.
3. Severe stenosis within the left posterior cerebral artery at the
P2/P3 junction.
4. 2-3 mm broad-based inferiorly projecting aneurysm arising from
the cavernous left internal carotid artery.
5. 1-2 mm vascular protrusion arising from the pre-cavernous right
internal carotid artery, which may reflect atherosclerotic
lobulation or an additional small aneurysm.

MRA neck:

1. Moderately motion degraded exam.
2. Portions of the proximal common carotid and vertebral arteries
are excluded from the field of view. Additionally, portions of the
distal cervical internal carotid and cervical vertebral arteries are
excluded from the field of view.
3. Within the limitations of motion degradation, there is no
appreciable hemodynamically significant stenosis of the common
carotid, cervical internal carotid or cervical vertebral arteries at
the imaged levels.

## 2021-08-31 IMAGING — DX DG CHEST 1V PORT
2 series · 2 of 2 positions shown · non-contrast
Comparison: None

CLINICAL DATA: A 69-year-old COVID positive male presents for
weaknessw.

EXAM:
PORTABLE CHEST 1 VIEW

[chest ap (1 of 2)]
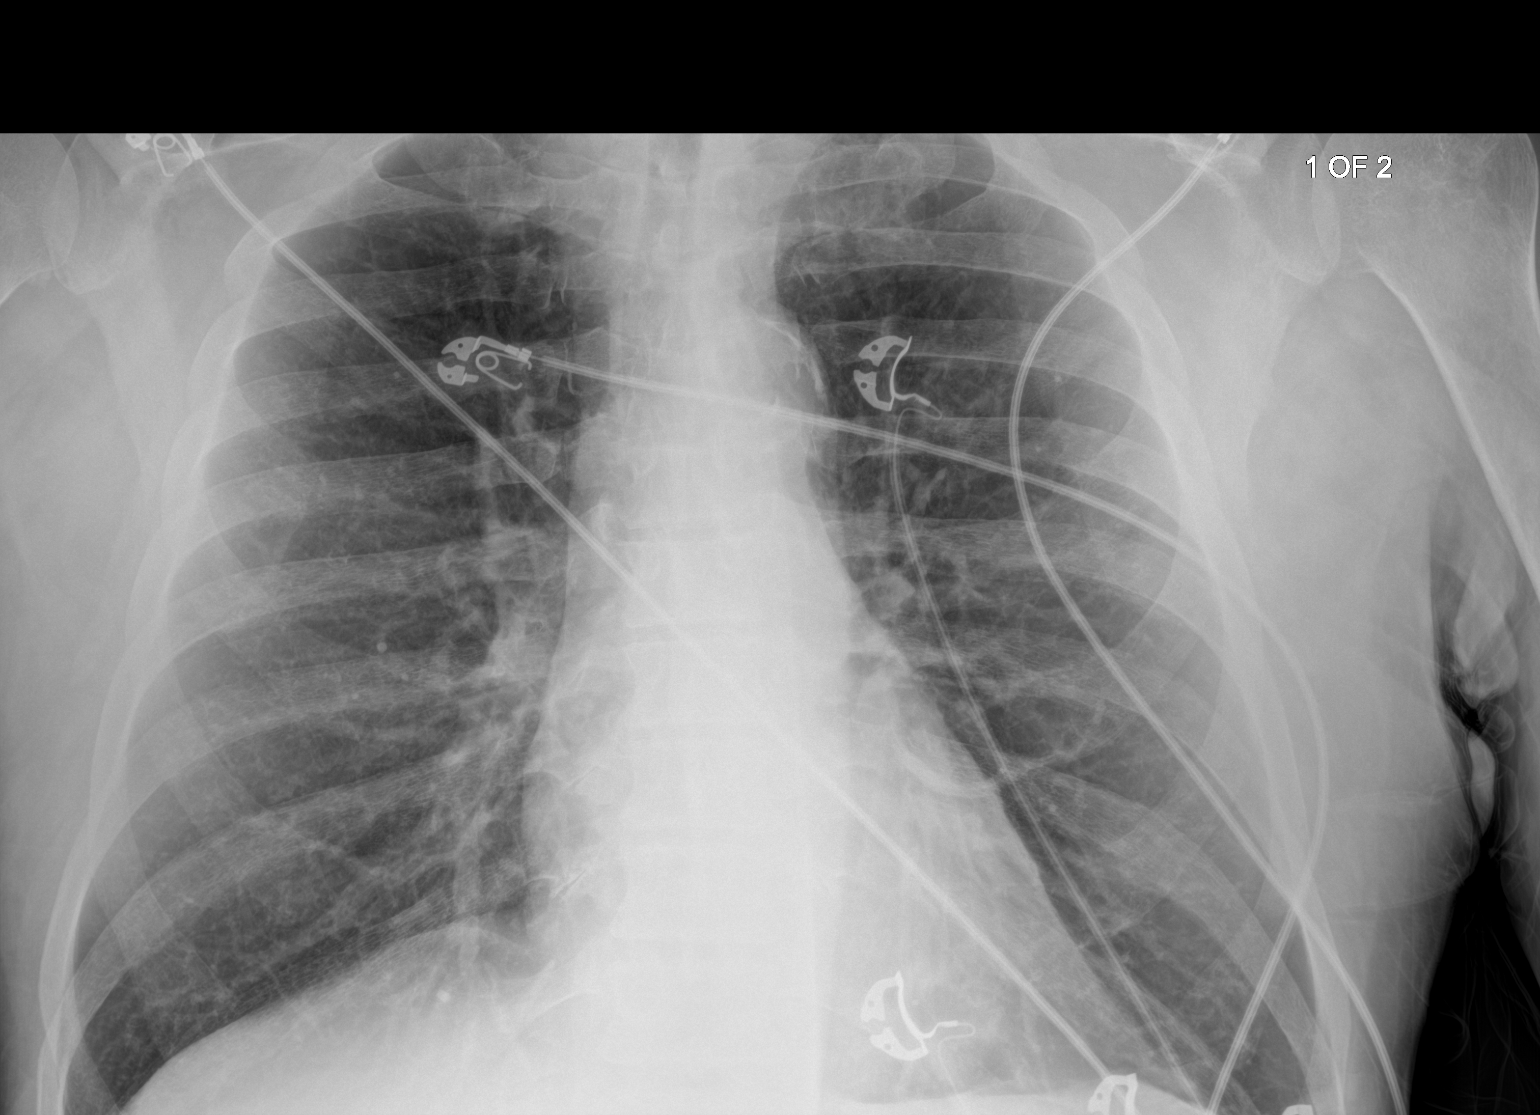

[chest ap (2 of 2)]
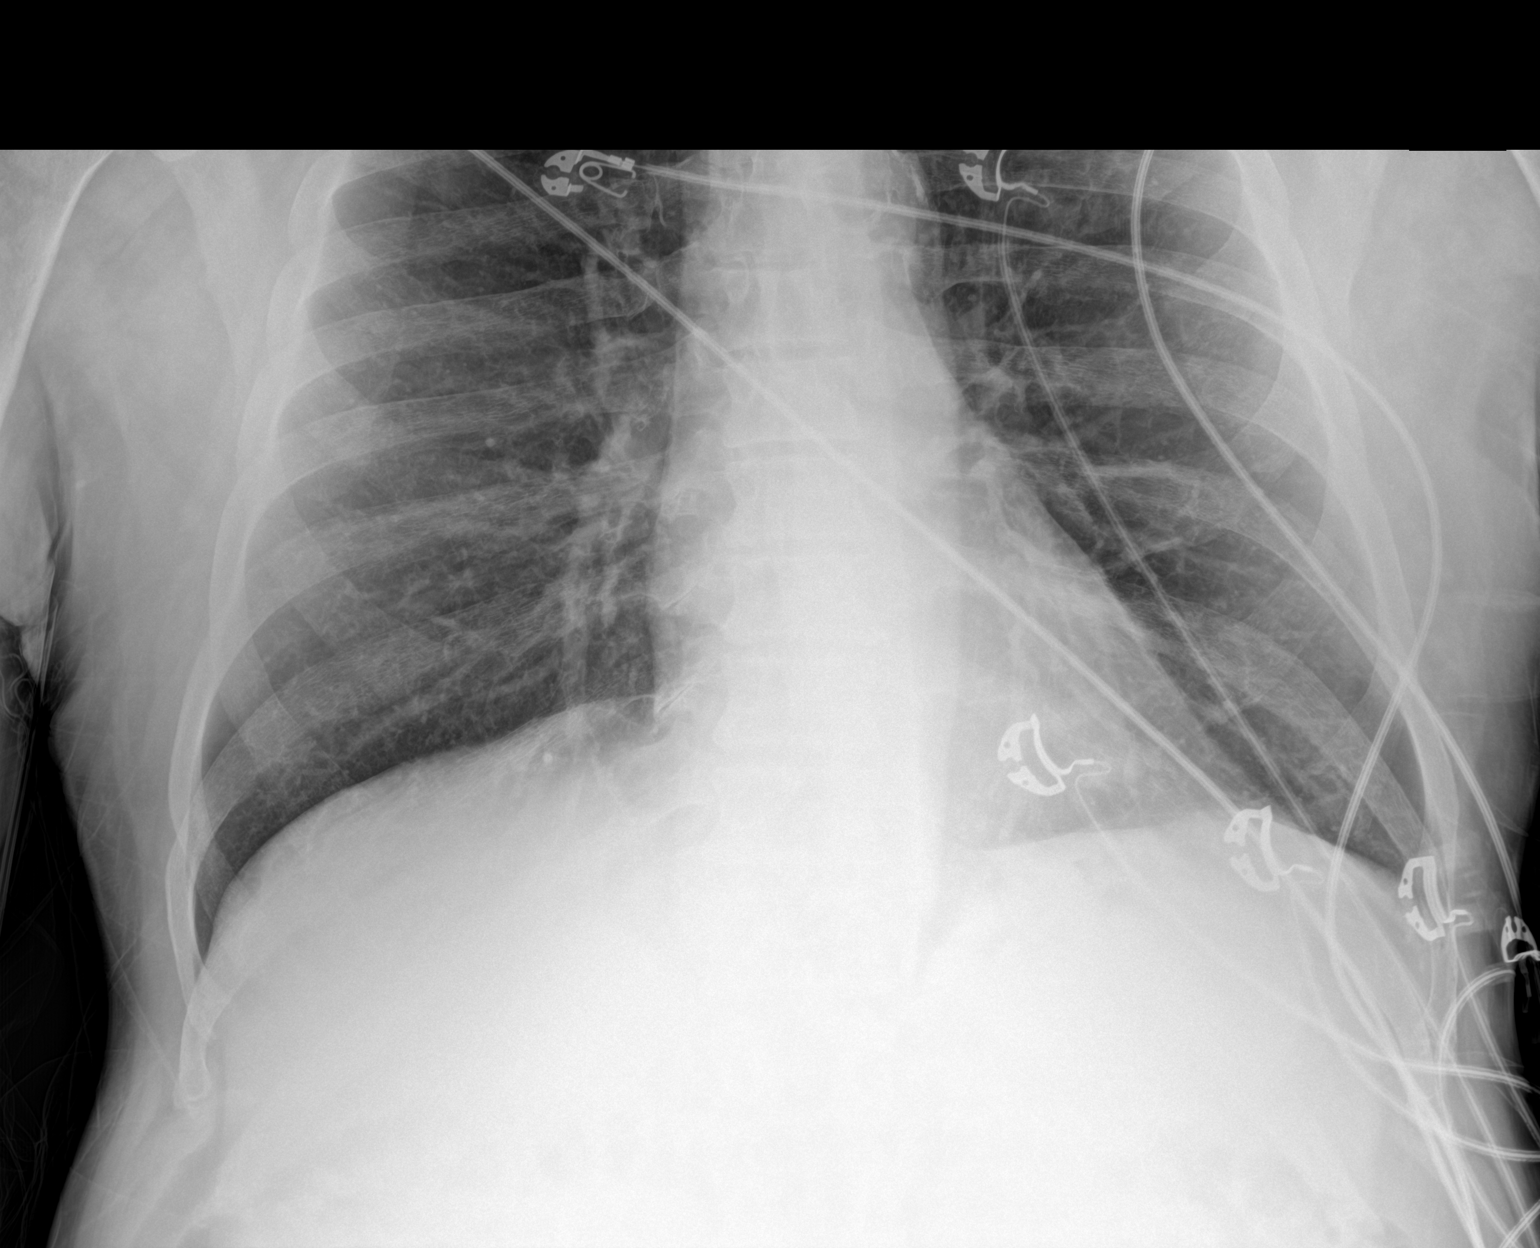

[2 of 2 positions shown; findings below may reference images not displayed]

FINDINGS: EKG leads project over the chest. Trachea is midline.
Cardiomediastinal contours and hilar structures are normal. Lungs
are clear. No pneumothorax or sign of pleural effusion on frontal
radiograph.

On limited assessment no acute skeletal process.
IMPRESSION: No acute cardiopulmonary disease.

## 2021-08-31 IMAGING — CT CT HEAD CODE STROKE
3 series · 15 of 47 positions shown, 18 images · non-contrast
Comparison: None.

CLINICAL DATA: Code stroke. Acute neuro deficit. Left-sided
weakness and dizziness

EXAM:
CT HEAD WITHOUT CONTRAST
TECHNIQUE: Contiguous axial images were obtained from the base of the skull
through the vertex without intravenous contrast.

[Series 4: head wo · axial · 0.48mm/px · z∈[-149,-19]mm · 9 of 32 slices shown, 12 images]
[im 3/32  brain]
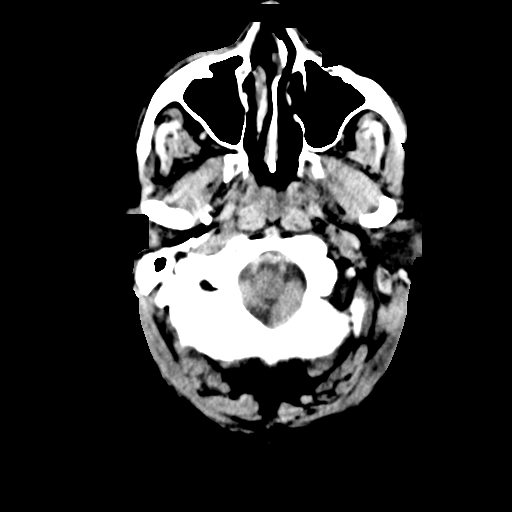
[im 3/32  bone]
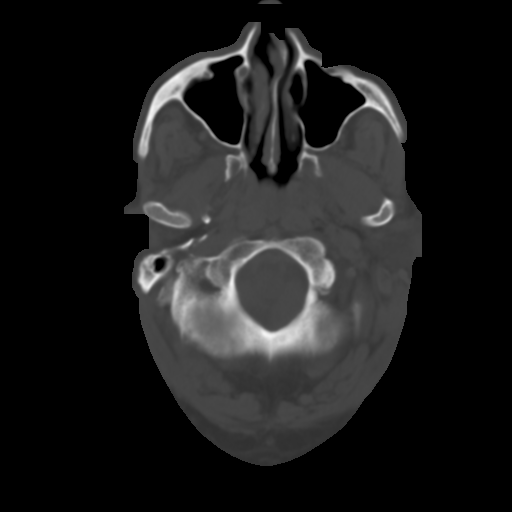
[im 6/32  brain]
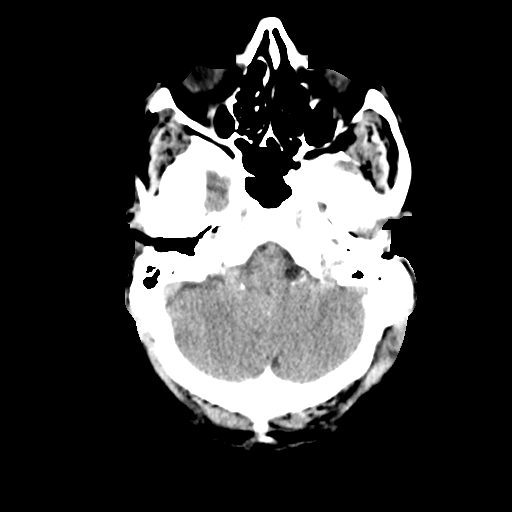
[im 9/32  brain]
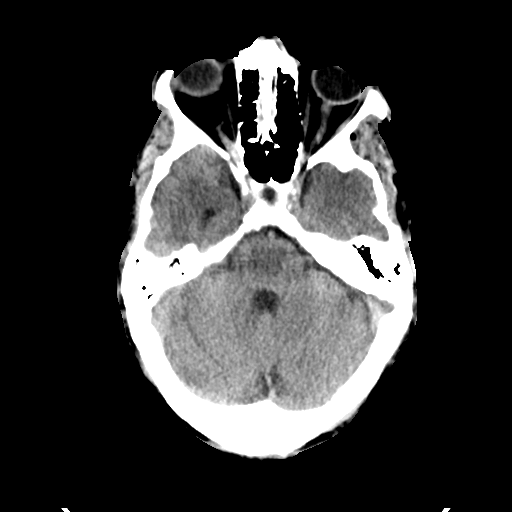
[im 12/32  brain]
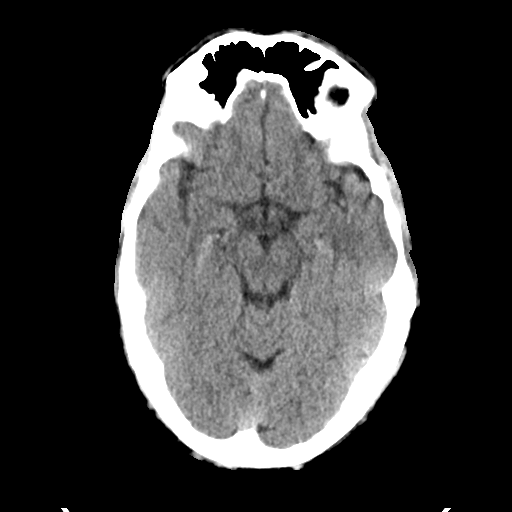
[im 17/32  brain]
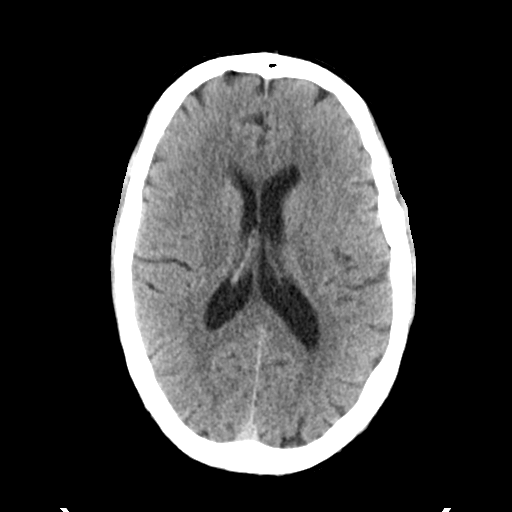
[im 17/32  bone]
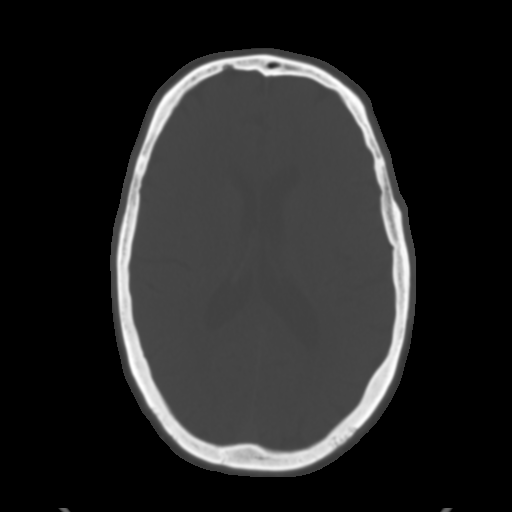
[im 20/32  brain]
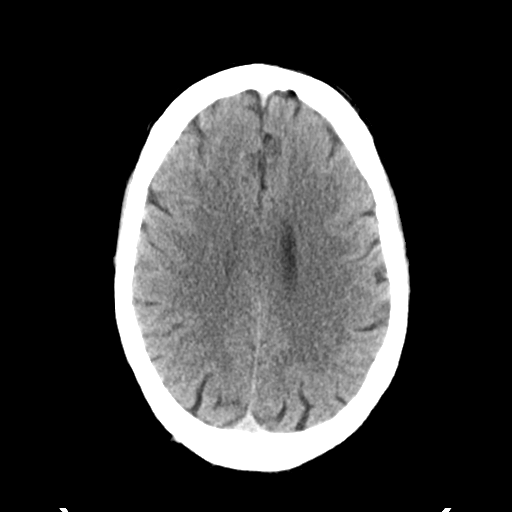
[im 23/32  brain]
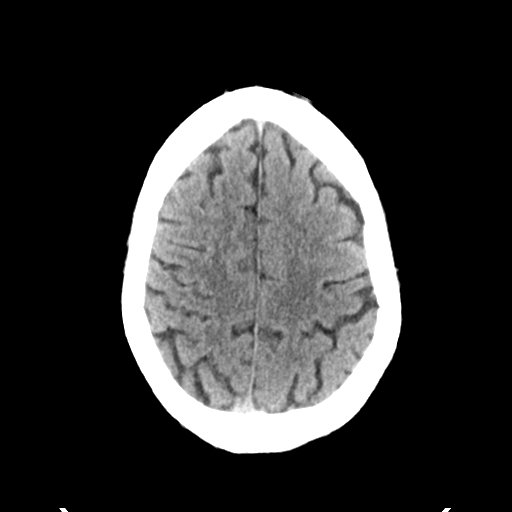
[im 26/32  brain]
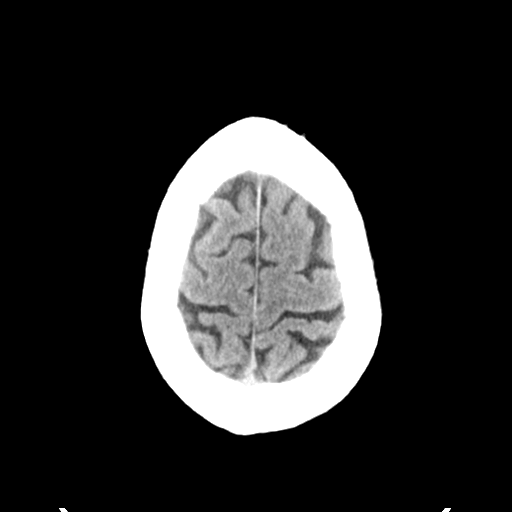
[im 29/32  brain]
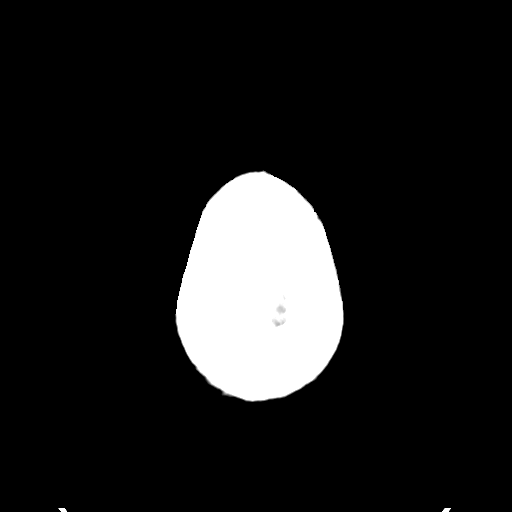
[im 29/32  bone]
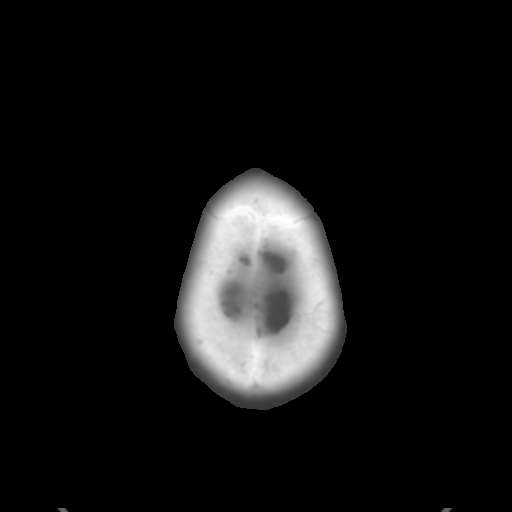

[Series 5: coronal soft tissue · coronal · 0.37mm/px · 3 of 77 slices shown]
[im 26/77  brain]
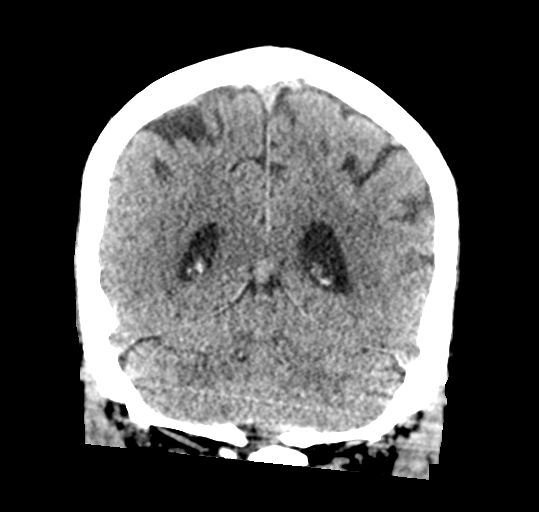
[im 34/77  brain]
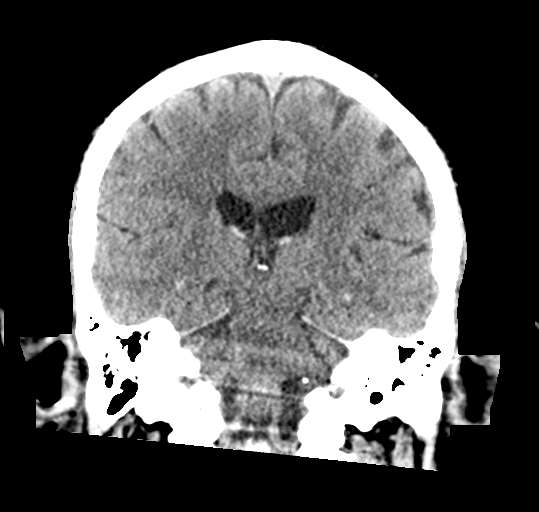
[im 43/77  brain]
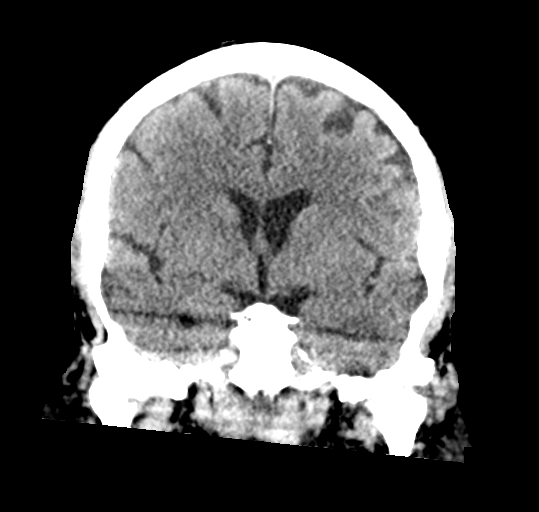

[Series 6: sagittal soft tissue · sagittal · 0.37mm/px · 3 of 58 slices shown]
[im 20/58  brain]
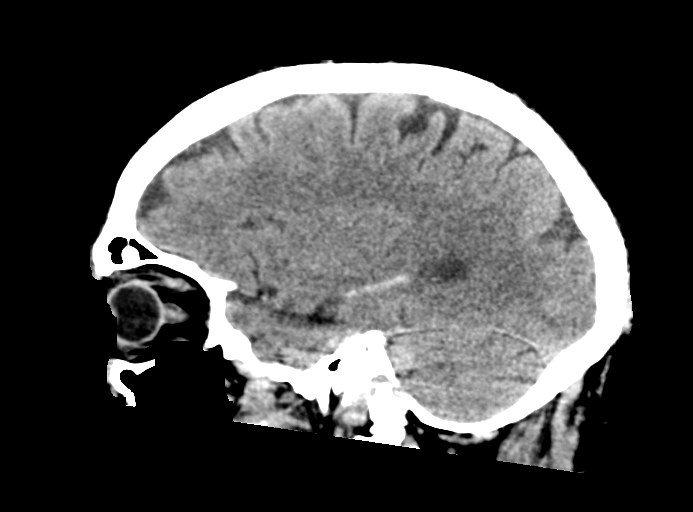
[im 29/58  brain]
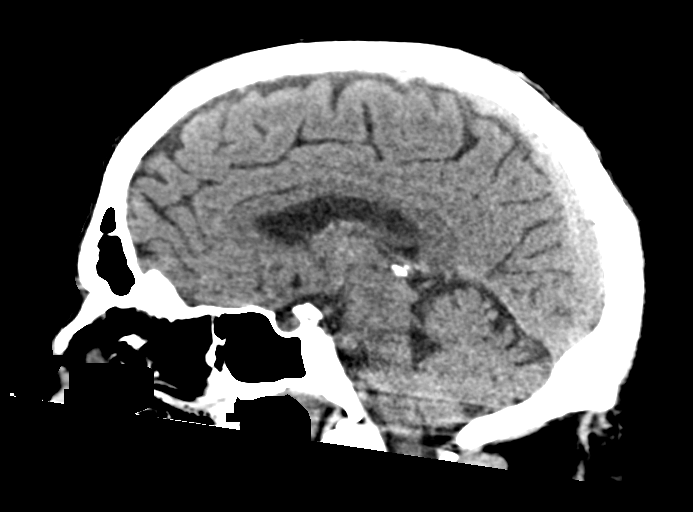
[im 39/58  brain]
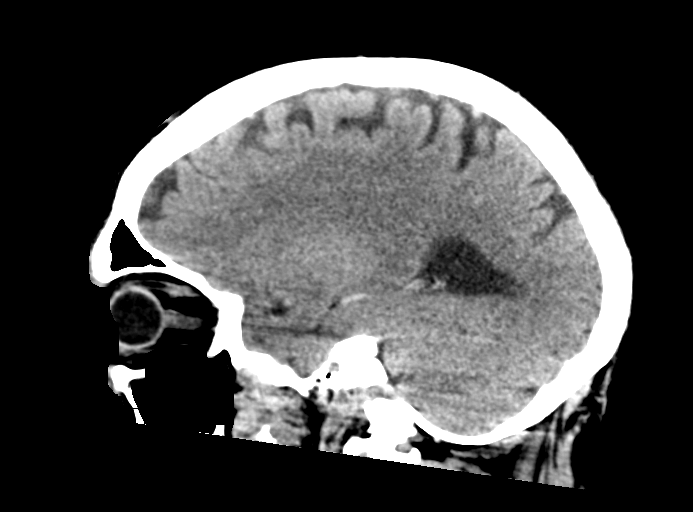

[15 of 47 positions shown; findings below may reference images not displayed]

FINDINGS: Brain: No evidence of acute infarction, hemorrhage, hydrocephalus,
extra-axial collection or mass lesion/mass effect.

Vascular: Negative for hyperdense vessel

Skull: Negative

Sinuses/Orbits: Negative

Other: None

ASPECTS ([place] Stroke Program Early CT Score)

- Ganglionic level infarction (caudate, lentiform nuclei, internal
capsule, insula, M1-M3 cortex): 7

- Supraganglionic infarction (M4-M6 cortex): 3

Total score (0-10 with 10 being normal): 10
IMPRESSION: 1. Negative CT head
2. ASPECTS is 10
Code stroke imaging results were communicated on [DATE] at [DATE] to provider G[facility] via text page

## 2021-08-31 IMAGING — MR MR HEAD W/O CM
11 of 12 series · 38 of 48 positions shown · non-contrast
Comparison: Noncontrast head CT [DATE].

CLINICAL DATA: Neuro deficit, acute, stroke suspected. Additional
history provided: Patient reports dizziness with arm/face numbness.

EXAM:
MRI HEAD WITHOUT CONTRAST
MRA HEAD WITHOUT CONTRAST
MRA NECK WITHOUT CONTRAST
TECHNIQUE: Multiplanar, multi-echo pulse sequences of the brain and surrounding
structures were acquired without intravenous contrast. Angiographic
images of the Circle of Willis were acquired using MRA technique
without intravenous contrast. Angiographic images of the neck were
acquired using MRA technique without intravenous contrast. Carotid
stenosis measurements (when applicable) are obtained utilizing
NASCET criteria, using the distal internal carotid diameter as the
denominator.

[Series 5: ax dwi_tracew · axial · 3.0mm · 0.65mm/px · z∈[-88,+67]mm · 4 of 48 slices shown]
[im 1/48]
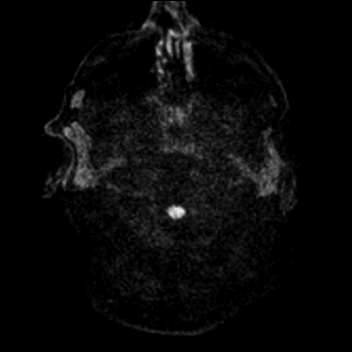
[im 16/48]
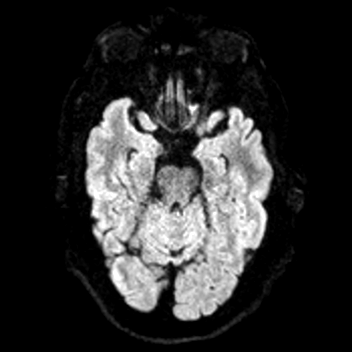
[im 32/48]
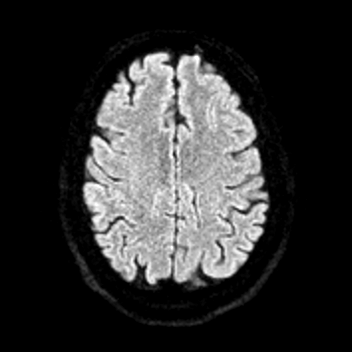
[im 48/48]
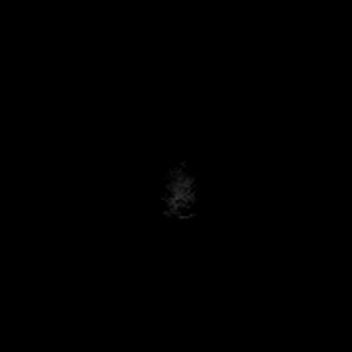

[Series 6: ax dwi_adc · axial · 3.0mm · 0.65mm/px · z∈[-88,+67]mm · 3 of 48 slices shown]
[im 1/48]
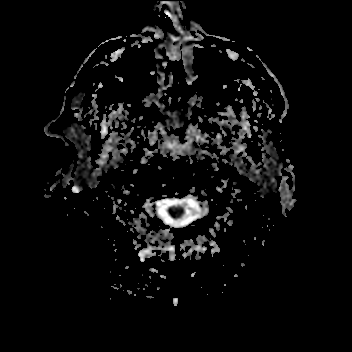
[im 24/48]
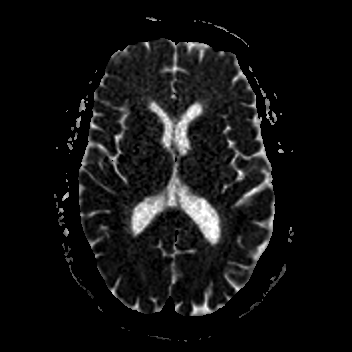
[im 48/48]
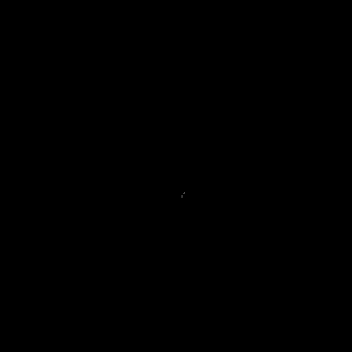

[Series 7: cor dwi_tracew · coronal · 5.0mm · 0.65mm/px · 3 of 40 slices shown]
[im 1/40]
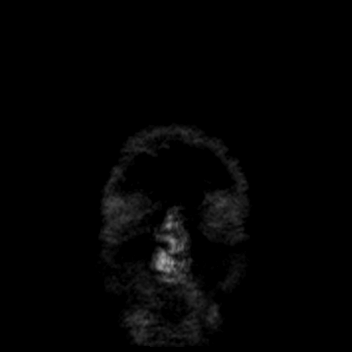
[im 20/40]
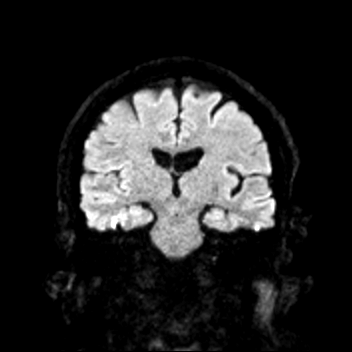
[im 40/40]
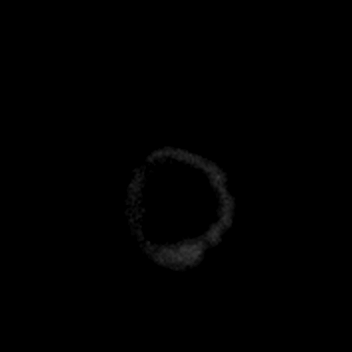

[Series 8: cor dwi_adc · coronal · 5.0mm · 0.65mm/px · 3 of 40 slices shown]
[im 1/40]
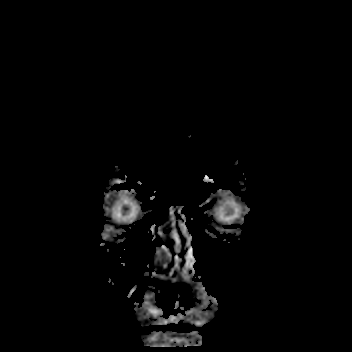
[im 20/40]
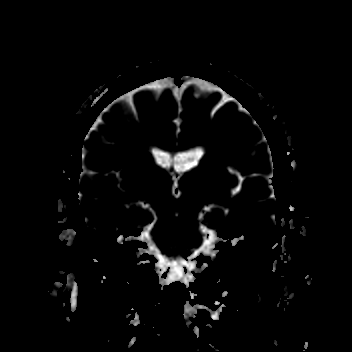
[im 40/40]
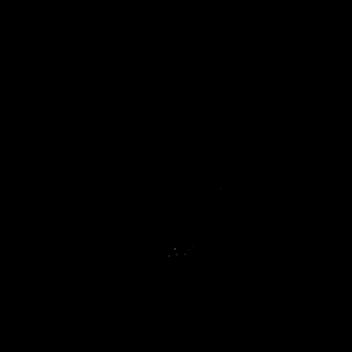

[Series 9: FLAIR · axial · 3.0mm · 0.53mm/px · z∈[-92,+70]mm · 4 of 55 slices shown]
[im 1/55]
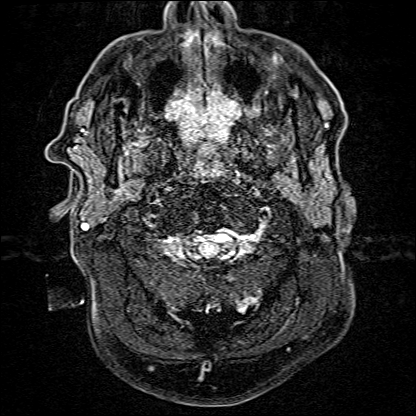
[im 19/55]
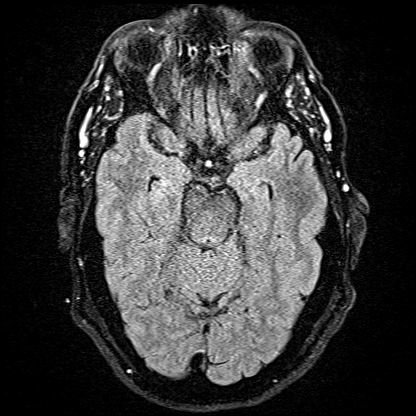
[im 37/55]
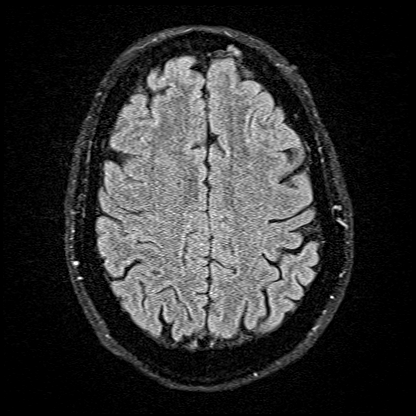
[im 55/55]
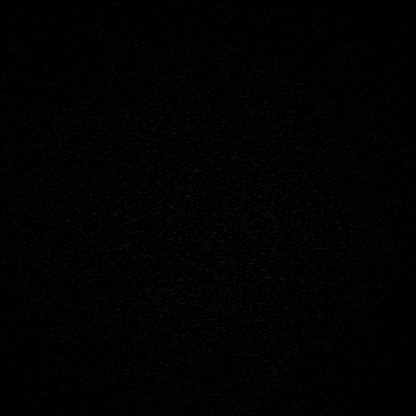

[Series 10: mag_images · axial · 3.0mm · 0.90mm/px · z∈[-100,+77]mm · 4 of 60 slices shown]
[im 1/60]
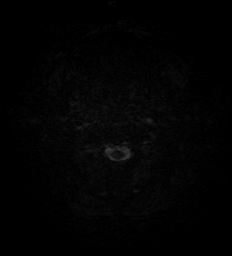
[im 20/60]
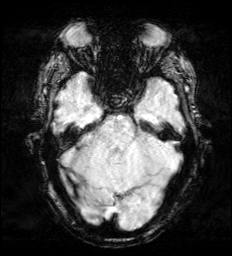
[im 40/60]
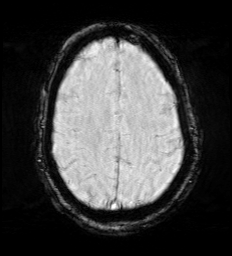
[im 60/60]
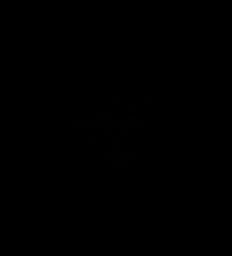

[Series 11: pha_images · axial · 3.0mm · 0.90mm/px · z∈[-97,+11]mm · 3 of 56 slices shown]
[im 1/56]
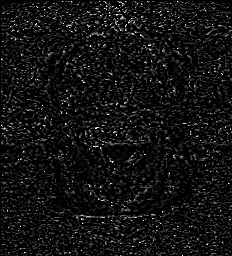
[im 19/56]
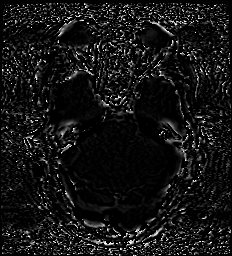
[im 37/56]
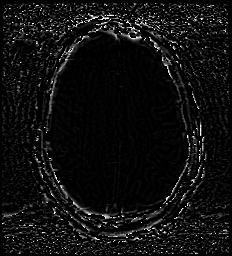

[Series 14: T1 · sagittal · 5.0mm · 0.62mm/px · 2 of 23 slices shown (1 of 2)]
[im 1/23]
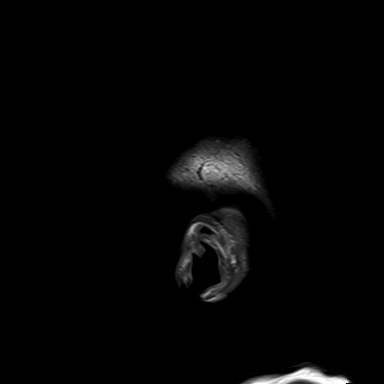
[im 23/23]
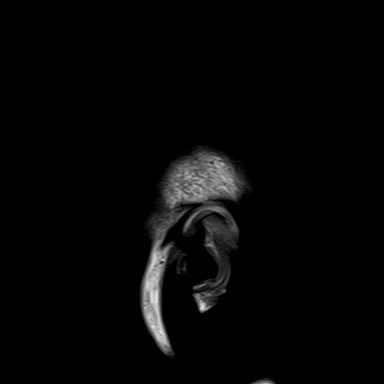

[Series 15: T2 · axial · 5.0mm · 0.53mm/px · z∈[-89,+67]mm · 2 of 27 slices shown (1 of 2)]
[im 1/27]
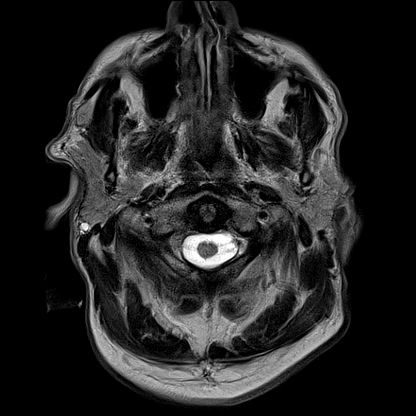
[im 27/27]
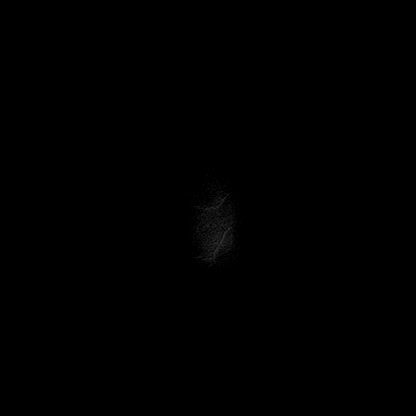

[Series 16: T1 · axial · 1.0mm · 0.98mm/px · z∈[-98,+77]mm · 8 of 176 slices shown (2 of 2)]
[im 1/176]
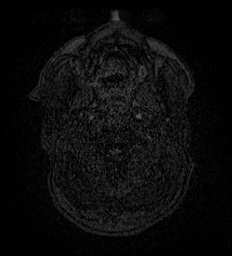
[im 30/176]
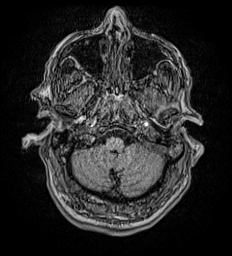
[im 59/176]
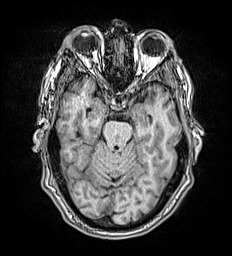
[im 73/176]
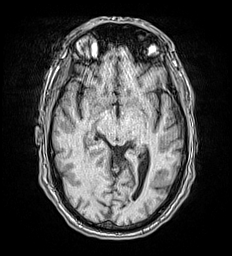
[im 103/176]
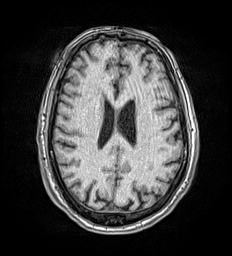
[im 117/176]
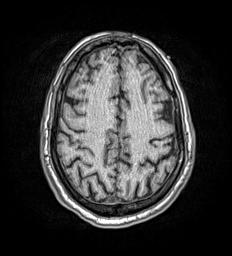
[im 146/176]
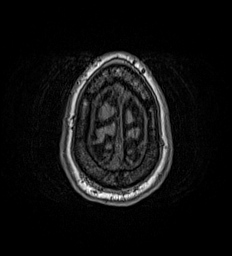
[im 176/176]
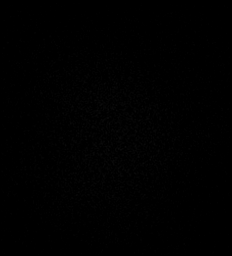

[Series 17: T2 · coronal · 5.0mm · 0.57mm/px · 2 of 29 slices shown (2 of 2)]
[im 1/29]
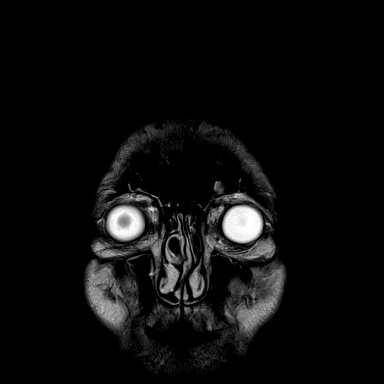
[im 29/29]
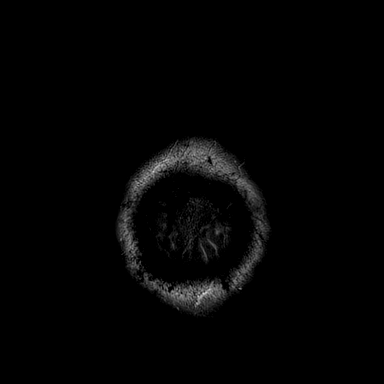

[38 of 48 positions shown; findings below may reference images not displayed]

FINDINGS: MRI HEAD FINDINGS

Brain:

Intermittently motion degraded exam. Most notably, there is moderate
motion degradation of the axial T2 TSE sequence.

Cerebral volume is normal for age.

Minimal multifocal T2 FLAIR hyperintense signal abnormality within
the cerebral white matter, nonspecific but compatible with chronic
small vessel ischemic disease.

There is no acute infarct.

No evidence of an intracranial mass.

No chronic intracranial blood products.

No extra-axial fluid collection.

No midline shift.

Vascular: Maintained flow voids within the proximal large arterial
vessels.

Skull and upper cervical spine: No focal suspicious marrow lesion.
Incompletely assessed cervical spondylosis.

Sinuses/Orbits: Visualized orbits show no acute finding. Mild
scattered mucosal thickening within the left frontal and bilateral
ethmoid sinuses at the imaged levels.

Other: Trace fluid within the bilateral mastoid air cells.

MRA HEAD FINDINGS

Mildly motion degraded exam.

Anterior circulation:

The intracranial internal carotid arteries are patent. The M1 middle
cerebral arteries are patent. No M2 proximal branch occlusion or
high-grade proximal stenosis is identified. The anterior cerebral
arteries are patent.

1-2 mm vascular protrusion arising from the pre cavernous right
internal carotid artery, which which may reflect atherosclerotic
irregularity or a small aneurysm (series [2V], image 14).

2-3 mm broad-based inferiorly projecting aneurysm arising from the
cavernous left internal carotid artery (for instance as seen on
series [2V], image 4).

Posterior circulation:

The intracranial vertebral arteries are patent. The basilar artery
is patent. The posterior cerebral arteries are patent. Severe
stenosis within the proximal right posterior cerebral artery at the
P1/P2 junction. Severe stenosis within left posterior cerebral
artery at the P2/P3 junction. Posterior communicating arteries are
diminutive or absent bilaterally.

Anatomic variants: As described.

MRA NECK FINDINGS

Moderately motion degraded examination.

The proximal common carotid arteries and proximal vertebral arteries
are excluded from the field of view. The distal cervical internal
carotid arteries and cervical vertebral arteries are excluded from
the field of view.

The visualized common carotid, internal carotid and vertebral
arteries are patent within the neck. Within the limitations of
motion degradation, there is no appreciable hemodynamically
significant stenosis (50% or greater) within these vessels at the
imaged levels.
IMPRESSION: MRI brain:

1. Intermittently motion degraded exam, as described.
2. No evidence of acute intracranial abnormality.
3. Minimal chronic small vessel ischemic changes within the cerebral
white matter.
4. Mild left frontal and bilateral ethmoid sinus mucosal thickening.
5. Trace fluid within the bilateral mastoid air cells.

MRA head:

1. Mildly motion degraded exam.
2. Severe stenosis within the proximal right posterior cerebral
artery at the P1/P2 junction.
3. Severe stenosis within the left posterior cerebral artery at the
P2/P3 junction.
4. 2-3 mm broad-based inferiorly projecting aneurysm arising from
the cavernous left internal carotid artery.
5. 1-2 mm vascular protrusion arising from the pre-cavernous right
internal carotid artery, which may reflect atherosclerotic
lobulation or an additional small aneurysm.

MRA neck:

1. Moderately motion degraded exam.
2. Portions of the proximal common carotid and vertebral arteries
are excluded from the field of view. Additionally, portions of the
distal cervical internal carotid and cervical vertebral arteries are
excluded from the field of view.
3. Within the limitations of motion degradation, there is no
appreciable hemodynamically significant stenosis of the common
carotid, cervical internal carotid or cervical vertebral arteries at
the imaged levels.

## 2021-08-31 MED ORDER — SENNOSIDES-DOCUSATE SODIUM 8.6-50 MG PO TABS: 1.0000 | ORAL_TABLET | Freq: Every evening | ORAL | Status: DC | PRN

## 2021-08-31 MED ORDER — CLOPIDOGREL BISULFATE 75 MG PO TABS
300.0000 mg | ORAL_TABLET | Freq: Once | ORAL | Status: AC
  Administered 2021-08-31: 300 mg via ORAL
  Filled 2021-08-31: qty 4

## 2021-08-31 MED ORDER — STROKE: EARLY STAGES OF RECOVERY BOOK: Freq: Once | Status: AC

## 2021-08-31 MED ORDER — ASPIRIN 325 MG PO TABS
325.0000 mg | ORAL_TABLET | Freq: Once | ORAL | Status: AC
  Administered 2021-08-31: 325 mg via ORAL
  Filled 2021-08-31: qty 1

## 2021-08-31 MED ORDER — SODIUM CHLORIDE 0.9% FLUSH
3.0000 mL | Freq: Once | INTRAVENOUS | Status: AC
  Administered 2021-08-31: 3 mL via INTRAVENOUS

## 2021-08-31 MED ORDER — ALBUTEROL SULFATE HFA 108 (90 BASE) MCG/ACT IN AERS
2.0000 | INHALATION_SPRAY | RESPIRATORY_TRACT | Status: DC | PRN
  Filled 2021-08-31: qty 6.7

## 2021-08-31 MED ORDER — HEPARIN SODIUM (PORCINE) 5000 UNIT/ML IJ SOLN
5000.0000 [IU] | Freq: Three times a day (TID) | INTRAMUSCULAR | Status: DC
  Administered 2021-08-31 – 2021-09-01 (×3): 5000 [IU] via SUBCUTANEOUS
  Filled 2021-08-31 (×3): qty 1

## 2021-08-31 MED ORDER — PANTOPRAZOLE SODIUM 40 MG PO TBEC
40.0000 mg | DELAYED_RELEASE_TABLET | Freq: Every day | ORAL | Status: DC
  Administered 2021-08-31: 40 mg via ORAL
  Filled 2021-08-31: qty 1

## 2021-08-31 MED ORDER — CLOPIDOGREL BISULFATE 75 MG PO TABS
75.0000 mg | ORAL_TABLET | Freq: Every day | ORAL | Status: DC
  Administered 2021-09-01: 10:00:00 75 mg via ORAL
  Filled 2021-08-31: qty 1

## 2021-08-31 MED ORDER — MAGNESIUM OXIDE -MG SUPPLEMENT 400 (240 MG) MG PO TABS
1000.0000 mg | ORAL_TABLET | Freq: Every day | ORAL | Status: DC
  Administered 2021-08-31: 23:00:00 1000 mg via ORAL
  Filled 2021-08-31: qty 3

## 2021-08-31 MED ORDER — ONDANSETRON HCL 4 MG/2ML IJ SOLN: 4.0000 mg | Freq: Three times a day (TID) | INTRAMUSCULAR | Status: DC | PRN

## 2021-08-31 MED ORDER — ACETAMINOPHEN 650 MG RE SUPP: 650.0000 mg | RECTAL | Status: DC | PRN

## 2021-08-31 MED ORDER — SODIUM ZIRCONIUM CYCLOSILICATE 10 G PO PACK
10.0000 g | PACK | Freq: Once | ORAL | Status: AC
  Administered 2021-08-31: 10 g via ORAL
  Filled 2021-08-31: qty 1

## 2021-08-31 MED ORDER — TAMSULOSIN HCL 0.4 MG PO CAPS
0.8000 mg | ORAL_CAPSULE | Freq: Every day | ORAL | Status: DC
  Administered 2021-08-31: 19:00:00 0.8 mg via ORAL
  Filled 2021-08-31: qty 2

## 2021-08-31 MED ORDER — ASPIRIN 300 MG RE SUPP
300.0000 mg | Freq: Once | RECTAL | Status: AC
  Filled 2021-08-31: qty 1

## 2021-08-31 MED ORDER — NICOTINE 21 MG/24HR TD PT24
21.0000 mg | MEDICATED_PATCH | Freq: Every day | TRANSDERMAL | Status: DC
  Administered 2021-08-31 – 2021-09-01 (×2): 21 mg via TRANSDERMAL
  Filled 2021-08-31 (×2): qty 1

## 2021-08-31 MED ORDER — ATORVASTATIN CALCIUM 20 MG PO TABS
40.0000 mg | ORAL_TABLET | Freq: Every day | ORAL | Status: DC
  Administered 2021-08-31: 40 mg via ORAL
  Filled 2021-08-31: qty 2

## 2021-08-31 MED ORDER — VITAMIN D 25 MCG (1000 UNIT) PO TABS
2000.0000 [IU] | ORAL_TABLET | Freq: Every day | ORAL | Status: DC
  Administered 2021-08-31: 23:00:00 2000 [IU] via ORAL
  Filled 2021-08-31: qty 2

## 2021-08-31 MED ORDER — ASPIRIN EC 81 MG PO TBEC
81.0000 mg | DELAYED_RELEASE_TABLET | Freq: Every day | ORAL | Status: DC
  Administered 2021-09-01: 10:00:00 81 mg via ORAL
  Filled 2021-08-31: qty 1

## 2021-08-31 MED ORDER — HYDRALAZINE HCL 20 MG/ML IJ SOLN: 5.0000 mg | INTRAMUSCULAR | Status: DC | PRN

## 2021-08-31 MED ORDER — DM-GUAIFENESIN ER 30-600 MG PO TB12: 1.0000 | ORAL_TABLET | Freq: Two times a day (BID) | ORAL | Status: DC | PRN

## 2021-08-31 MED ORDER — LEVOTHYROXINE SODIUM 25 MCG PO TABS
125.0000 ug | ORAL_TABLET | Freq: Every day | ORAL | Status: DC
  Administered 2021-09-01: 07:00:00 125 ug via ORAL
  Filled 2021-08-31: qty 1

## 2021-08-31 MED ORDER — ACETAMINOPHEN 160 MG/5ML PO SOLN
650.0000 mg | ORAL | Status: DC | PRN
  Filled 2021-08-31: qty 20.3

## 2021-08-31 MED ORDER — ACETAMINOPHEN 325 MG PO TABS: 650.0000 mg | ORAL_TABLET | ORAL | Status: DC | PRN

## 2021-08-31 MED ORDER — SODIUM CHLORIDE 0.9 % IV SOLN: INTRAVENOUS | Status: DC

## 2021-08-31 NOTE — ED Notes (Signed)
Activated code stroke to [place]

## 2021-08-31 NOTE — Progress Notes (Signed)
*  PRELIMINARY RESULTS* Echocardiogram 2D Echocardiogram has been performed.  Sherrie Sport 08/31/2021, 1:58 PM

## 2021-08-31 NOTE — Code Documentation (Signed)
Stroke Response Nurse Documentation Code Documentation  Tyrone Moore is a 70 y.o. male arriving to [facility] ED via [place] on 08/31/2021. Code stroke was activated by ED triage RN.   Patient from home where he was LKW at  8 PM on 10/20, symptom discovery at 10 PM on 10/20 and now complaining of increased dizziness and left sided facial and arm numbness.   Stroke team at the bedside on patient arrival. Labs drawn and patient cleared for CT by Dr. Kerman Passey. Patient to CT with team. NIHSS 4, see documentation for details and code stroke times.  CT Head, MRA head and neck were performed. Pt known to have CKD so no IV contrast to be given per Dr Curly Shores. Patient is not a candidate for IV Thrombolytic due to LKW> 4.5hrs.   Care/Plan: MRA to be completed.   Bedside handoff with ED RN Karma Ganja.    Velta Addison Stroke Response RN

## 2021-08-31 NOTE — Consult Note (Signed)
Neurology Consultation Reason for Consult: Code stroke Requesting Physician: Harvest Dark  CC: Dizziness   History is obtained from: Patient, chart review and ED staff  HPI: Tyrone Moore is a 70 y.o. male with a past medical history significant for hypertension, hyperlipidemia, COPD, hypothyroidism, ongoing tobacco abuse (1 pack/day), CKD stage IV.  He reports he is in his usual state of health when he went to bed at 8 PM last night.  He woke up at 10 PM and went to use bathroom and felt very dizzy.  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  His son additionally initially attributed this to the patient being perhaps slightly hypoglycemic, but then the patient was complaining of left-sided face and arm numbness and he brought him to the ED for evaluation for stroke, he does feel weak on the left side.  Patient denies any headache, vision issues, nausea, vomiting or other recent focal neurological deficits.  He endorses a chronic cough related to his smoking but feels this is no worse than normal.  He reports no blood in his urine or stool or bleeding concerns at this time (did have a cystoscopy in August).  He does not note any other recent infectious or systemic symptoms on full review of systems.  He is precontemplative about tobacco use cessation, noting that its the only thing that helps his nerves.  LKW: 8 PM on 10/20, symptom discovery at 10 PM on 10/20 tPA given?: No, out of the window Premorbid modified rankin scale: 0-1     0 - No symptoms.     1 - No significant disability. Able to carry out all usual activities, despite some symptoms.  ROS: All other review of systems was negative except as noted in the HPI.    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    Chronic bilateral low back pain without sciatica 03/26/2021   S/p 4 lumbar spine surgeries   Chronic obstructive pulmonary disease, unspecified ([place]) 07/11/2021   CKD (chronic kidney disease) stage 4, GFR 15-29 ml/min ([place]) 03/27/2021   Essential hypertension 03/26/2021   GERD (gastroesophageal reflux disease)    Hyperlipidemia    Hypertension    Mixed hyperlipidemia 03/26/2021   Thyroid disease    Past Surgical History:  Procedure Laterality Date   CYSTOSCOPY W/ RETROGRADES Bilateral 07/03/2021   Procedure: CYSTOSCOPY WITH RETROGRADE PYELOGRAM;  Surgeon: Hollice Espy, MD;  Location: [facility] ORS;  Service: Urology;  Laterality: Bilateral;   CYSTOSCOPY WITH LITHOLAPAXY N/A 07/03/2021   Procedure: CYSTOSCOPY WITH LITHOLAPAXY;  Surgeon: Hollice Espy, MD;  Location: [facility] ORS;  Service: Urology;  Laterality: N/A;   HOLEP-LASER ENUCLEATION OF THE PROSTATE WITH MORCELLATION N/A 07/03/2021   Procedure: HOLEP-LASER ENUCLEATION OF THE PROSTATE WITH MORCELLATION;  Surgeon: Hollice Espy, MD;  Location: [facility] ORS;  Service: Urology;  Laterality: N/A;   SPINE SURGERY  2015   X 4 surgery - lumbar disc     Family History  Problem Relation Age of Onset   Hypertension Mother    Heart disease Mother    Heart disease Sister    Hypertension Sister    Prostate cancer Neg Hx    Bladder Cancer Neg Hx    Kidney cancer Neg Hx    Social History:  reports that  he has been smoking cigarettes. He has a 54.00 pack-year smoking history. He has never used smokeless tobacco. He reports that he does not currently use alcohol. He reports that he does not use drugs.   Exam: Current vital signs: BP (!) 168/78 (BP Location: Left Arm)   Pulse 63   Temp 97.7 F (36.5 C) (Oral)   Resp 18   Ht 5\' 11"  (1.803 m)   Wt 95.3 kg   SpO2 97%   BMI 29.29 kg/m  Vital signs in last 24 hours: Temp:  [97.7 F (36.5 C)] 97.7 F (36.5 C) (10/21 0939) Pulse Rate:  [63] 63 (10/21 0939) Resp:  [18] 18 (10/21 0939) BP:  (168)/(78) 168/78 (10/21 0939) SpO2:  [97 %] 97 % (10/21 0939) Weight:  [95.3 kg] 95.3 kg (10/21 0940)   Physical Exam  Constitutional: Appears well-developed and well-nourished.  Psych: Affect appropriate to situation, calm and cooperative Eyes: No scleral injection HENT: No oropharyngeal obstruction.  MSK: no joint deformities.  Cardiovascular: Normal rate and regular rhythm.  Respiratory: Effort normal, non-labored breathing GI: Soft.  No distension. There is no tenderness.  Skin: Warm dry and intact visible skin  Neuro: Mental Status: Patient is awake, alert, oriented to person, place, month, age and situation. Patient is able to give a clear and coherent history. No signs of aphasia or neglect Cranial Nerves: II: Visual Fields are full. Pupils are equal, round, and reactive to light.   III,IV, VI: EOMI without ptosis or diploplia. Direction changing nystagmus on lateral gaze V: Facial sensation is reduced to light touch on the left face  VII: Facial movement is notable for a mild left facial droop VIII: hearing is intact to voice X: Uvula elevates symmetrically XI: Shoulder shrug is symmetric. XII: tongue is midline without atrophy or fasciculations.  Motor: Tone is normal. Bulk is normal.  No drift of the bilateral upper or lower extremities Sensory: Sensation is reduced to light touch in the left face and arm Deep Tendon Reflexes: 2+ and symmetric in the biceps and patellae.  Cerebellar: Bilateral ataxia in the UE, left markedly worse than right. Heel to shin intact bilaterally   NIHSS total 4   I have reviewed labs in epic and the results pertinent to this consultation are: I-stat Cr > 4    Basic Metabolic Panel: Recent Labs  Lab 08/31/21 0955 08/31/21 0957  NA 141  --   K 5.4*  --   CL 112*  --   CO2 24  --   GLUCOSE 85  --   BUN 43*  --   CREATININE 3.99* 4.20*  CALCIUM 8.9  --     CBC: Recent Labs  Lab 08/31/21 0955  WBC 6.4  NEUTROABS 3.6   HGB 12.2*  HCT 36.1*  MCV 100.0  PLT 173    Coagulation Studies: Recent Labs    08/31/21 0955  LABPROT 13.3  INR 1.0      I have reviewed the images obtained: Head CT negative for acute intracranial process  MRA head negative for acute LVO, agree with radiology: 1. Mildly motion degraded exam. 2. 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 3. Severe stenosis within the left posterior cerebral artery at the P2/P3 junction. 4. 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 5. 1-2 mm vascular protrusion arising from the pre-cavernous right internal carotid artery, which may reflect atherosclerotic lobulation or an additional small aneurysm.  MRI brain negative for acute stroke, personally reviewed and agree with radiology: 1. Intermittently motion degraded exam, as described. 2. No evidence of acute intracranial abnormality. 3. Minimal chronic small vessel ischemic changes within the cerebral white matter. 4. Mild left frontal and bilateral ethmoid sinus mucosal thickening. 5. Trace fluid within the bilateral mastoid air cells.   MRA neck reviewed, agree with radiology: 1. Moderately motion degraded exam. 2. Portions of the proximal common carotid and vertebral arteries are excluded from the field of view. Additionally, portions of the distal cervical internal carotid and cervical vertebral arteries are excluded from the field of view. 3. Within the limitations of motion degradation, there is no appreciable hemodynamically significant stenosis of the common carotid, cervical internal carotid or cervical vertebral arteries at the imaged levels.   Impression: This is a 70 year old gentleman presenting with focal neurological deficits concerning for posterior circulation stroke (direction changing nystagmus, left-sided numbness and ataxia of the left upper extremity  most notably).  This could certainly localize to the brainstem and in the first 24-hour period since symptom onset, HINTS examination is more sensitive than MRI for acute stroke.  Therefore I favor this is an acute stroke, with incidental COVID-19 positive finding.  Code stroke recs:  - MRA head and neck instead of CTA to clear posterior circulation given renal dysfunction, posterior circ symptoms  Additional Recommendations: # Clinically determined brainstem stroke - Stroke labs HgbA1c, fasting lipid panel - Frequent neuro checks - Echocardiogram - Prophylactic therapy-Antiplatelet med: Aspirin - dose 325mg  PO or 300mg  PR, followed by 81 mg daily - Plavix 300 mg load with 75 mg daily for 21 day course  - Risk factor modification - Telemetry monitoring - Blood pressure goal   - Permissive hypertension to 220/120 due to acute stroke for 24-48 hrs - PT consult, OT consult, Speech consult, unless patient is back to baseline - Neurology to follow - Appreciate management of comorbidities including COVID-19 per primary team  Lesleigh Noe MD-PhD [facility] [phone] [facility] coverage for [facility] is from 8 AM to 4 AM in-house and 4 PM to 8 PM by telephone/video. 8 PM to 8 AM emergent questions or overnight urgent questions should be addressed to Teleneurology On-call or [facility] neurohospitalist; contact information can be found on AMION  Total critical care time: 70 minutes   Critical care time was exclusive of separately billable procedures and treating other patients.   Critical care was necessary to treat or prevent imminent or life-threatening deterioration.   Critical care was time spent personally by me on the following activities: development of treatment plan with patient and/or surrogate as well as nursing, discussions with consultants/primary team, evaluation of patient's response to treatment, examination of patient, obtaining history from patient or  surrogate, ordering and performing treatments and interventions, ordering and review of laboratory studies, ordering and review of radiographic studies, and re-evaluation of patient's condition as needed, as documented above.

## 2021-08-31 NOTE — ED Provider Notes (Signed)
[facility] Emergency Department Provider Note  Time seen: 10:03 AM  I have reviewed the triage vital signs and the nursing notes.   HISTORY  Chief Complaint Extremity Weakness   HPI Tyrone Moore is a 70 y.o. male with a past medical history of CKD, COPD, gastric reflux, hypertension, hyperlipidemia, presents to the emergency department for left-sided weakness.  According to the son around 9:10 AM this morning patient began experiencing left-sided weakness while at a parking lot of a restaurant.  States some diminished sensation in the left face and left arm and somewhat weak in the left lower extremity as well.  Patient was made a code stroke upon arrival, when I evaluated the patient neurology at bedside seeing the patient as well.  They state after further details have emerged, symptoms started first around 10 PM last night.  Patient denies any headache or confusion.  Continues to state weakness mostly in the left upper extremity.   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   Chronic bilateral low back pain without sciatica 03/26/2021   S/p 4 lumbar spine surgeries   Chronic obstructive pulmonary disease, unspecified ([place]) 07/11/2021   CKD (chronic kidney disease) stage 4, GFR 15-29 ml/min ([place]) 03/27/2021   Essential hypertension 03/26/2021   GERD (gastroesophageal reflux disease)    Hyperlipidemia    Hypertension    Mixed hyperlipidemia 03/26/2021   Thyroid disease     Patient Active Problem List   Diagnosis Date Noted   Chronic obstructive pulmonary disease, unspecified ([place]) 07/11/2021   Proteinuria, unspecified 05/16/2021   CKD (chronic kidney disease) stage 4, GFR 15-29 ml/min ([place]) 03/27/2021   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   Mixed hyperlipidemia 03/26/2021   Acquired hypothyroidism 03/26/2021   Essential hypertension  03/26/2021   Tobacco use disorder 03/26/2021   Chronic bilateral low back pain without sciatica 03/26/2021    Past Surgical History:  Procedure Laterality Date   CYSTOSCOPY W/ RETROGRADES Bilateral 07/03/2021   Procedure: CYSTOSCOPY WITH RETROGRADE PYELOGRAM;  Surgeon: Hollice Espy, MD;  Location: [facility] ORS;  Service: Urology;  Laterality: Bilateral;   CYSTOSCOPY WITH LITHOLAPAXY N/A 07/03/2021   Procedure: CYSTOSCOPY WITH LITHOLAPAXY;  Surgeon: Hollice Espy, MD;  Location: [facility] ORS;  Service: Urology;  Laterality: N/A;   HOLEP-LASER ENUCLEATION OF THE PROSTATE WITH MORCELLATION N/A 07/03/2021   Procedure: HOLEP-LASER ENUCLEATION OF THE PROSTATE WITH MORCELLATION;  Surgeon: Hollice Espy, MD;  Location: [facility] ORS;  Service: Urology;  Laterality: N/A;   SPINE SURGERY  2015   X 4 surgery - lumbar disc    Prior to Admission medications   Medication Sig Start Date End Date Taking? Authorizing Provider  acetaminophen (TYLENOL) 500 MG tablet Take 1,000 mg by mouth every 6 (six) hours as needed for moderate pain.    [provider]  atorvastatin (LIPITOR) 40 MG tablet Take 40 mg by mouth daily. Patient not taking: Reported on 07/18/2021    [provider]  carvedilol (COREG) 25 MG tablet Take 25 mg by mouth 2 (two) times daily with a meal.    [provider]  Cholecalciferol (D3-1000) 25 MCG (1000 UT) capsule Take 2,000 Units by mouth daily.    [provider]  levothyroxine (EUTHYROX) 112 MCG tablet Take 1 tablet (112 mcg total) by mouth daily before breakfast. 03/28/21   Glean Hess, MD  lisinopril (ZESTRIL) 10 MG tablet Take 10 mg by mouth  daily.    [provider]  MAGNESIUM PO Take 1,000 mg by mouth daily.    [provider]  Multiple Vitamins-Minerals (MULTIVITAL-M PO) Take 1 tablet by mouth daily.    [provider]  omeprazole (PRILOSEC) 20 MG capsule Take 20 mg by mouth daily.    [provider]   tamsulosin (FLOMAX) 0.4 MG CAPS capsule Take 0.8 mg by mouth daily after supper.    [provider]    No Known Allergies  Family History  Problem Relation Age of Onset   Hypertension Mother    Heart disease Mother    Heart disease Sister    Hypertension Sister    Prostate cancer Neg Hx    Bladder Cancer Neg Hx    Kidney cancer Neg Hx     Social History Social History   Tobacco Use   Smoking status: Heavy Smoker    Packs/day: 1.00    Years: 54.00    Pack years: 54.00    Types: Cigarettes   Smokeless tobacco: Never  Vaping Use   Vaping Use: Never used  Substance Use Topics   Alcohol use: Not Currently    Comment: quit drinking at 19   Drug use: Never    Review of Systems Constitutional: Negative for fever. Cardiovascular: Negative for chest pain. Respiratory: Negative for shortness of breath. Gastrointestinal: Negative for abdominal pain, vomiting  Musculoskeletal: Negative for musculoskeletal complaints Neurological: Negative for headache All other ROS negative  ____________________________________________   PHYSICAL EXAM:  VITAL SIGNS: ED Triage Vitals  Enc Vitals Group     BP 08/31/21 0939 (!) 168/78     Pulse Rate 08/31/21 0939 63     Resp 08/31/21 0939 18     Temp 08/31/21 0939 97.7 F (36.5 C)     Temp Source 08/31/21 0939 Oral     SpO2 08/31/21 0939 97 %     Weight 08/31/21 0940 210 lb (95.3 kg)     Height 08/31/21 0940 5\' 11"  (1.803 m)     Head Circumference --      Peak Flow --      Pain Score --      Pain Loc --      Pain Edu? --      Excl. in [place]? --    Constitutional: Alert and oriented. Well appearing and in no distress. Eyes: Normal exam ENT      Head: Normocephalic and atraumatic.      Mouth/Throat: Mucous membranes are moist. Cardiovascular: Normal rate, regular rhythm.  Respiratory: Normal respiratory effort without tachypnea nor retractions. Breath sounds are clear Gastrointestinal: Soft and nontender. No distention.    Musculoskeletal: Nontender with normal range of motion in all extremities.  Neurologic:  Normal speech and language.  Mildly diminished grip strength in the left upper extremity, no obvious pronator drift.  Mild ataxia in left upper extremity.  Subjective sensory deficits left upper extremity and left face. Skin:  Skin is warm, dry and intact.  Psychiatric: Mood and affect are normal.   ____________________________________________    EKG  EKG viewed and interpreted by myself shows a normal sinus rhythm at 67 bpm with a narrow QRS, normal axis, normal intervals, no concerning ST changes.  ____________________________________________    RADIOLOGY  CT scan of the head is negative.  ____________________________________________   INITIAL IMPRESSION / ASSESSMENT AND PLAN / ED COURSE  Pertinent labs & imaging results that were available during my care of the patient were reviewed by me  and considered in my medical decision making (see chart for details).   Patient presents to the emergency department for left-sided symptoms initially we believe that began around 9:00 this morning however after further details of emergent symptoms first began around 10 PM last night making the patient outside of any thrombolysis window.  On examination patient does have decreased sensation to the left face and upper extremity with decreased grip strength left upper extremity and mild ataxia.  Symptoms concerning for possible CVA.  CT scan of the head is negative.  Neurology is at the bedside seeing the patient.  Due to his significant renal dysfunction they do not believe a CTA would be in the patient's best interest and would like the patient to go immediately to MRI/MRA to rule out LVO and to evaluate for CVA.  I have seen and evaluated the patient and believe he is safe for MRI.  Lab work is pending.   Labs show renal insufficiency which appears to be chronic.  No significant findings on MRI.  Neurology  has seen and evaluated would like the patient admitted to the hospital service for further work-up and treatment.    NIH Stroke Scale   Interval: Baseline Time: 10:06 AM Person Administering Scale: Harvest Dark  Administer stroke scale items in the order listed. Record performance in each category after each subscale exam. Do not go back and change scores. Follow directions provided for each exam technique. Scores should reflect what the patient does, not what the clinician thinks the patient can do. The clinician should record answers while administering the exam and work quickly. Except where indicated, the patient should not be coached (i.e., repeated requests to patient to make a special effort).   1a  Level of consciousness: 0=alert; keenly responsive  1b. LOC questions:  0=Performs both tasks correctly  1c. LOC commands: 0=Performs both tasks correctly  2.  Best Gaze: 0=normal  3.  Visual: 0=No visual loss  4. Facial Palsy: 0=Normal symmetric movement  5a.  Motor left arm: 0=No drift, limb holds 90 (or 45) degrees for full 10 seconds  5b.  Motor right arm: 0=No drift, limb holds 90 (or 45) degrees for full 10 seconds  6a. motor left leg: 0=No drift, limb holds 90 (or 45) degrees for full 10 seconds  6b  Motor right leg:  0=No drift, limb holds 90 (or 45) degrees for full 10 seconds  7. Limb Ataxia: 1=Present in one limb  8.  Sensory: 1=Mild to moderate sensory loss; patient feels pinprick is less sharp or is dull on the affected side; there is a loss of superficial pain with pinprick but patient is aware He is being touched  9. Best Language:  0=No aphasia, normal  10. Dysarthria: 0=Normal  11. Extinction and Inattention: 0=No abnormality  12. Distal motor function: 0=Normal   Total:   2    Tyrone Moore was evaluated in Emergency Department on 08/31/2021 for the symptoms described in the history of present illness. He was evaluated in the context of the global COVID-19  pandemic, which necessitated consideration that the patient might be at risk for infection with the SARS-CoV-2 virus that causes COVID-19. Institutional protocols and algorithms that pertain to the evaluation of patients at risk for COVID-19 are in a state of rapid change based on information released by regulatory bodies including the CDC and federal and state organizations. These policies and algorithms were followed during the patient's care in the ED.  ____________________________________________  FINAL CLINICAL IMPRESSION(S) / ED DIAGNOSES  cva   Harvest Dark, MD 08/31/21 1125

## 2021-08-31 NOTE — Progress Notes (Signed)
OT Cancellation Note  Patient Details Name: Tyrone Moore MRN: [id number] DOB: 13-Feb-1951   Cancelled Treatment:    Reason Eval/Treat Not Completed: Medical issues which prohibited therapy. Per chart review, pt noted to have high K+, 5.4. Per therapy protocols, contraindicated for exertional activity. Pt pending formal neurology consult. Will initiate services at later date/time, as appropriate.   Nino Glow, OTS   Nino Glow 08/31/2021, 1:13 PM

## 2021-08-31 NOTE — Progress Notes (Signed)
   08/31/21 0940  Clinical Encounter Type  Visited With Family  Visit Type Initial;Spiritual support;Social support;Code  Referral From Nurse  Consult/Referral To [facility] ([facility])  Does Patient Have a Medical Advance Directive? No  Would patient like information on creating a medical advance directive? No - Patient declined  [place]  Does Patient Have a Mental Health Advance Directive? No  Would patient like information on creating a mental health advance directive? No - Patient declined   Chaplain responded to code stroke page. When chaplain arrived the PT was already taking to CT-lab for observation. Chaplain met with family member who was at bedside and offered emotional support. Chaplain is available if further needed.  Andee Poles, M. Div.

## 2021-08-31 NOTE — ED Notes (Signed)
Informed RN bed assigned 

## 2021-08-31 NOTE — ED Notes (Signed)
Pt in ct, neurologist and code stroke rn with pt in ct

## 2021-08-31 NOTE — H&P (Signed)
History and Physical    BOYD BUFFALO MOQ:[id number] DOB: Apr 02, 1951 DOA: 08/31/2021  Referring MD/NP/PA:   PCP: Glean Hess, MD   Patient coming from:  The patient is coming from home.  At baseline, pt is independent for most of ADL.        Chief Complaint: left sided numbness and weakness  HPI: Tyrone Moore is a 70 y.o. male with medical history significant of CKD-IV, hypertension, hyperlipidemia, COPD, GERD, hypothyroidism, BPH, chronic back pain, tobacco abuse, who presents with left-sided numbness and weakness.  Patient states that his symptoms started at about 10 PM last night.  He initially noted left arm numbness and left facial numbness, then he noted left leg weakness.  He also had mild slurred speech per his son at the bedside. He has dizziness.  No vision loss or hearing loss.  Patient said that he has mild intermittent dry cough and mild shortness breath due to COPD, which has not changed.  No chest pain.  No fever or chills.  No nausea vomiting, diarrhea or abdominal pain.  No symptoms of UTI  ED Course: pt was found to have positive COVID PCR, worsening renal function, potassium of 5.4, WBC 6.4, INR 1.0, temperature normal, blood pressure 179/93, heart rate 63, RR 18, oxygen saturation 99% on room air.  Pending chest x-ray.  CT of head is negative for acute intracranial abnormality.  MRI for brain has motion degradation, but is negative for acute infarction.  Patient is placed on MedSurg bed for observation  MRI brain: 1. Intermittently motion degraded exam, as described. 2. No evidence of acute intracranial abnormality. 3. Minimal chronic small vessel ischemic changes within the cerebral white matter. 4. Mild left frontal and bilateral ethmoid sinus mucosal thickening. 5. Trace fluid within the bilateral mastoid air cells.   MRA head:  1. Mildly motion degraded exam. 2. 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 3.  Severe stenosis within the left posterior cerebral artery at the P2/P3 junction. 4. 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 5. 1-2 mm vascular protrusion arising from the pre-cavernous right internal carotid artery, which may reflect atherosclerotic lobulation or an additional small aneurysm.   MRA neck:  1. Moderately motion degraded exam. 2. Portions of the proximal common carotid and vertebral arteries are excluded from the field of view. Additionally, portions of the distal cervical internal carotid and cervical vertebral arteries are excluded from the field of view. 3. Within the limitations of motion degradation, there is no appreciable hemodynamically significant stenosis of the common carotid, cervical internal carotid or cervical vertebral arteries at the imaged levels.   Review of Systems:   General: no fevers, chills, no body weight gain, has fatigue HEENT: no blurry vision, hearing changes or sore throat Respiratory: has dyspnea, coughing, no wheezing CV: no chest pain, no palpitations GI: no nausea, vomiting, abdominal pain, diarrhea, constipation GU: no dysuria, burning on urination, increased urinary frequency, hematuria  Ext: no leg edema Neuro: has left-sided numbness and weakness.  Has slurred speech.  No vision change. Skin: no rash, no skin tear. MSK: No muscle spasm, no deformity, no limitation of range of movement in spin Heme: No easy bruising.  Travel history: No recent long distant travel.  Allergy: No Known Allergies  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   Chronic bilateral  low back pain without sciatica 03/26/2021   S/p 4 lumbar spine surgeries   Chronic obstructive pulmonary disease, unspecified ([place]) 07/11/2021   CKD (chronic kidney disease) stage 4, GFR 15-29 ml/min ([place])  03/27/2021   Essential hypertension 03/26/2021   GERD (gastroesophageal reflux disease)    Hyperlipidemia    Hypertension    Mixed hyperlipidemia 03/26/2021   Thyroid disease     Past Surgical History:  Procedure Laterality Date   CYSTOSCOPY W/ RETROGRADES Bilateral 07/03/2021   Procedure: CYSTOSCOPY WITH RETROGRADE PYELOGRAM;  Surgeon: Hollice Espy, MD;  Location: [facility];  Service: Urology;  Laterality: Bilateral;   CYSTOSCOPY WITH LITHOLAPAXY N/A 07/03/2021   Procedure: CYSTOSCOPY WITH LITHOLAPAXY;  Surgeon: Hollice Espy, MD;  Location: [facility];  Service: Urology;  Laterality: N/A;   HOLEP-LASER ENUCLEATION OF THE PROSTATE WITH MORCELLATION N/A 07/03/2021   Procedure: HOLEP-LASER ENUCLEATION OF THE PROSTATE WITH MORCELLATION;  Surgeon: Hollice Espy, MD;  Location: [facility];  Service: Urology;  Laterality: N/A;   SPINE SURGERY  2015   X 4 surgery - lumbar disc    Social History:  reports that he has been smoking cigarettes. He has a 54.00 pack-year smoking history. He has never used smokeless tobacco. He reports that he does not currently use alcohol. He reports that he does not use drugs.  Family History:  Family History  Problem Relation Age of Onset   Hypertension Mother    Heart disease Mother    Heart disease Sister    Hypertension Sister    Prostate cancer Neg Hx    Bladder Cancer Neg Hx    Kidney cancer Neg Hx      Prior to Admission medications   Medication Sig Start Date End Date Taking? Authorizing Provider  acetaminophen (TYLENOL) 500 MG tablet Take 1,000 mg by mouth every 6 (six) hours as needed for moderate pain.    [provider]  atorvastatin (LIPITOR) 40 MG tablet Take 40 mg by mouth daily. Patient not taking: Reported on 07/18/2021    [provider]  carvedilol (COREG) 25 MG tablet Take 25 mg by mouth 2 (two) times daily with a meal.    [provider]  Cholecalciferol (D3-1000) 25 MCG (1000 UT) capsule Take 2,000 Units  by mouth daily.    [provider]  levothyroxine (EUTHYROX) 112 MCG tablet Take 1 tablet (112 mcg total) by mouth daily before breakfast. 03/28/21   Glean Hess, MD  lisinopril (ZESTRIL) 10 MG tablet Take 10 mg by mouth daily.    [provider]  MAGNESIUM PO Take 1,000 mg by mouth daily.    [provider]  Multiple Vitamins-Minerals (MULTIVITAL-M PO) Take 1 tablet by mouth daily.    [provider]  omeprazole (PRILOSEC) 20 MG capsule Take 20 mg by mouth daily.    [provider]  tamsulosin (FLOMAX) 0.4 MG CAPS capsule Take 0.8 mg by mouth daily after supper.    [provider]    Physical Exam: Vitals:   08/31/21 0939 08/31/21 0940 08/31/21 1057 08/31/21 1100  BP: (!) 168/78  (!) 164/81 (!) 179/93  Pulse: 63  64 63  Resp: 18  15 17   Temp: 97.7 F (36.5 C)     TempSrc: Oral     SpO2: 97%  98% 99%  Weight:  95.3 kg    Height:  5\' 11"  (1.803 m)     General: Not in acute distress HEENT:       Eyes: PERRL, EOMI, no  scleral icterus.       ENT: No discharge from the ears and nose, no pharynx injection, no tonsillar enlargement.        Neck: No JVD, no bruit, no mass felt. Heme: No neck lymph node enlargement. Cardiac: S1/S2, RRR, No murmurs, No gallops or rubs. Respiratory: No rales, wheezing, rhonchi or rubs. GI: Soft, nondistended, nontender, no rebound pain, no organomegaly, BS present. GU: No hematuria Ext: No pitting leg edema bilaterally. 1+DP/PT pulse bilaterally. Musculoskeletal: No joint deformities, No joint redness or warmth, no limitation of ROM in spin. Skin: No rashes.  Neuro: Alert, oriented X3, cranial nerves II-XII grossly intact, moves all extremities normally. Muscle strength 5/5 in all extremities, sensation to light touch intact is decreased in left face and left arm. Psych: Patient is not psychotic, no suicidal or hemocidal ideation.  Labs on Admission: I have personally reviewed following labs and  imaging studies  CBC: Recent Labs  Lab 08/31/21 0955  WBC 6.4  NEUTROABS 3.6  HGB 12.2*  HCT 36.1*  MCV 100.0  PLT 242   Basic Metabolic Panel: Recent Labs  Lab 08/31/21 0955 08/31/21 0957  NA 141  --   K 5.4*  --   CL 112*  --   CO2 24  --   GLUCOSE 85  --   BUN 43*  --   CREATININE 3.99* 4.20*  CALCIUM 8.9  --    GFR: Estimated Creatinine Clearance: 19.6 mL/min (A) (by C-G formula based on SCr of 4.2 mg/dL (H)). Liver Function Tests: Recent Labs  Lab 08/31/21 0955  AST 19  ALT 11  ALKPHOS 98  BILITOT 0.8  PROT 7.1  ALBUMIN 3.6   No results for input(s): LIPASE, AMYLASE in the last 168 hours. No results for input(s): AMMONIA in the last 168 hours. Coagulation Profile: Recent Labs  Lab 08/31/21 0955  INR 1.0   Cardiac Enzymes: No results for input(s): CKTOTAL, CKMB, CKMBINDEX, TROPONINI in the last 168 hours. BNP (last 3 results) No results for input(s): PROBNP in the last 8760 hours. HbA1C: No results for input(s): HGBA1C in the last 72 hours. CBG: Recent Labs  Lab 08/31/21 0937  GLUCAP 92   Lipid Profile: No results for input(s): CHOL, HDL, LDLCALC, TRIG, CHOLHDL, LDLDIRECT in the last 72 hours. Thyroid Function Tests: Recent Labs    08/31/21 1007  TSH 28.489*   Anemia Panel: No results for input(s): VITAMINB12, FOLATE, FERRITIN, TIBC, IRON, RETICCTPCT in the last 72 hours. Urine analysis:    Component Value Date/Time   COLORURINE YELLOW 06/29/2021 1614   APPEARANCEUR Cloudy (A) 07/10/2021 1345   LABSPEC 1.020 06/29/2021 1614   PHURINE 5.5 06/29/2021 1614   GLUCOSEU Negative 07/10/2021 1345   HGBUR TRACE (A) 06/29/2021 1614   BILIRUBINUR Negative 07/10/2021 [place] 06/29/2021 1614   PROTEINUR 3+ (A) 07/10/2021 1345   PROTEINUR 100 (A) 06/29/2021 1614   UROBILINOGEN 0.2 03/26/2021 1433   NITRITE Negative 07/10/2021 1345   NITRITE NEGATIVE 06/29/2021 1614   LEUKOCYTESUR 1+ (A) 07/10/2021 1345   LEUKOCYTESUR  NEGATIVE 06/29/2021 1614   Sepsis Labs: @LABRCNTIP (procalcitonin:4,lacticidven:4) ) Recent Results (from the past 240 hour(s))  Resp Panel by RT-PCR (Flu A&B, Covid) Nasopharyngeal Swab     Status: Abnormal   Collection Time: 08/31/21 11:01 AM   Specimen: Nasopharyngeal Swab; Nasopharyngeal(NP) swabs in vial transport medium  Result Value Ref Range Status   SARS Coronavirus 2 by RT PCR POSITIVE (A) NEGATIVE Final    Comment: RESULT CALLED  TO, READ BACK BY AND VERIFIED WITH: Gae Gallop 08/31/21 1232 KLW (NOTE) SARS-CoV-2 target nucleic acids are DETECTED.  The SARS-CoV-2 RNA is generally detectable in upper respiratory specimens during the acute phase of infection. Positive results are indicative of the presence of the identified virus, but do not rule out bacterial infection or co-infection with other pathogens not detected by the test. Clinical correlation with patient history and other diagnostic information is necessary to determine patient infection status. The expected result is Negative.  Fact Sheet for Patients: EntrepreneurPulse.com.au  Fact Sheet for Healthcare Providers: IncredibleEmployment.be  This test is not yet approved or cleared by the [place] FDA and  has been authorized for detection and/or diagnosis of SARS-CoV-2 by FDA under an Emergency Use Authorization (EUA).  This EUA will remain in effect (meaning this test can be [place] ed) for the duration of  the COVID-19 declaration under Section 564(b)(1) of the Act, 21 U.S.C. section 360bbb-3(b)(1), unless the authorization is terminated or revoked sooner.     Influenza A by PCR NEGATIVE NEGATIVE Final   Influenza B by PCR NEGATIVE NEGATIVE Final    Comment: (NOTE) The Xpert Xpress SARS-CoV-2/FLU/RSV plus assay is intended as an aid in the diagnosis of influenza from Nasopharyngeal swab specimens and should not be used as a sole basis for treatment. Nasal washings  and aspirates are unacceptable for Xpert Xpress SARS-CoV-2/FLU/RSV testing.  Fact Sheet for Patients: EntrepreneurPulse.com.au  Fact Sheet for Healthcare Providers: IncredibleEmployment.be  This test is not yet approved or cleared by the [place] FDA and has been authorized for detection and/or diagnosis of SARS-CoV-2 by FDA under an Emergency Use Authorization (EUA). This EUA will remain in effect (meaning this test can be used) for the duration of the COVID-19 declaration under Section 564(b)(1) of the Act, 21 U.S.C. section 360bbb-3(b)(1), unless the authorization is terminated or revoked.  Performed at [facility], [address] [place], [place] [address]      Radiological Exams on Admission: MR ANGIO HEAD WO CONTRAST  Result Date: 08/31/2021 CLINICAL DATA:  Neuro deficit, acute, stroke suspected. Additional history provided: Patient reports dizziness with arm/face numbness. EXAM: MRI HEAD WITHOUT CONTRAST MRA HEAD WITHOUT CONTRAST MRA NECK WITHOUT CONTRAST TECHNIQUE: Multiplanar, multi-echo pulse sequences of the brain and surrounding structures were acquired without intravenous contrast. Angiographic images of the Circle of Willis were acquired using MRA technique without intravenous contrast. Angiographic images of the neck were acquired using MRA technique without intravenous contrast. Carotid stenosis measurements (when applicable) are obtained utilizing NASCET criteria, using the distal internal carotid diameter as the denominator. COMPARISON:  Noncontrast head CT 08/31/2021. FINDINGS: MRI HEAD FINDINGS Brain: Intermittently motion degraded exam. Most notably, there is moderate motion degradation of the axial T2 TSE sequence. Cerebral volume is normal for age. Minimal multifocal T2 FLAIR hyperintense signal abnormality within the cerebral white matter, nonspecific but compatible with chronic small vessel ischemic disease. There  is no acute infarct. No evidence of an intracranial mass. No chronic intracranial blood products. No extra-axial fluid collection. No midline shift. Vascular: Maintained flow voids within the proximal large arterial vessels. Skull and upper cervical spine: No focal suspicious marrow lesion. Incompletely assessed cervical spondylosis. Sinuses/Orbits: Visualized orbits show no acute finding. Mild scattered mucosal thickening within the left frontal and bilateral ethmoid sinuses at the imaged levels. Other: Trace fluid within the bilateral mastoid air cells. MRA HEAD FINDINGS Mildly motion degraded exam. Anterior circulation: The intracranial internal carotid arteries are patent. The M1 middle cerebral arteries  are patent. No M2 proximal branch occlusion or high-grade proximal stenosis is identified. The anterior cerebral arteries are patent. 1-2 mm vascular protrusion arising from the pre cavernous right internal carotid artery, which which may reflect atherosclerotic irregularity or a small aneurysm (series 1033, image 14). 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 Posterior circulation: The intracranial vertebral arteries are patent. The basilar artery is patent. The posterior cerebral arteries are patent. 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 Severe stenosis within left posterior cerebral artery at the P2/P3 junction. Posterior communicating arteries are diminutive or absent bilaterally. Anatomic variants: As described. MRA NECK FINDINGS Moderately motion degraded examination. The proximal common carotid arteries and proximal vertebral arteries are excluded from the field of view. The distal cervical internal carotid arteries and cervical vertebral arteries are excluded from the field of view. The visualized common carotid, internal carotid and  vertebral arteries are patent within the neck. Within the limitations of motion degradation, there is no appreciable hemodynamically significant stenosis (50% or greater) within these vessels at the imaged levels. IMPRESSION: MRI brain: 1. Intermittently motion degraded exam, as described. 2. No evidence of acute intracranial abnormality. 3. Minimal chronic small vessel ischemic changes within the cerebral white matter. 4. Mild left frontal and bilateral ethmoid sinus mucosal thickening. 5. Trace fluid within the bilateral mastoid air cells. MRA head: 1. Mildly motion degraded exam. 2. 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 3. Severe stenosis within the left posterior cerebral artery at the P2/P3 junction. 4. 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 5. 1-2 mm vascular protrusion arising from the pre-cavernous right internal carotid artery, which may reflect atherosclerotic lobulation or an additional small aneurysm. MRA neck: 1. Moderately motion degraded exam. 2. Portions of the proximal common carotid and vertebral arteries are excluded from the field of view. Additionally, portions of the distal cervical internal carotid and cervical vertebral arteries are excluded from the field of view. 3. Within the limitations of motion degradation, there is no appreciable hemodynamically significant stenosis of the common carotid, cervical internal carotid or cervical vertebral arteries at the imaged levels. Electronically Signed   By: Kellie Simmering D.O.   On: 08/31/2021 11:19   MR ANGIO NECK WO CONTRAST  Result Date: 08/31/2021 CLINICAL DATA:  Neuro deficit, acute, stroke suspected. Additional history provided: Patient reports dizziness with arm/face numbness. EXAM: MRI HEAD WITHOUT CONTRAST MRA HEAD WITHOUT CONTRAST MRA NECK WITHOUT CONTRAST TECHNIQUE: Multiplanar, multi-echo pulse sequences of the  brain and surrounding structures were acquired without intravenous contrast. Angiographic images of the Circle of Willis were acquired using MRA technique without intravenous contrast. Angiographic images of the neck were acquired using MRA technique without intravenous contrast. Carotid stenosis measurements (when applicable) are obtained utilizing NASCET criteria, using the distal internal carotid diameter as the denominator. COMPARISON:  Noncontrast head CT 08/31/2021. FINDINGS: MRI HEAD FINDINGS Brain: Intermittently motion degraded exam. Most notably, there is moderate motion degradation of the axial T2 TSE sequence. Cerebral volume is normal for age. Minimal multifocal T2 FLAIR hyperintense signal abnormality within the cerebral white matter, nonspecific but compatible with chronic small vessel ischemic disease. There is no acute infarct. No evidence of an intracranial mass. No chronic intracranial blood products. No extra-axial fluid collection. No midline shift. Vascular: Maintained flow voids within the proximal large arterial  vessels. Skull and upper cervical spine: No focal suspicious marrow lesion. Incompletely assessed cervical spondylosis. Sinuses/Orbits: Visualized orbits show no acute finding. Mild scattered mucosal thickening within the left frontal and bilateral ethmoid sinuses at the imaged levels. Other: Trace fluid within the bilateral mastoid air cells. MRA HEAD FINDINGS Mildly motion degraded exam. Anterior circulation: The intracranial internal carotid arteries are patent. The M1 middle cerebral arteries are patent. No M2 proximal branch occlusion or high-grade proximal stenosis is identified. The anterior cerebral arteries are patent. 1-2 mm vascular protrusion arising from the pre cavernous right internal carotid artery, which which may reflect atherosclerotic irregularity or a small aneurysm (series 1033, image 14). 2-3 mm broad-based inferiorly projecting aneurysm arising from the  cavernous left internal carotid artery (for instance as seen on series 1033, image 4). Posterior circulation: The intracranial vertebral arteries are patent. The basilar artery is patent. The posterior cerebral arteries are patent. 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 Severe stenosis within left posterior cerebral artery at the P2/P3 junction. Posterior communicating arteries are diminutive or absent bilaterally. Anatomic variants: As described. MRA NECK FINDINGS Moderately motion degraded examination. The proximal common carotid arteries and proximal vertebral arteries are excluded from the field of view. The distal cervical internal carotid arteries and cervical vertebral arteries are excluded from the field of view. The visualized common carotid, internal carotid and vertebral arteries are patent within the neck. Within the limitations of motion degradation, there is no appreciable hemodynamically significant stenosis (50% or greater) within these vessels at the imaged levels. IMPRESSION: MRI brain: 1. Intermittently motion degraded exam, as described. 2. No evidence of acute intracranial abnormality. 3. Minimal chronic small vessel ischemic changes within the cerebral white matter. 4. Mild left frontal and bilateral ethmoid sinus mucosal thickening. 5. Trace fluid within the bilateral mastoid air cells. MRA head: 1. Mildly motion degraded exam. 2. 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 3. Severe stenosis within the left posterior cerebral artery at the P2/P3 junction. 4. 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 5. 1-2 mm vascular protrusion arising from the pre-cavernous right internal carotid artery, which may reflect atherosclerotic lobulation or an additional small aneurysm. MRA neck: 1. Moderately motion degraded exam. 2. Portions of the  proximal common carotid and vertebral arteries are excluded from the field of view. Additionally, portions of the distal cervical internal carotid and cervical vertebral arteries are excluded from the field of view. 3. Within the limitations of motion degradation, there is no appreciable hemodynamically significant stenosis of the common carotid, cervical internal carotid or cervical vertebral arteries at the imaged levels. Electronically Signed   By: Kellie Simmering D.O.   On: 08/31/2021 11:19   MR BRAIN WO CONTRAST  Result Date: 08/31/2021 CLINICAL DATA:  Neuro deficit, acute, stroke suspected. Additional history provided: Patient reports dizziness with arm/face numbness. EXAM: MRI HEAD WITHOUT CONTRAST MRA HEAD WITHOUT CONTRAST MRA NECK WITHOUT CONTRAST TECHNIQUE: Multiplanar, multi-echo pulse sequences of the brain and surrounding structures were acquired without intravenous contrast. Angiographic images of the Circle of Willis were acquired using MRA technique without intravenous contrast. Angiographic images of the neck were acquired using MRA technique without intravenous contrast. Carotid stenosis measurements (when applicable) are obtained utilizing NASCET criteria, using the distal internal carotid diameter as the denominator. COMPARISON:  Noncontrast head CT 08/31/2021. FINDINGS: MRI HEAD FINDINGS Brain: Intermittently motion degraded exam. Most notably, there is moderate  motion degradation of the axial T2 TSE sequence. Cerebral volume is normal for age. Minimal multifocal T2 FLAIR hyperintense signal abnormality within the cerebral white matter, nonspecific but compatible with chronic small vessel ischemic disease. There is no acute infarct. No evidence of an intracranial mass. No chronic intracranial blood products. No extra-axial fluid collection. No midline shift. Vascular: Maintained flow voids within the proximal large arterial vessels. Skull and upper cervical spine: No focal suspicious marrow  lesion. Incompletely assessed cervical spondylosis. Sinuses/Orbits: Visualized orbits show no acute finding. Mild scattered mucosal thickening within the left frontal and bilateral ethmoid sinuses at the imaged levels. Other: Trace fluid within the bilateral mastoid air cells. MRA HEAD FINDINGS Mildly motion degraded exam. Anterior circulation: The intracranial internal carotid arteries are patent. The M1 middle cerebral arteries are patent. No M2 proximal branch occlusion or high-grade proximal stenosis is identified. The anterior cerebral arteries are patent. 1-2 mm vascular protrusion arising from the pre cavernous right internal carotid artery, which which may reflect atherosclerotic irregularity or a small aneurysm (series 1033, image 14). 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 Posterior circulation: The intracranial vertebral arteries are patent. The basilar artery is patent. The posterior cerebral arteries are patent. 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 Severe stenosis within left posterior cerebral artery at the P2/P3 junction. Posterior communicating arteries are diminutive or absent bilaterally. Anatomic variants: As described. MRA NECK FINDINGS Moderately motion degraded examination. The proximal common carotid arteries and proximal vertebral arteries are excluded from the field of view. The distal cervical internal carotid arteries and cervical vertebral arteries are excluded from the field of view. The visualized common carotid, internal carotid and vertebral arteries are patent within the neck. Within the limitations of motion degradation, there is no appreciable hemodynamically significant stenosis (50% or greater) within these vessels at the imaged levels. IMPRESSION: MRI brain: 1. Intermittently motion degraded exam, as described.  2. No evidence of acute intracranial abnormality. 3. Minimal chronic small vessel ischemic changes within the cerebral white matter. 4. Mild left frontal and bilateral ethmoid sinus mucosal thickening. 5. Trace fluid within the bilateral mastoid air cells. MRA head: 1. Mildly motion degraded exam. 2. 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 3. Severe stenosis within the left posterior cerebral artery at the P2/P3 junction. 4. 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 5. 1-2 mm vascular protrusion arising from the pre-cavernous right internal carotid artery, which may reflect atherosclerotic lobulation or an additional small aneurysm. MRA neck: 1. Moderately motion degraded exam. 2. Portions of the proximal common carotid and vertebral arteries are excluded from the field of view. Additionally, portions of the distal cervical internal carotid and cervical vertebral arteries are excluded from the field of view. 3. Within the limitations of motion degradation, there is no appreciable hemodynamically significant stenosis of the common carotid, cervical internal carotid or cervical vertebral arteries at the imaged levels. Electronically Signed   By: Kellie Simmering D.O.   On: 08/31/2021 11:19   ECHOCARDIOGRAM COMPLETE  Result Date: 08/31/2021    ECHOCARDIOGRAM REPORT   Patient Name:   GEOGE LAWRANCE Date of Exam: 08/31/2021 Medical Rec #:  [id number]     Height:       71.0 in Accession #:    [id number]    Weight:  210.0 lb Date of Birth:  November 22, 1950     BSA:          2.153 m Patient Age:    52 years      BP:           179/93 mmHg Patient Gender: M             HR:           63 bpm. Exam Location:  [facility] Procedure: 2D Echo, Cardiac Doppler and Color Doppler Indications:     Stroke I63.9  History:         Patient has no prior history of Echocardiogram examinations.                  COPD; Risk  Factors:Hypertension. CKD stage 4.  Sonographer:     Sherrie Sport Referring Phys:  [id number] Lorenza Chick Diagnosing Phys: Neoma Laming  Sonographer Comments: No parasternal window and Technically challenging study due to limited acoustic windows. IMPRESSIONS  1. Left ventricular ejection fraction, by estimation, is 60 to 65%. The left ventricle has normal function. The left ventricle has no regional wall motion abnormalities. There is mild concentric left ventricular hypertrophy. Left ventricular diastolic parameters are consistent with Grade I diastolic dysfunction (impaired relaxation).  2. Right ventricular systolic function is normal. The right ventricular size is normal.  3. The mitral valve is normal in structure. No evidence of mitral valve regurgitation. No evidence of mitral stenosis.  4. The aortic valve is normal in structure. Aortic valve regurgitation is not visualized. Mild aortic valve sclerosis is present, with no evidence of aortic valve stenosis.  5. The inferior vena cava is normal in size with greater than 50% respiratory variability, suggesting right atrial pressure of 3 mmHg. FINDINGS  Left Ventricle: Left ventricular ejection fraction, by estimation, is 60 to 65%. The left ventricle has normal function. The left ventricle has no regional wall motion abnormalities. The left ventricular internal cavity size was normal in size. There is  mild concentric left ventricular hypertrophy. Left ventricular diastolic parameters are consistent with Grade I diastolic dysfunction (impaired relaxation). Right Ventricle: The right ventricular size is normal. No increase in right ventricular wall thickness. Right ventricular systolic function is normal. Left Atrium: Left atrial size was normal in size. Right Atrium: Right atrial size was normal in size. Pericardium: There is no evidence of pericardial effusion. Mitral Valve: The mitral valve is normal in structure. No evidence of mitral valve regurgitation.  No evidence of mitral valve stenosis. Tricuspid Valve: The tricuspid valve is normal in structure. Tricuspid valve regurgitation is not demonstrated. No evidence of tricuspid stenosis. Aortic Valve: The aortic valve is normal in structure. Aortic valve regurgitation is not visualized. Mild aortic valve sclerosis is present, with no evidence of aortic valve stenosis. Aortic valve mean gradient measures 5.0 mmHg. Aortic valve peak gradient measures 8.9 mmHg. Aortic valve area, by VTI measures 1.97 cm. Pulmonic Valve: The pulmonic valve was normal in structure. Pulmonic valve regurgitation is not visualized. No evidence of pulmonic stenosis. Aorta: The aortic root is normal in size and structure. Venous: The inferior vena cava is normal in size with greater than 50% respiratory variability, suggesting right atrial pressure of 3 mmHg. IAS/Shunts: No atrial level shunt detected by color flow Doppler.  LEFT VENTRICLE PLAX 2D LVIDd:         4.47 cm   Diastology LVIDs:         2.96 cm   LV e' medial:  4.24 cm/s LV PW:         1.13 cm   LV E/e' medial:  19.3 LV IVS:        1.06 cm   LV e' lateral:   4.35 cm/s LVOT diam:     2.00 cm   LV E/e' lateral: 18.8 LV SV:         56 LV SV Index:   26 LVOT Area:     3.14 cm  RIGHT VENTRICLE RV Basal diam:  3.38 cm LEFT ATRIUM           Index        RIGHT ATRIUM           Index LA diam:      3.10 cm 1.44 cm/m   RA Area:     15.00 cm LA Vol (A4C): 26.7 ml 12.40 ml/m  RA Volume:   34.40 ml  15.98 ml/m  AORTIC VALVE AV Area (Vmax):    1.83 cm AV Area (Vmean):   1.86 cm AV Area (VTI):     1.97 cm AV Vmax:           149.00 cm/s AV Vmean:          101.350 cm/s AV VTI:            0.286 m AV Peak Grad:      8.9 mmHg AV Mean Grad:      5.0 mmHg LVOT Vmax:         86.80 cm/s LVOT Vmean:        60.000 cm/s LVOT VTI:          0.179 m LVOT/AV VTI ratio: 0.63  AORTA Ao Root diam: 2.70 cm MITRAL VALVE               TRICUSPID VALVE MV Area (PHT): 2.44 cm    TR Peak grad:   17.6 mmHg MV  Decel Time: 311 msec    TR Vmax:        210.00 cm/s MV E velocity: 81.80 cm/s MV A velocity: 90.40 cm/s  SHUNTS MV E/A ratio:  0.90        Systemic VTI:  0.18 m                            Systemic Diam: 2.00 cm Neoma Laming Electronically signed by Neoma Laming Signature Date/Time: 08/31/2021/2:18:48 PM    Final    CT HEAD CODE STROKE WO CONTRAST  Result Date: 08/31/2021 CLINICAL DATA:  Code stroke. Acute neuro deficit. Left-sided weakness and dizziness EXAM: CT HEAD WITHOUT CONTRAST TECHNIQUE: Contiguous axial images were obtained from the base of the skull through the vertex without intravenous contrast. COMPARISON:  None. FINDINGS: Brain: No evidence of acute infarction, hemorrhage, hydrocephalus, extra-axial collection or mass lesion/mass effect. Vascular: Negative for hyperdense vessel Skull: Negative Sinuses/Orbits: Negative Other: None ASPECTS ([facility] Stroke Program Early CT Score) - Ganglionic level infarction (caudate, lentiform nuclei, internal capsule, insula, M1-M3 cortex): 7 - Supraganglionic infarction (M4-M6 cortex): 3 Total score (0-10 with 10 being normal): 10 IMPRESSION: 1. Negative CT head 2. ASPECTS is 10 Code stroke imaging results were communicated on 08/31/2021 at 9:55 am to provider Bhagat via text page Electronically Signed   By: Franchot Gallo M.D.   On: 08/31/2021 09:57     EKG: I have personally reviewed.  Sinus rhythm, QTc 415, low voltage, mild T wave inversion in V5-V6.  Assessment/Plan Principal  Problem:   TIA (transient ischemic attack) Active Problems:   BPH with obstruction/lower urinary tract symptoms   Acquired hypothyroidism   Tobacco use disorder   CKD (chronic kidney disease) stage 4, GFR 15-29 ml/min (HCC)   Chronic obstructive pulmonary disease, unspecified (HCC)   Hypertension   Hyperlipidemia   Hyperkalemia   COVID-19 virus infection  TIA (transient ischemic attack) vs. MRI negative stroke: Patient has left-sided numbness and weakness.  MRI of the  brain has motion degradation, but is negative for infarction.  Dr. Curly Shores of neurology is consulted.  - Placed on MedSurg bed for observation - will hold oral Bp meds to allow permissive HTN in the setting of acute stroke, for SBP>220 or dBP>120 - per Dr. Rada Hay, prophylactic therapy-Antiplatelet med: Aspirin - dose 325mg  PO or 300mg  PR, followed by 81 mg daily - per Dr. Curly Shores, plavix 300 mg load with 75 mg daily for 21 - 90 day course  - fasting lipid panel and HbA1c  - 2D transthoracic echocardiography  - swallowing screen. If fails, will get SLP - Check UDS  - PT/OT consult  BPH with obstruction/lower urinary tract symptoms -flomax  Acquired hypothyroidism: TSH is elevated at 28.489 today -Increase levothyroxine from 112 to 125 mcg daily -Recheck TSH in 6-8 weeks  Tobacco use disorder -Nicotine patch  CKD (chronic kidney disease) stage 4, GFR 15-29 ml/min ([place]): Recent baseline creatinine 3.19 on 06/29/2021.  His creatinine is 4.20, BUN 43, worsening in the baseline. -Lisinopril is on hold -Avoid using renal toxic medications -Gentle IV fluid: 50 cc/h of normal saline -Consulted Dr. Holley Raring of nephrology  Chronic obstructive pulmonary disease, unspecified ([place]) -Bronchodilators  Hypertension - will hold oral Bp meds to allow permissive HTN in the setting of acute stroke -As needed hydralazine for SBP>220 or dBP>120  Hyperlipidemia -Lipitor  Hyperkalemia: Potassium 5.4 -Leukoma 10 g  COVID-19 virus infection: Patient is asymptomatic.  No oxygen desaturation.  Chest x-ray negative. -No treatment needed     DVT ppx: SQ Heparin    Code Status: Full code Family Communication:  Yes, patient's son and daughter-in law at bed side Disposition Plan:  Anticipate discharge back to previous environment Consults called:  Dr. Holley Raring of renal and Dr. Curly Shores of neuro Admission status and Level of care: Med-Surg:    Med-surg bed for obs    Status is: Observation  The patient  remains OBS appropriate and will d/c before 2 midnights.         Date of Service 08/31/2021    Ivor Costa [facility]   If 7PM-7AM, please contact night-coverage [email or web address] 08/31/2021, 2:28 PM

## 2021-08-31 NOTE — Consult Note (Signed)
CODE STROKE- PHARMACY COMMUNICATION   Time CODE STROKE called/page received:0940  Time response to CODE STROKE was made (in person or via phone): 0940  TNK not given since pt had symptom onset 2200 on 10/20  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   Chronic bilateral low back pain without sciatica 03/26/2021   S/p 4 lumbar spine surgeries   Chronic obstructive pulmonary disease, unspecified ([place]) 07/11/2021   CKD (chronic kidney disease) stage 4, GFR 15-29 ml/min ([place]) 03/27/2021   Essential hypertension 03/26/2021   GERD (gastroesophageal reflux disease)    Hyperlipidemia    Hypertension    Mixed hyperlipidemia 03/26/2021   Thyroid disease    Prior to Admission medications   Medication Sig Start Date End Date Taking? Authorizing Provider  acetaminophen (TYLENOL) 500 MG tablet Take 1,000 mg by mouth every 6 (six) hours as needed for moderate pain.    [provider]  atorvastatin (LIPITOR) 40 MG tablet Take 40 mg by mouth daily. Patient not taking: Reported on 07/18/2021    [provider]  carvedilol (COREG) 25 MG tablet Take 25 mg by mouth 2 (two) times daily with a meal.    [provider]  Cholecalciferol (D3-1000) 25 MCG (1000 UT) capsule Take 2,000 Units by mouth daily.    [provider]  levothyroxine (EUTHYROX) 112 MCG tablet Take 1 tablet (112 mcg total) by mouth daily before breakfast. 03/28/21   Glean Hess, MD  lisinopril (ZESTRIL) 10 MG tablet Take 10 mg by mouth daily.    [provider]  MAGNESIUM PO Take 1,000 mg by mouth daily.    [provider]  Multiple Vitamins-Minerals (MULTIVITAL-M PO) Take 1 tablet by mouth daily.    [provider]  omeprazole (PRILOSEC) 20 MG capsule Take 20 mg by mouth daily.    [provider]  tamsulosin (FLOMAX) 0.4 MG CAPS capsule Take 0.8 mg by mouth daily after  supper.    [provider]    Narda Rutherford, PharmD Pharmacy Resident  08/31/2021 10:45 AM

## 2021-08-31 NOTE — ED Triage Notes (Signed)
Son states that approx 10 after nine this am he started having left sided weakness at the [employer] parking lot. Pt states diminshed facial sensation on the left side, left arm and left leg weakness  Cbg 92 in triage Took meds last pm

## 2021-08-31 NOTE — ED Notes (Signed)
Dr Kathi Ludwig at ct bedside of pt for exam, belongings and clothing removed from pt and bagged and given to pt's son

## 2021-08-31 NOTE — ED Notes (Signed)
Teleneuro activated.  

## 2021-08-31 NOTE — Progress Notes (Signed)
SLP Cancellation Note  Patient Details Name: Tyrone Moore MRN: [id number] DOB: [birth year]   Cancelled treatment:       Reason Eval/Treat Not Completed: SLP screened, no needs identified, will sign off (chart reviewed; met w/ pt and Son in ED room).  Pt is COVID+.  Pt denied any difficulty swallowing and is currently on a regular diet; had just finished a lunch mea/sandwich w/out difficulty per Son/pt. He tolerates swallowing pills w/ water per NSG. Pt conversed in conversation w/out overt expressive/receptive deficits noted; pt denied any speech-language deficits. Speech clear at his Baseline. No further skilled ST services indicated as pt appears at his baseline. Pt agreed. NSG to reconsult if any change in status while admitted.        Orinda Kenner, MS, CCC-SLP Speech Language Pathologist Rehab Services [phone] [facility] 08/31/2021, 2:58 PM

## 2021-08-31 NOTE — Progress Notes (Signed)
Neurology Progress Note   Patient ID: Tyrone Moore is a 70 y.o. male with a past medical history significant for hypertension, hyperlipidemia, COPD, hypothyroidism, ongoing tobacco abuse (1 pack/day), CKD stage IV.   He reports he is in his usual state of health when he went to bed at 8 PM last night.  He woke up at 10 PM and went to use bathroom and felt very dizzy.  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  His son additionally initially attributed this to the patient being perhaps slightly hypoglycemic, but then the patient was complaining of left-sided face and arm numbness and he brought him to the ED for evaluation for stroke, he does feel weak on the left side.  Patient denies any headache, vision issues, nausea, vomiting or other recent focal neurological deficits.  He endorses a chronic cough related to his smoking but feels this is no worse than normal.  He reports no blood in his urine or stool or bleeding concerns at this time (did have a cystoscopy in August).  He does not note any other recent infectious or systemic symptoms on full review of systems.  Major interval events/Subjective: -Reports he feels much improved, that he had not been taking his atorvastatin at home for 2 months due to being out of his prescription, and that his nicotine patch that he has on here is working better than anything else he is ever used for smoking cessation before  Exam: Vitals:   09/01/21 0452 09/01/21 0754  BP: (!) 162/82 (!) 162/75  Pulse: 66 70  Resp: 16 19  Temp: (!) 97.4 F (36.3 C) (!) 97.5 F (36.4 C)  SpO2: 95% 100%   Gen: In bed, comfortable  Resp: non-labored breathing, no grossly audible wheezing Cardiac: Perfusing extremities well  Abd: soft, nt  Mental Status: Patient is awake, alert, oriented to person, place, month, age and situation. Patient is able  to give a clear and coherent history. No signs of aphasia or neglect Cranial Nerves: II: Visual Fields are full. Pupils are equal, round, and reactive to light.   III,IV, VI: EOMI without ptosis or diploplia. Direction changing nystagmus on lateral gaze persists  V: Facial sensation is reduced to light touch on the left face, improved since prior VII: Facial movement is notable for a now a very subtle left facial droop VIII: hearing is intact to voice X: Uvula elevates symmetrically XI: Shoulder shrug is symmetric. XII: tongue is midline without atrophy or fasciculations.  Motor: Tone is normal. Bulk is normal.  No drift of the bilateral upper or lower extremities. 5/5 throughout Sensory: Sensation is reduced to light touch in the left face and arm Cerebellar: Finger to nose intact bilaterally   Pertinent Labs:  Lab Results  Component Value Date   HGBA1C 6.0 (H) 08/31/2021    Lab Results  Component Value Date   CHOL 272 (H) 09/01/2021   HDL 34 (L) 09/01/2021   LDLCALC 197 (H) 09/01/2021   TRIG 206 (H) 09/01/2021   CHOLHDL 8.0 09/01/2021    Unresulted Labs (From admission, onward)     Start     Ordered   09/01/21 0500  Lipid panel  (Labs)  Tomorrow morning,   R       Comments: Fasting    08/31/21 1029   09/01/21 0500  Basic  metabolic panel  Tomorrow morning,   R        08/31/21 1208   08/31/21 1207  HIV Antibody (routine testing w rflx)  (HIV Antibody (Routine testing w reflex) panel)  Once,   STAT        08/31/21 1208   08/31/21 1036  Hemoglobin A1c  (Labs)  Add-on,   AD        08/31/21 1035   08/31/21 1027  Urine Drug Screen, Qualitative ([place] only)  (Labs)  Once,   STAT        08/31/21 1029           ECHO:    1. Left ventricular ejection fraction, by estimation, is 60 to 65%. The  left ventricle has normal function. The left ventricle has no regional  wall motion abnormalities. There is mild concentric left ventricular  hypertrophy. Left ventricular  diastolic  parameters are consistent with Grade I diastolic dysfunction (impaired  relaxation).   2. Right ventricular systolic function is normal. The right ventricular  size is normal.   3. The mitral valve is normal in structure. No evidence of mitral valve  regurgitation. No evidence of mitral stenosis.   4. The aortic valve is normal in structure. Aortic valve regurgitation is  not visualized. Mild aortic valve sclerosis is present, with no evidence  of aortic valve stenosis.   5. The inferior vena cava is normal in size with greater than 50%  respiratory variability, suggesting right atrial pressure of 3 mmHg.   Impression: MRI negative brainstem stroke secondary to small vessel risk factors  Recommendations: - LDL increased from 70 in April to 129 this admission --resume home atorva 40 mg given this is in the setting of medication nonadherence, consider increase if LDL remains elevated above goal after resuming his statin - A1c at goal < 7.0% - Continue DAPT x 21 days, then aspirin monotherapy lifelong - longterm BP goal normotension, please resume home blood pressure medications in a stepwise fashion, start 1 tomorrow and then the second agent on PCP follow-up - Please prescribe nicotine patch that he has been getting here in the hospital on discharge  [place] [phone]  [facility] coverage for [facility] is from 8 AM to 4 AM in-house and 4 PM to 8 PM by telephone/video. 8 PM to 8 AM emergent questions or overnight urgent questions should be addressed to Teleneurology On-call or [facility] neurohospitalist; contact information can be found on AMION  Greater than 35 minutes were spent in care of this patient today, greater than 50% of which were at bedside

## 2021-09-01 ENCOUNTER — Encounter: Payer: Self-pay | Admitting: Internal Medicine

## 2021-09-01 DIAGNOSIS — Z8673 Personal history of transient ischemic attack (TIA), and cerebral infarction without residual deficits: Secondary | ICD-10-CM | POA: Diagnosis present

## 2021-09-01 DIAGNOSIS — E785 Hyperlipidemia, unspecified: Secondary | ICD-10-CM | POA: Diagnosis not present

## 2021-09-01 DIAGNOSIS — I639 Cerebral infarction, unspecified: Secondary | ICD-10-CM | POA: Diagnosis not present

## 2021-09-01 HISTORY — DX: Cerebral infarction, unspecified: I63.9

## 2021-09-01 LAB — LIPID PANEL
Cholesterol: 272 mg/dL — ABNORMAL HIGH (ref 0–200)
HDL: 34 mg/dL — ABNORMAL LOW (ref >40)
LDL Cholesterol: 197 mg/dL — ABNORMAL HIGH (ref 0–99)
Total CHOL/HDL Ratio: 8 RATIO
Triglycerides: 206 mg/dL — ABNORMAL HIGH (ref <150)
VLDL: 41 mg/dL — ABNORMAL HIGH (ref 0–40)

## 2021-09-01 LAB — BASIC METABOLIC PANEL
Anion gap: 5 (ref 5–15)
BUN: 42 mg/dL — ABNORMAL HIGH (ref 8–23)
CO2: 23 mmol/L (ref 22–32)
Calcium: 8.7 mg/dL — ABNORMAL LOW (ref 8.9–10.3)
Chloride: 113 mmol/L — ABNORMAL HIGH (ref 98–111)
Creatinine, Ser: 3.53 mg/dL — ABNORMAL HIGH (ref 0.61–1.24)
GFR, Estimated: 18 mL/min — ABNORMAL LOW (ref >60)
Glucose, Bld: 98 mg/dL (ref 70–99)
Potassium: 4.9 mmol/L (ref 3.5–5.1)
Sodium: 141 mmol/L (ref 135–145)

## 2021-09-01 LAB — MAGNESIUM: Magnesium: 2.6 mg/dL — ABNORMAL HIGH (ref 1.7–2.4)

## 2021-09-01 MED ORDER — ASPIRIN 81 MG PO TBEC: 81.0000 mg | DELAYED_RELEASE_TABLET | Freq: Every day | ORAL | 11 refills | Status: DC

## 2021-09-01 MED ORDER — NICOTINE 21 MG/24HR TD PT24: 21.0000 mg | MEDICATED_PATCH | Freq: Every day | TRANSDERMAL | 0 refills | Status: DC

## 2021-09-01 MED ORDER — CLOPIDOGREL BISULFATE 75 MG PO TABS: 75.0000 mg | ORAL_TABLET | Freq: Every day | ORAL | 0 refills | Status: DC

## 2021-09-01 MED ORDER — ATORVASTATIN CALCIUM 40 MG PO TABS: 40.0000 mg | ORAL_TABLET | Freq: Every day | ORAL | 1 refills | Status: DC

## 2021-09-01 NOTE — Evaluation (Signed)
Occupational Therapy Evaluation Patient Details Name: Tyrone Moore MRN: [id number] DOB: 16-Apr-1951 Today's Date: 09/01/2021   History of Present Illness presented to ER secondary to L UE/LE weakness and numbness; admitted for TIA/CVA work up.  MRI negative for acute infarct, but suspicious for clinical TIA/CVA remains.   Clinical Impression   Chart reviewed, RN cleared pt for participation in OT evaluation. Pt in agreement  for OT evaluation. Pt lives with son and his family, they are available to assist as needed. Pt performs ADLs with CLOSE SUP due to slight balance deficits including ADL supplies set up, he is independent in all ADL/IADL PTA. Pt presents with deficits in L hand FMC/dexterity, grip strength. Pt endorses numbness/tinging in thumb, D1. He reports this does not affect his sleep. Negative phalens test on this date, however pt educated on further follow up with MD if symptoms persist. Pt educated on L wrist flexion/extension sustained stretch, FMC/dexterity/grip strength tasks for functional improvement in L hand function, outpatient OT recommendation if L hand function continues to impair functional performance. Pt is left in bedside chair, NAD, all needs met +chair alarm.      Recommendations for follow up therapy are one component of a multi-disciplinary discharge planning process, led by the attending physician.  Recommendations may be updated based on patient status, additional functional criteria and insurance authorization.   Follow Up Recommendations  Outpatient OT    Equipment Recommendations       Recommendations for Other Services       Precautions / Restrictions Precautions Precautions: Fall Restrictions Weight Bearing Restrictions: No      Mobility Bed Mobility Overal bed mobility: Modified Independent                  Transfers Overall transfer level: Needs assistance Equipment used: None Transfers: Sit to/from Stand Sit to Stand:  Supervision         General transfer comment: good LE strength/control    Balance Overall balance assessment: Needs assistance Sitting-balance support: No upper extremity supported;Feet supported Sitting balance-Leahy Scale: Good     Standing balance support: No upper extremity supported Standing balance-Leahy Scale: Fair                   Standardized Balance Assessment Standardized Balance Assessment : Berg Balance Test Berg Balance Test Sit to Stand: Able to stand without using hands and stabilize independently Standing Unsupported: Able to stand safely 2 minutes Sitting with Back Unsupported but Feet Supported on Floor or Stool: Able to sit safely and securely 2 minutes Stand to Sit: Sits safely with minimal use of hands Transfers: Able to transfer safely, minor use of hands Standing Unsupported with Eyes Closed: Able to stand 10 seconds with supervision Standing Ubsupported with Feet Together: Able to place feet together independently but unable to hold for 30 seconds From Standing, Reach Forward with Outstretched Arm: Can reach confidently >25 cm (10") From Standing Position, Pick up Object from Floor: Able to pick up shoe, needs supervision From Standing Position, Turn to Look Behind Over each Shoulder: Turn sideways only but maintains balance Turn 360 Degrees: Needs close supervision or verbal cueing Standing Unsupported, Alternately Place Feet on Step/Stool: Needs assistance to keep from falling or unable to try Standing Unsupported, One Foot in Front: Able to take small step independently and hold 30 seconds Standing on One Leg: Unable to try or needs assist to prevent fall Total Score: 37       ADL either performed or  assessed with clinical judgement   ADL                                         General ADL Comments: CLOSE SUP for all ADLs and IADLs including ADL equipment set up. Sequencing and safety awareness during ADL completion WFL.      Vision Baseline Vision/History: 1 Wears glasses Patient Visual Report: No change from baseline       Perception     Praxis      Pertinent Vitals/Pain Pain Assessment: No/denies pain     Hand Dominance Right   Extremity/Trunk Assessment Upper Extremity Assessment Upper Extremity Assessment: LUE deficits/detail;RUE deficits/detail RUE Deficits / Details: WFL LUE Deficits / Details: reported numbness/tingling on dorsal side of L hand thumb and D1, reports worsening since admission however symptoms started 1-2 years ago, sensation appears WFL; poor opposition to D 4-5; fair grip strength; Recommended outpatient consult for potential carpal tunnel LUE Sensation: WNL LUE Coordination: decreased fine motor   Lower Extremity Assessment Lower Extremity Assessment:  (L LE grossly 4+/5 throughout, mild coordination deficits; denies sensory deficit)       Communication Communication Communication: No difficulties   Cognition Arousal/Alertness: Awake/alert Behavior During Therapy: WFL for tasks assessed/performed Overall Cognitive Status: Within Functional Limits for tasks assessed                                 General Comments: A&O x4   General Comments       Exercises General Exercises - Upper Extremity Wrist Extension: PROM;10 reps Other Exercises Other Exercises: FMC/dexterity tasks with functional items at home in order to improve L hand function;   Shoulder Instructions      Home Living Family/patient expects to be discharged to:: Private residence Living Arrangements: Children (adult son) Available Help at Discharge: Family Type of Home: House Home Access: Stairs to enter Technical brewer of Steps: 5 Entrance Stairs-Rails: Right;Left Home Layout: One level         Biochemist, clinical: [place]: None          Prior Functioning/Environment Level of Independence: Independent        Comments: Independent in  ADL/IADL        OT Problem List: Decreased strength;Decreased range of motion;Decreased coordination;Impaired UE functional use      OT Treatment/Interventions: Self-care/ADL training;Therapeutic exercise;Patient/family education;Neuromuscular education;Energy conservation;DME and/or AE instruction    OT Goals(Current goals can be found in the care plan section) Acute Rehab OT Goals Patient Stated Goal: to go home OT Goal Formulation: With patient Time For Goal Achievement: 09/15/21 Potential to Achieve Goals: Good ADL Goals Pt Will Perform Grooming: Independently;standing Pt Will Perform Toileting - Clothing Manipulation and hygiene: Independently Additional ADL Goal #1: Pt will improve B hand [place] as evidenced by completing package/container management with complete independence  OT Frequency: Min 1X/week    AM-PAC OT "6 Clicks" Daily Activity     Outcome Measure Help from another person eating meals?: None Help from another person taking care of personal grooming?: None Help from another person toileting, which includes using toliet, bedpan, or urinal?: None Help from another person bathing (including washing, rinsing, drying)?: None Help from another person to put on and taking off regular upper body clothing?: None Help from another person to put on  and taking off regular lower body clothing?: None 6 Click Score: 24   End of Session Equipment Utilized During Treatment: Gait belt Nurse Communication: Mobility status  Activity Tolerance: Patient tolerated treatment well Patient left: in chair;with chair alarm set  OT Visit Diagnosis: Unsteadiness on feet (R26.81);Other abnormalities of gait and mobility (R26.89);Muscle weakness (generalized) (M62.81)                Time: 3643-8377 OT Time Calculation (min): 28 min Charges:  OT General Charges $OT Visit: 1 Visit OT Evaluation $OT Eval Low Complexity: 1 Low OT Treatments $Self Care/Home Management : 8-22 mins  Shanon Payor, OTD OTR/L  09/01/21, 1:22 PM

## 2021-09-01 NOTE — Evaluation (Signed)
Physical Therapy Evaluation Patient Details Name: Tyrone Moore MRN: [id number] DOB: 1951/10/19 Today's Date: 09/01/2021  History of Present Illness  presented to ER secondary to L UE/LE weakness and numbness; admitted for TIA/CVA work up.  MRI negative for acute infarct, but suspicious for clinical TIA/CVA remains.  Clinical Impression  Patient resting in bed upon arrival to session; alert and oriented, follows commands and agreeable to participation with session.  Denies pain at this time, and does note improvement in L UE function since admission.  Endorses persistent paresthesia L hand (digits 1-3); mild strength deficits remain (4+/5) throughout L hemi-body; mild/mod coordination deficits (UE > LE) noted.  Higher-level, dynamic balance deficits noted, evidenced by BERG 37/56.  Able to complete bed mobility with mod indep; sit/stand, basic transfers and gait (200') without assist device, cga/min assist.  Demonstrates reciprocal stepping pattern with good step height/length; limited L LE coordination with increased force of contact at initial contact; inconsistent L LE foot placement, worsened with head turns. Mild/mod gait deviations with head turns, but self-corrects (LE step strategy) without physical assist required. Educated on importance of minimizing activities requiring divided attention with gait, and on slow, focused performance of gait, functional activities to optimize coordination/control of L hemi-body.  Patient/son voiced understanding and agreement. Would benefit from skilled PT to address above deficits and promote optimal return to PLOF.; recommend discharge home with family support and outpatient therapy services upon discharge from acute hospitalization.          Recommendations for follow up therapy are one component of a multi-disciplinary discharge planning process, led by the attending physician.  Recommendations may be updated based on patient status, additional functional  criteria and insurance authorization.  Follow Up Recommendations Outpatient PT    Equipment Recommendations       Recommendations for Other Services       Precautions / Restrictions Precautions Precautions: Fall Restrictions Weight Bearing Restrictions: No      Mobility  Bed Mobility Overal bed mobility: Modified Independent                  Transfers Overall transfer level: Needs assistance Equipment used: None Transfers: Sit to/from Stand Sit to Stand: Min guard;Supervision         General transfer comment: good LE strength/control  Ambulation/Gait Ambulation/Gait assistance: Min guard Gait Distance (Feet): 200 Feet Assistive device: None       General Gait Details: reciprocal stepping pattern with good step height/length; limited L LE coordination with increased force of contact at initial contact; inconsistent L LE foot placement, worsened with head turns. Mild/mod gait deviations with head turns, but self-corrects (LE step strategy) without physical assist required  Stairs            Wheelchair Mobility    Modified Rankin (Stroke Patients Only)       Balance Overall balance assessment: Needs assistance Sitting-balance support: No upper extremity supported;Feet supported Sitting balance-Leahy Scale: Good     Standing balance support: No upper extremity supported Standing balance-Leahy Scale: Fair                   Standardized Balance Assessment Standardized Balance Assessment : Berg Balance Test Berg Balance Test Sit to Stand: Able to stand without using hands and stabilize independently Standing Unsupported: Able to stand safely 2 minutes Sitting with Back Unsupported but Feet Supported on Floor or Stool: Able to sit safely and securely 2 minutes Stand to Sit: Sits safely with minimal use of hands  Transfers: Able to transfer safely, minor use of hands Standing Unsupported with Eyes Closed: Able to stand 10 seconds with  supervision Standing Ubsupported with Feet Together: Able to place feet together independently but unable to hold for 30 seconds From Standing, Reach Forward with Outstretched Arm: Can reach confidently >25 cm (10") From Standing Position, Pick up Object from Floor: Able to pick up shoe, needs supervision From Standing Position, Turn to Look Behind Over each Shoulder: Turn sideways only but maintains balance Turn 360 Degrees: Needs close supervision or verbal cueing Standing Unsupported, Alternately Place Feet on Step/Stool: Needs assistance to keep from falling or unable to try Standing Unsupported, One Foot in Front: Able to take small step independently and hold 30 seconds Standing on One Leg: Unable to try or needs assist to prevent fall Total Score: 37         Pertinent Vitals/Pain Pain Assessment: No/denies pain    Home Living Family/patient expects to be discharged to:: Private residence Living Arrangements: Children Available Help at Discharge: Family Type of Home: House Home Access: Stairs to enter Entrance Stairs-Rails: Psychiatric nurse of Steps: 5 Home Layout: One level Home Equipment: None      Prior Function Level of Independence: Independent         Comments: Indep with ADLs, household and community mobilization; denies fall history; no home O2.     Hand Dominance   Dominant Hand: Right    Extremity/Trunk Assessment   Upper Extremity Assessment Upper Extremity Assessment:  (L UE grossly 4+/5, mod coordination deficits, dysmetria with finger to nose; mild sensory deficit L hand, digits 1-3)    Lower Extremity Assessment Lower Extremity Assessment:  (L LE grossly 4+/5 throughout, mild coordination deficits; denies sensory deficit)       Communication   Communication: No difficulties  Cognition Arousal/Alertness: Awake/alert Behavior During Therapy: WFL for tasks assessed/performed Overall Cognitive Status: Within Functional Limits  for tasks assessed                                        General Comments      Exercises Other Exercises Other Exercises: Reviewed role of PT and progressive mobility; importance/value of 'forced use' of L UE, need for focused attention on gait/functional task to maximize balance/safety.  Patient and son voiced understanding and agreement.   Assessment/Plan    PT Assessment Patient needs continued PT services  PT Problem List Decreased strength;Decreased range of motion;Decreased activity tolerance;Decreased balance;Decreased mobility;Decreased coordination;Decreased knowledge of use of DME;Decreased safety awareness;Decreased knowledge of precautions       PT Treatment Interventions DME instruction;Gait training;Stair training;Functional mobility training;Therapeutic activities;Balance training;Patient/family education;Therapeutic exercise;Neuromuscular re-education    PT Goals (Current goals can be found in the Care Plan section)  Acute Rehab PT Goals Patient Stated Goal: to go home! PT Goal Formulation: With patient Time For Goal Achievement: 09/15/21 Potential to Achieve Goals: Good    Frequency 7X/week   Barriers to discharge        Co-evaluation               AM-PAC PT "6 Clicks" Mobility  Outcome Measure Help needed turning from your back to your side while in a flat bed without using bedrails?: None Help needed moving from lying on your back to sitting on the side of a flat bed without using bedrails?: None Help needed moving to and from a bed  to a chair (including a wheelchair)?: None Help needed standing up from a chair using your arms (e.g., wheelchair or bedside chair)?: A Little Help needed to walk in hospital room?: A Little Help needed climbing 3-5 steps with a railing? : A Little 6 Click Score: 21    End of Session Equipment Utilized During Treatment: Gait belt Activity Tolerance: Patient tolerated treatment well Patient left: in  bed;with call bell/phone within reach;with family/visitor present Nurse Communication: Mobility status PT Visit Diagnosis: Hemiplegia and hemiparesis;Difficulty in walking, not elsewhere classified (R26.2) Hemiplegia - Right/Left: Left Hemiplegia - dominant/non-dominant: Non-dominant Hemiplegia - caused by: Cerebral infarction    Time: 7530-1040 PT Time Calculation (min) (ACUTE ONLY): 29 min   Charges:   PT Evaluation $PT Eval Moderate Complexity: 1 Mod PT Treatments $Therapeutic Activity: 8-22 mins       Shelah Heatley H. Owens Shark, PT, DPT, NCS 09/01/21, 10:34 AM [phone]

## 2021-09-01 NOTE — Discharge Summary (Signed)
Physician Discharge Summary  Tyrone Moore WNI:[id number] DOB: [birth year] DOA: 08/31/2021  PCP: Glean Hess, MD  Admit date: 08/31/2021 Discharge date: 09/01/2021  Admitted From: home Disposition:  home  Recommendations for Outpatient Follow-up:  Follow up with PCP in 1-2 weeks Please obtain BMP/CBC in one week Please follow up on Smoking Cessation efforts and offer assistance. 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 Blaine Hamper: "Tyrone Moore is a 70 y.o. male with medical history significant of CKD-IV, hypertension, hyperlipidemia, COPD, GERD, hypothyroidism, BPH, chronic back pain, tobacco abuse, who presents with left-sided numbness and weakness.   Patient states that his symptoms started at about 10 PM last night.  He initially noted left arm numbness and left facial numbness, then he noted left leg weakness.  He also had mild slurred speech per his son at the bedside. He has dizziness.  No vision loss or hearing loss.  Patient said that he has mild intermittent dry cough and mild shortness breath due to COPD, which has not changed.  No chest pain.  No fever or chills.  No nausea vomiting, diarrhea or abdominal pain.  No symptoms of UTI   ED Course: pt was found to have positive COVID PCR, worsening renal function, potassium of 5.4, WBC 6.4, INR 1.0, temperature normal, blood  pressure 179/93, heart rate 63, RR 18, oxygen saturation 99% on room air.  Pending chest x-ray.  CT of head is negative for acute intracranial abnormality.  MRI for brain has motion degradation, but is negative for acute infarction.  Patient is placed on MedSurg bed for observation   MRI brain: 1. Intermittently motion degraded exam, as described. 2. No evidence of acute intracranial abnormality. 3. Minimal chronic small vessel ischemic changes within the cerebral white matter. 4. Mild left frontal and bilateral ethmoid sinus mucosal thickening. 5. Trace fluid within the bilateral mastoid air cells.   MRA head:  1. Mildly motion degraded exam. 2. 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 3. Severe stenosis within the left posterior cerebral artery at the P2/P3 junction. 4. 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 5. 1-2 mm vascular protrusion arising from the pre-cavernous right internal carotid artery, which may reflect atherosclerotic lobulation or an additional small aneurysm.   MRA neck:  1. Moderately motion degraded exam. 2. Portions of the proximal common carotid and vertebral arteries are excluded from the field of view. Additionally, portions of the distal cervical internal carotid and cervical vertebral arteries are excluded from the field of view. 3. Within the limitations of motion degradation, there is no appreciable hemodynamically significant stenosis of the common carotid, cervical internal carotid or cervical vertebral arteries at the imaged levels."   ECHO:   1. Left ventricular ejection fraction, by estimation, is 60 to 65%. The left ventricle has normal function. The left  ventricle has no regional wall motion abnormalities. There is mild concentric left ventricular hypertrophy. Left ventricular diastolic parameters are consistent with Grade I diastolic  dysfunction (impaired relaxation).   2. Right ventricular systolic function is normal. The right ventricular size is normal.   3. The mitral valve is normal in structure. No evidence of mitral valve regurgitation. No evidence of mitral stenosis.   4. The aortic valve is normal in structure. Aortic valve regurgitation is not visualized. Mild aortic valve sclerosis is present, with no evidence of aortic valve stenosis.   5. The inferior vena cava is normal in size with greater than 50% respiratory variability, suggesting right atrial pressure of 3 mmHg.     Although MRI negative, brainstem stroke apparent on physical exam, per neurology secondary to small vessel risk factors.  Neurology recommendations at discharge --: - LDL increased from 70 in April to 129 this admission --resume home atorva 40 mg given this is in the setting of medication nonadherence, consider increase if LDL remains elevated above goal after resuming his statin - A1c at goal < 7.0% - Continue DAPT x 21 days, then aspirin monotherapy lifelong - longterm BP goal normotension, please resume home blood pressure medications in a stepwise fashion, start 1 tomorrow and then the second agent on PCP follow-up - Please prescribe nicotine patch that he has been getting here in the hospital on discharge   SLP - no swallowing issues PT - outpatient PT recommended OT - outpatient OT recommended    Discharge Instructions   Discharge Instructions     Call MD for:   Complete by: As directed    New or worsening neurologic symptoms like weakness / numbness / tingling on one side of the body, trouble speaking or swallowing, trouble with balance or walking, trouble understanding what others are saying to you.  Persistent dizziness or vision changes.   Call MD for:  difficulty breathing, headache or visual disturbances   Complete by: As directed    Call MD for:  extreme fatigue   Complete by: As directed    Call MD for:  persistant  nausea and vomiting   Complete by: As directed    Call MD for:  severe uncontrolled pain   Complete by: As directed    Call MD for:  temperature >100.4   Complete by: As directed    Diet - low sodium heart healthy   Complete by: As directed    Discharge instructions   Complete by: As directed    Take BOTH Aspirin and Plavix (clopidogrel) daily for the next 21 days. After that, continue taking just the Aspirin daily.  For Blood Pressure - you should keep taking Coreg (carvedilol) when you go home.  Hold off on lisinopril until you see your primary care provider.  Please schedule a visit with them within about 1 week.    I sent prescription for Nicotine patches to help you quit smoking.   This is one of the MOST IMPORTANT things you can do to reduce your risk of future strokes.   Discuss with your Primary Care Provider.  Sometimes medications help along with nicotine substitution like patches.   Increase activity slowly   Complete by: As directed       Allergies as of 09/01/2021   No Known Allergies      Medication List     STOP taking these medications    lisinopril 10 MG tablet Commonly known as: ZESTRIL  TAKE these medications    acetaminophen 500 MG tablet Commonly known as: TYLENOL Take 1,000 mg by mouth every 6 (six) hours as needed for moderate pain.   aspirin 81 MG EC tablet Take 1 tablet (81 mg total) by mouth daily. Swallow whole. Start taking on: September 02, 2021   atorvastatin 40 MG tablet Commonly known as: LIPITOR Take 1 tablet (40 mg total) by mouth daily.   carvedilol 25 MG tablet Commonly known as: COREG Take 25 mg by mouth 2 (two) times daily with a meal.   clopidogrel 75 MG tablet Commonly known as: PLAVIX Take 1 tablet (75 mg total) by mouth daily. Start taking on: September 02, 2021   D3-1000 25 MCG (1000 UT) capsule Generic drug: Cholecalciferol Take 2,000 Units by mouth daily.   levothyroxine 112 MCG tablet Commonly known as:  Euthyrox Take 1 tablet (112 mcg total) by mouth daily before breakfast.   MAGNESIUM PO Take 1,000 mg by mouth daily.   MULTIVITAL-M PO Take 1 tablet by mouth daily.   nicotine 21 mg/24hr patch Commonly known as: NICODERM CQ - dosed in mg/24 hours Place 1 patch (21 mg total) onto the skin daily. Start taking on: September 02, 2021   omeprazole 20 MG capsule Commonly known as: PRILOSEC Take 20 mg by mouth daily.   tamsulosin 0.4 MG Caps capsule Commonly known as: FLOMAX Take 0.8 mg by mouth daily after supper.        No Known Allergies   If you experience worsening of your admission symptoms, develop shortness of breath, life threatening emergency, suicidal or homicidal thoughts you must seek medical attention immediately by calling 911 or calling your MD immediately  if symptoms less severe.    Please note   You were cared for by a hospitalist during your hospital stay. If you have any questions about your discharge medications or the care you received while you were in the hospital after you are discharged, you can call the unit and asked to speak with the hospitalist on call if the hospitalist that took care of you is not available. Once you are discharged, your primary care physician will handle any further medical issues. Please note that NO REFILLS for any discharge medications will be authorized once you are discharged, as it is imperative that you return to your primary care physician (or establish a relationship with a primary care physician if you do not have one) for your aftercare needs so that they can reassess your need for medications and monitor your lab values.   Consultations: Neurology    Procedures/Studies: MR ANGIO HEAD WO CONTRAST  Result Date: 08/31/2021 CLINICAL DATA:  Neuro deficit, acute, stroke suspected. Additional history provided: Patient reports dizziness with arm/face numbness. EXAM: MRI HEAD WITHOUT CONTRAST MRA HEAD WITHOUT CONTRAST MRA NECK  WITHOUT CONTRAST TECHNIQUE: Multiplanar, multi-echo pulse sequences of the brain and surrounding structures were acquired without intravenous contrast. Angiographic images of the Circle of Willis were acquired using MRA technique without intravenous contrast. Angiographic images of the neck were acquired using MRA technique without intravenous contrast. Carotid stenosis measurements (when applicable) are obtained utilizing NASCET criteria, using the distal internal carotid diameter as the denominator. COMPARISON:  Noncontrast head CT 08/31/2021. FINDINGS: MRI HEAD FINDINGS Brain: Intermittently motion degraded exam. Most notably, there is moderate motion degradation of the axial T2 TSE sequence. Cerebral volume is normal for age. Minimal multifocal T2 FLAIR hyperintense signal abnormality within the cerebral white matter, nonspecific but compatible with chronic small vessel  ischemic disease. There is no acute infarct. No evidence of an intracranial mass. No chronic intracranial blood products. No extra-axial fluid collection. No midline shift. Vascular: Maintained flow voids within the proximal large arterial vessels. Skull and upper cervical spine: No focal suspicious marrow lesion. Incompletely assessed cervical spondylosis. Sinuses/Orbits: Visualized orbits show no acute finding. Mild scattered mucosal thickening within the left frontal and bilateral ethmoid sinuses at the imaged levels. Other: Trace fluid within the bilateral mastoid air cells. MRA HEAD FINDINGS Mildly motion degraded exam. Anterior circulation: The intracranial internal carotid arteries are patent. The M1 middle cerebral arteries are patent. No M2 proximal branch occlusion or high-grade proximal stenosis is identified. The anterior cerebral arteries are patent. 1-2 mm vascular protrusion arising from the pre cavernous right internal carotid artery, which which may reflect atherosclerotic irregularity or a small aneurysm (series 1033, image 14).  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 Posterior circulation: The intracranial vertebral arteries are patent. The basilar artery is patent. The posterior cerebral arteries are patent. 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 Severe stenosis within left posterior cerebral artery at the P2/P3 junction. Posterior communicating arteries are diminutive or absent bilaterally. Anatomic variants: As described. MRA NECK FINDINGS Moderately motion degraded examination. The proximal common carotid arteries and proximal vertebral arteries are excluded from the field of view. The distal cervical internal carotid arteries and cervical vertebral arteries are excluded from the field of view. The visualized common carotid, internal carotid and vertebral arteries are patent within the neck. Within the limitations of motion degradation, there is no appreciable hemodynamically significant stenosis (50% or greater) within these vessels at the imaged levels. IMPRESSION: MRI brain: 1. Intermittently motion degraded exam, as described. 2. No evidence of acute intracranial abnormality. 3. Minimal chronic small vessel ischemic changes within the cerebral white matter. 4. Mild left frontal and bilateral ethmoid sinus mucosal thickening. 5. Trace fluid within the bilateral mastoid air cells. MRA head: 1. Mildly motion degraded exam. 2. 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 3. Severe stenosis within the left posterior cerebral artery at the P2/P3 junction. 4. 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 5. 1-2 mm vascular protrusion arising from the pre-cavernous right internal carotid artery, which may reflect atherosclerotic lobulation or an additional small aneurysm.  MRA neck: 1. Moderately motion degraded exam. 2. Portions of the proximal common carotid and vertebral arteries are excluded from the field of view. Additionally, portions of the distal cervical internal carotid and cervical vertebral arteries are excluded from the field of view. 3. Within the limitations of motion degradation, there is no appreciable hemodynamically significant stenosis of the common carotid, cervical internal carotid or cervical vertebral arteries at the imaged levels. Electronically Signed   By: Kellie Simmering D.O.   On: 08/31/2021 11:19   MR ANGIO NECK WO CONTRAST  Result Date: 08/31/2021 CLINICAL DATA:  Neuro deficit, acute, stroke suspected. Additional history provided: Patient reports dizziness with arm/face numbness. EXAM: MRI HEAD WITHOUT CONTRAST MRA HEAD WITHOUT CONTRAST MRA NECK WITHOUT CONTRAST TECHNIQUE: Multiplanar, multi-echo pulse sequences of the brain and surrounding structures were acquired without intravenous contrast. Angiographic images of the Circle of Willis were acquired using MRA technique without intravenous contrast. Angiographic images of the neck were acquired using MRA technique without intravenous contrast. Carotid stenosis measurements (when  applicable) are obtained utilizing NASCET criteria, using the distal internal carotid diameter as the denominator. COMPARISON:  Noncontrast head CT 08/31/2021. FINDINGS: MRI HEAD FINDINGS Brain: Intermittently motion degraded exam. Most notably, there is moderate motion degradation of the axial T2 TSE sequence. Cerebral volume is normal for age. Minimal multifocal T2 FLAIR hyperintense signal abnormality within the cerebral white matter, nonspecific but compatible with chronic small vessel ischemic disease. There is no acute infarct. No evidence of an intracranial mass. No chronic intracranial blood products. No extra-axial fluid collection. No midline shift. Vascular: Maintained flow voids within the proximal large arterial  vessels. Skull and upper cervical spine: No focal suspicious marrow lesion. Incompletely assessed cervical spondylosis. Sinuses/Orbits: Visualized orbits show no acute finding. Mild scattered mucosal thickening within the left frontal and bilateral ethmoid sinuses at the imaged levels. Other: Trace fluid within the bilateral mastoid air cells. MRA HEAD FINDINGS Mildly motion degraded exam. Anterior circulation: The intracranial internal carotid arteries are patent. The M1 middle cerebral arteries are patent. No M2 proximal branch occlusion or high-grade proximal stenosis is identified. The anterior cerebral arteries are patent. 1-2 mm vascular protrusion arising from the pre cavernous right internal carotid artery, which which may reflect atherosclerotic irregularity or a small aneurysm (series 1033, image 14). 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 Posterior circulation: The intracranial vertebral arteries are patent. The basilar artery is patent. The posterior cerebral arteries are patent. 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 Severe stenosis within left posterior cerebral artery at the P2/P3 junction. Posterior communicating arteries are diminutive or absent bilaterally. Anatomic variants: As described. MRA NECK FINDINGS Moderately motion degraded examination. The proximal common carotid arteries and proximal vertebral arteries are excluded from the field of view. The distal cervical internal carotid arteries and cervical vertebral arteries are excluded from the field of view. The visualized common carotid, internal carotid and vertebral arteries are patent within the neck. Within the limitations of motion degradation, there is no appreciable hemodynamically significant stenosis (50% or greater) within these vessels at the imaged levels.  IMPRESSION: MRI brain: 1. Intermittently motion degraded exam, as described. 2. No evidence of acute intracranial abnormality. 3. Minimal chronic small vessel ischemic changes within the cerebral white matter. 4. Mild left frontal and bilateral ethmoid sinus mucosal thickening. 5. Trace fluid within the bilateral mastoid air cells. MRA head: 1. Mildly motion degraded exam. 2. 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 3. Severe stenosis within the left posterior cerebral artery at the P2/P3 junction. 4. 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 5. 1-2 mm vascular protrusion arising from the pre-cavernous right internal carotid artery, which may reflect atherosclerotic lobulation or an additional small aneurysm. MRA neck: 1. Moderately motion degraded exam. 2. Portions of the proximal common carotid and vertebral arteries are excluded from the field of view. Additionally, portions of the distal cervical internal carotid and cervical vertebral arteries are excluded from the field of view. 3. Within the limitations of motion degradation, there is no appreciable hemodynamically significant stenosis of the common carotid, cervical internal carotid or cervical vertebral arteries at the imaged levels. Electronically Signed   By: Kellie Simmering D.O.   On: 08/31/2021 11:19   MR BRAIN WO CONTRAST  Result Date: 08/31/2021 CLINICAL DATA:  Neuro deficit, acute, stroke suspected. Additional history provided:  Patient reports dizziness with arm/face numbness. EXAM: MRI HEAD WITHOUT CONTRAST MRA HEAD WITHOUT CONTRAST MRA NECK WITHOUT CONTRAST TECHNIQUE: Multiplanar, multi-echo pulse sequences of the brain and surrounding structures were acquired without intravenous contrast. Angiographic images of the Circle of Willis were acquired using MRA technique without intravenous contrast. Angiographic images of the neck were  acquired using MRA technique without intravenous contrast. Carotid stenosis measurements (when applicable) are obtained utilizing NASCET criteria, using the distal internal carotid diameter as the denominator. COMPARISON:  Noncontrast head CT 08/31/2021. FINDINGS: MRI HEAD FINDINGS Brain: Intermittently motion degraded exam. Most notably, there is moderate motion degradation of the axial T2 TSE sequence. Cerebral volume is normal for age. Minimal multifocal T2 FLAIR hyperintense signal abnormality within the cerebral white matter, nonspecific but compatible with chronic small vessel ischemic disease. There is no acute infarct. No evidence of an intracranial mass. No chronic intracranial blood products. No extra-axial fluid collection. No midline shift. Vascular: Maintained flow voids within the proximal large arterial vessels. Skull and upper cervical spine: No focal suspicious marrow lesion. Incompletely assessed cervical spondylosis. Sinuses/Orbits: Visualized orbits show no acute finding. Mild scattered mucosal thickening within the left frontal and bilateral ethmoid sinuses at the imaged levels. Other: Trace fluid within the bilateral mastoid air cells. MRA HEAD FINDINGS Mildly motion degraded exam. Anterior circulation: The intracranial internal carotid arteries are patent. The M1 middle cerebral arteries are patent. No M2 proximal branch occlusion or high-grade proximal stenosis is identified. The anterior cerebral arteries are patent. 1-2 mm vascular protrusion arising from the pre cavernous right internal carotid artery, which which may reflect atherosclerotic irregularity or a small aneurysm (series 1033, image 14). 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 Posterior circulation: The intracranial vertebral arteries are patent. The basilar artery is patent. The posterior cerebral arteries are  patent. 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 Severe stenosis within left posterior cerebral artery at the P2/P3 junction. Posterior communicating arteries are diminutive or absent bilaterally. Anatomic variants: As described. MRA NECK FINDINGS Moderately motion degraded examination. The proximal common carotid arteries and proximal vertebral arteries are excluded from the field of view. The distal cervical internal carotid arteries and cervical vertebral arteries are excluded from the field of view. The visualized common carotid, internal carotid and vertebral arteries are patent within the neck. Within the limitations of motion degradation, there is no appreciable hemodynamically significant stenosis (50% or greater) within these vessels at the imaged levels. IMPRESSION: MRI brain: 1. Intermittently motion degraded exam, as described. 2. No evidence of acute intracranial abnormality. 3. Minimal chronic small vessel ischemic changes within the cerebral white matter. 4. Mild left frontal and bilateral ethmoid sinus mucosal thickening. 5. Trace fluid within the bilateral mastoid air cells. MRA head: 1. Mildly motion degraded exam. 2. 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 3. Severe stenosis within the left posterior cerebral artery at the P2/P3 junction. 4. 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 5. 1-2 mm vascular protrusion arising from the pre-cavernous right internal carotid artery, which may reflect atherosclerotic lobulation or an additional small aneurysm. MRA neck: 1. Moderately motion degraded exam. 2. Portions of the proximal common carotid and vertebral arteries are excluded from the field of view. Additionally, portions of the distal cervical internal carotid and cervical vertebral arteries are excluded from the  field of view. 3. Within  the limitations of motion degradation, there is no appreciable hemodynamically significant stenosis of the common carotid, cervical internal carotid or cervical vertebral arteries at the imaged levels. Electronically Signed   By: Kellie Simmering D.O.   On: 08/31/2021 11:19   DG Chest Port 1 View  Result Date: 08/31/2021 CLINICAL DATA:  A 70 year old COVID positive male presents for weaknessw. EXAM: PORTABLE CHEST 1 VIEW COMPARISON:  None FINDINGS: EKG leads project over the chest. Trachea is midline. Cardiomediastinal contours and hilar structures are normal. Lungs are clear. No pneumothorax or sign of pleural effusion on frontal radiograph. On limited assessment no acute skeletal process. IMPRESSION: No acute cardiopulmonary disease. Electronically Signed   By: Zetta Bills M.D.   On: 08/31/2021 14:58   ECHOCARDIOGRAM COMPLETE  Result Date: 08/31/2021    ECHOCARDIOGRAM REPORT   Patient Name:   Tyrone Moore Date of Exam: 08/31/2021 Medical Rec #:  [id number]     Height:       71.0 in Accession #:    [id number]    Weight:       210.0 lb Date of Birth:  01-15-1951     BSA:          2.153 m Patient Age:    52 years      BP:           179/93 mmHg Patient Gender: M             HR:           63 bpm. Exam Location:  [facility] Procedure: 2D Echo, Cardiac Doppler and Color Doppler Indications:     Stroke I63.9  History:         Patient has no prior history of Echocardiogram examinations.                  COPD; Risk Factors:Hypertension. CKD stage 4.  Sonographer:     Sherrie Sport Referring Phys:  [id number] Lorenza Chick Diagnosing Phys: Neoma Laming  Sonographer Comments: No parasternal window and Technically challenging study due to limited acoustic windows. IMPRESSIONS  1. Left ventricular ejection fraction, by estimation, is 60 to 65%. The left ventricle has normal function. The left ventricle has no regional wall motion abnormalities. There is mild concentric left ventricular hypertrophy. Left ventricular diastolic  parameters are consistent with Grade I diastolic dysfunction (impaired relaxation).  2. Right ventricular systolic function is normal. The right ventricular size is normal.  3. The mitral valve is normal in structure. No evidence of mitral valve regurgitation. No evidence of mitral stenosis.  4. The aortic valve is normal in structure. Aortic valve regurgitation is not visualized. Mild aortic valve sclerosis is present, with no evidence of aortic valve stenosis.  5. The inferior vena cava is normal in size with greater than 50% respiratory variability, suggesting right atrial pressure of 3 mmHg. FINDINGS  Left Ventricle: Left ventricular ejection fraction, by estimation, is 60 to 65%. The left ventricle has normal function. The left ventricle has no regional wall motion abnormalities. The left ventricular internal cavity size was normal in size. There is  mild concentric left ventricular hypertrophy. Left ventricular diastolic parameters are consistent with Grade I diastolic dysfunction (impaired relaxation). Right Ventricle: The right ventricular size is normal. No increase in right ventricular wall thickness. Right ventricular systolic function is normal. Left Atrium: Left atrial size was normal in size. Right Atrium: Right atrial size was normal in size. Pericardium: There is no  evidence of pericardial effusion. Mitral Valve: The mitral valve is normal in structure. No evidence of mitral valve regurgitation. No evidence of mitral valve stenosis. Tricuspid Valve: The tricuspid valve is normal in structure. Tricuspid valve regurgitation is not demonstrated. No evidence of tricuspid stenosis. Aortic Valve: The aortic valve is normal in structure. Aortic valve regurgitation is not visualized. Mild aortic valve sclerosis is present, with no evidence of aortic valve stenosis. Aortic valve mean gradient measures 5.0 mmHg. Aortic valve peak gradient measures 8.9 mmHg. Aortic valve area, by VTI measures 1.97 cm. Pulmonic  Valve: The pulmonic valve was normal in structure. Pulmonic valve regurgitation is not visualized. No evidence of pulmonic stenosis. Aorta: The aortic root is normal in size and structure. Venous: The inferior vena cava is normal in size with greater than 50% respiratory variability, suggesting right atrial pressure of 3 mmHg. IAS/Shunts: No atrial level shunt detected by color flow Doppler.  LEFT VENTRICLE PLAX 2D LVIDd:         4.47 cm   Diastology LVIDs:         2.96 cm   LV e' medial:    4.24 cm/s LV PW:         1.13 cm   LV E/e' medial:  19.3 LV IVS:        1.06 cm   LV e' lateral:   4.35 cm/s LVOT diam:     2.00 cm   LV E/e' lateral: 18.8 LV SV:         56 LV SV Index:   26 LVOT Area:     3.14 cm  RIGHT VENTRICLE RV Basal diam:  3.38 cm LEFT ATRIUM           Index        RIGHT ATRIUM           Index LA diam:      3.10 cm 1.44 cm/m   RA Area:     15.00 cm LA Vol (A4C): 26.7 ml 12.40 ml/m  RA Volume:   34.40 ml  15.98 ml/m  AORTIC VALVE AV Area (Vmax):    1.83 cm AV Area (Vmean):   1.86 cm AV Area (VTI):     1.97 cm AV Vmax:           149.00 cm/s AV Vmean:          101.350 cm/s AV VTI:            0.286 m AV Peak Grad:      8.9 mmHg AV Mean Grad:      5.0 mmHg LVOT Vmax:         86.80 cm/s LVOT Vmean:        60.000 cm/s LVOT VTI:          0.179 m LVOT/AV VTI ratio: 0.63  AORTA Ao Root diam: 2.70 cm MITRAL VALVE               TRICUSPID VALVE MV Area (PHT): 2.44 cm    TR Peak grad:   17.6 mmHg MV Decel Time: 311 msec    TR Vmax:        210.00 cm/s MV E velocity: 81.80 cm/s MV A velocity: 90.40 cm/s  SHUNTS MV E/A ratio:  0.90        Systemic VTI:  0.18 m  Systemic Diam: 2.00 cm Neoma Laming Electronically signed by Neoma Laming Signature Date/Time: 08/31/2021/2:18:48 PM    Final    CT HEAD CODE STROKE WO CONTRAST  Result Date: 08/31/2021 CLINICAL DATA:  Code stroke. Acute neuro deficit. Left-sided weakness and dizziness EXAM: CT HEAD WITHOUT CONTRAST TECHNIQUE: Contiguous  axial images were obtained from the base of the skull through the vertex without intravenous contrast. COMPARISON:  None. FINDINGS: Brain: No evidence of acute infarction, hemorrhage, hydrocephalus, extra-axial collection or mass lesion/mass effect. Vascular: Negative for hyperdense vessel Skull: Negative Sinuses/Orbits: Negative Other: None ASPECTS ([place] Stroke Program Early CT Score) - Ganglionic level infarction (caudate, lentiform nuclei, internal capsule, insula, M1-M3 cortex): 7 - Supraganglionic infarction (M4-M6 cortex): 3 Total score (0-10 with 10 being normal): 10 IMPRESSION: 1. Negative CT head 2. ASPECTS is 10 Code stroke imaging results were communicated on 08/31/2021 at 9:55 am to provider Bhagat via text page Electronically Signed   By: Franchot Gallo M.D.   On: 08/31/2021 09:57       Subjective: Pt seen up in recliner this AM.  He reports feeling well, some improvement in symptoms. Denies any new or worsening symptoms.  Eager to return home.  No other acute complaints.    Discharge Exam: Vitals:   09/01/21 0452 09/01/21 0754  BP: (!) 162/82 (!) 162/75  Pulse: 66 70  Resp: 16 19  Temp: (!) 97.4 F (36.3 C) (!) 97.5 F (36.4 C)  SpO2: 95% 100%   Vitals:   08/31/21 2127 09/01/21 0000 09/01/21 0452 09/01/21 0754  BP: (!) 175/83 (!) 166/82 (!) 162/82 (!) 162/75  Pulse: 68 65 66 70  Resp: 14 16 16 19   Temp: 98.1 F (36.7 C) 97.8 F (36.6 C) (!) 97.4 F (36.3 C) (!) 97.5 F (36.4 C)  TempSrc: Oral Oral Oral Oral  SpO2: 98% 96% 95% 100%  Weight:      Height:        General: Pt is alert, awake, not in acute distress Cardiovascular: RRR, S1/S2 +, no rubs, no gallops Respiratory: CTA bilaterally, no wheezing, no rhonchi Abdominal: Soft, NT, ND, bowel sounds + Extremities: no edema, no cyanosis    The results of significant diagnostics from this hospitalization (including imaging, microbiology, ancillary and laboratory) are listed below for reference.      Microbiology: Recent Results (from the past 240 hour(s))  Resp Panel by RT-PCR (Flu A&B, Covid) Nasopharyngeal Swab     Status: Abnormal   Collection Time: 08/31/21 11:01 AM   Specimen: Nasopharyngeal Swab; Nasopharyngeal(NP) swabs in vial transport medium  Result Value Ref Range Status   SARS Coronavirus 2 by RT PCR POSITIVE (A) NEGATIVE Final    Comment: RESULT CALLED TO, READ BACK BY AND VERIFIED WITH: Fitzpatrick ZANDESS 08/31/21 1232 KLW (NOTE) SARS-CoV-2 target nucleic acids are DETECTED.  The SARS-CoV-2 RNA is generally detectable in upper respiratory specimens during the acute phase of infection. Positive results are indicative of the presence of the identified virus, but do not rule out bacterial infection or co-infection with other pathogens not detected by the test. Clinical correlation with patient history and other diagnostic information is necessary to determine patient infection status. The expected result is Negative.  Fact Sheet for Patients: EntrepreneurPulse.com.au  Fact Sheet for Healthcare Providers: IncredibleEmployment.be  This test is not yet approved or cleared by the [place] FDA and  has been authorized for detection and/or diagnosis of SARS-CoV-2 by FDA under an Emergency Use Authorization (EUA).  This EUA will remain in  effect (meaning this test can be [place] ed) for the duration of  the COVID-19 declaration under Section 564(b)(1) of the Act, 21 U.S.C. section 360bbb-3(b)(1), unless the authorization is terminated or revoked sooner.     Influenza A by PCR NEGATIVE NEGATIVE Final   Influenza B by PCR NEGATIVE NEGATIVE Final    Comment: (NOTE) The Xpert Xpress SARS-CoV-2/FLU/RSV plus assay is intended as an aid in the diagnosis of influenza from Nasopharyngeal swab specimens and should not be used as a sole basis for treatment. Nasal washings and aspirates are unacceptable for Xpert Xpress  SARS-CoV-2/FLU/RSV testing.  Fact Sheet for Patients: EntrepreneurPulse.com.au  Fact Sheet for Healthcare Providers: IncredibleEmployment.be  This test is not yet approved or cleared by the [place] FDA and has been authorized for detection and/or diagnosis of SARS-CoV-2 by FDA under an Emergency Use Authorization (EUA). This EUA will remain in effect (meaning this test can be used) for the duration of the COVID-19 declaration under Section 564(b)(1) of the Act, 21 U.S.C. section 360bbb-3(b)(1), unless the authorization is terminated or revoked.  Performed at [facility], [address] [place], [place] [address]      Labs: BNP (last 3 results) No results for input(s): BNP in the last 8760 hours. Basic Metabolic Panel: Recent Labs  Lab 08/31/21 0955 08/31/21 0957 09/01/21 0454  NA 141  --  141  K 5.4*  --  4.9  CL 112*  --  113*  CO2 24  --  23  GLUCOSE 85  --  98  BUN 43*  --  42*  CREATININE 3.99* 4.20* 3.53*  CALCIUM 8.9  --  8.7*   Liver Function Tests: Recent Labs  Lab 08/31/21 0955  AST 19  ALT 11  ALKPHOS 98  BILITOT 0.8  PROT 7.1  ALBUMIN 3.6   No results for input(s): LIPASE, AMYLASE in the last 168 hours. No results for input(s): AMMONIA in the last 168 hours. CBC: Recent Labs  Lab 08/31/21 0955  WBC 6.4  NEUTROABS 3.6  HGB 12.2*  HCT 36.1*  MCV 100.0  PLT 173   Cardiac Enzymes: No results for input(s): CKTOTAL, CKMB, CKMBINDEX, TROPONINI in the last 168 hours. BNP: Invalid input(s): POCBNP CBG: Recent Labs  Lab 08/31/21 0937  GLUCAP 92   D-Dimer No results for input(s): DDIMER in the last 72 hours. Hgb A1c Recent Labs    08/31/21 1007  HGBA1C 6.0*   Lipid Profile Recent Labs    09/01/21 0454  CHOL 272*  HDL 34*  LDLCALC 197*  TRIG 206*  CHOLHDL 8.0   Thyroid function studies Recent Labs    08/31/21 1007  TSH 28.489*   Anemia work up No results for input(s):  VITAMINB12, FOLATE, FERRITIN, TIBC, IRON, RETICCTPCT in the last 72 hours. Urinalysis    Component Value Date/Time   COLORURINE YELLOW 06/29/2021 1614   APPEARANCEUR Cloudy (A) 07/10/2021 1345   LABSPEC 1.020 06/29/2021 1614   PHURINE 5.5 06/29/2021 1614   GLUCOSEU Negative 07/10/2021 1345   HGBUR TRACE (A) 06/29/2021 1614   BILIRUBINUR Negative 07/10/2021 [place] 06/29/2021 1614   PROTEINUR 3+ (A) 07/10/2021 1345   PROTEINUR 100 (A) 06/29/2021 1614   UROBILINOGEN 0.2 03/26/2021 1433   NITRITE Negative 07/10/2021 1345   NITRITE NEGATIVE 06/29/2021 1614   LEUKOCYTESUR 1+ (A) 07/10/2021 1345   LEUKOCYTESUR NEGATIVE 06/29/2021 1614   Sepsis Labs Invalid input(s): PROCALCITONIN,  WBC,  LACTICIDVEN Microbiology Recent Results (from the past 240 hour(s))  Resp Panel by RT-PCR (Flu A&B, Covid) Nasopharyngeal Swab     Status: Abnormal   Collection Time: 08/31/21 11:01 AM   Specimen: Nasopharyngeal Swab; Nasopharyngeal(NP) swabs in vial transport medium  Result Value Ref Range Status   SARS Coronavirus 2 by RT PCR POSITIVE (A) NEGATIVE Final    Comment: RESULT CALLED TO, READ BACK BY AND VERIFIED WITH: Jourdan ZANDESS 08/31/21 1232 KLW (NOTE) SARS-CoV-2 target nucleic acids are DETECTED.  The SARS-CoV-2 RNA is generally detectable in upper respiratory specimens during the acute phase of infection. Positive results are indicative of the presence of the identified virus, but do not rule out bacterial infection or co-infection with other pathogens not detected by the test. Clinical correlation with patient history and other diagnostic information is necessary to determine patient infection status. The expected result is Negative.  Fact Sheet for Patients: EntrepreneurPulse.com.au  Fact Sheet for Healthcare Providers: IncredibleEmployment.be  This test is not yet approved or cleared by the [place] FDA and  has been authorized  for detection and/or diagnosis of SARS-CoV-2 by FDA under an Emergency Use Authorization (EUA).  This EUA will remain in effect (meaning this test can be [place] ed) for the duration of  the COVID-19 declaration under Section 564(b)(1) of the Act, 21 U.S.C. section 360bbb-3(b)(1), unless the authorization is terminated or revoked sooner.     Influenza A by PCR NEGATIVE NEGATIVE Final   Influenza B by PCR NEGATIVE NEGATIVE Final    Comment: (NOTE) The Xpert Xpress SARS-CoV-2/FLU/RSV plus assay is intended as an aid in the diagnosis of influenza from Nasopharyngeal swab specimens and should not be used as a sole basis for treatment. Nasal washings and aspirates are unacceptable for Xpert Xpress SARS-CoV-2/FLU/RSV testing.  Fact Sheet for Patients: EntrepreneurPulse.com.au  Fact Sheet for Healthcare Providers: IncredibleEmployment.be  This test is not yet approved or cleared by the [place] FDA and has been authorized for detection and/or diagnosis of SARS-CoV-2 by FDA under an Emergency Use Authorization (EUA). This EUA will remain in effect (meaning this test can be used) for the duration of the COVID-19 declaration under Section 564(b)(1) of the Act, 21 U.S.C. section 360bbb-3(b)(1), unless the authorization is terminated or revoked.  Performed at [facility], [address] [place], [place] [address]      Time coordinating discharge: Over 30 minutes  SIGNED:   Ezekiel Slocumb, DO [facility] 09/01/2021, 12:12 PM   If 7PM-7AM, please contact night-coverage [email or web address]

## 2021-09-05 ENCOUNTER — Ambulatory Visit (INDEPENDENT_AMBULATORY_CARE_PROVIDER_SITE_OTHER): Payer: Medicare Other | Admitting: Internal Medicine

## 2021-09-05 ENCOUNTER — Encounter: Payer: Self-pay | Admitting: Internal Medicine

## 2021-09-05 ENCOUNTER — Other Ambulatory Visit: Payer: Self-pay

## 2021-09-05 VITALS — BP 144/86 | HR 69 | Temp 97.9°F | Ht 71.0 in | Wt 190.0 lb

## 2021-09-05 DIAGNOSIS — K219 Gastro-esophageal reflux disease without esophagitis: Secondary | ICD-10-CM

## 2021-09-05 DIAGNOSIS — M545 Low back pain, unspecified: Secondary | ICD-10-CM | POA: Diagnosis not present

## 2021-09-05 DIAGNOSIS — I639 Cerebral infarction, unspecified: Secondary | ICD-10-CM

## 2021-09-05 DIAGNOSIS — I10 Essential (primary) hypertension: Secondary | ICD-10-CM

## 2021-09-05 DIAGNOSIS — F17200 Nicotine dependence, unspecified, uncomplicated: Secondary | ICD-10-CM | POA: Diagnosis not present

## 2021-09-05 DIAGNOSIS — G8929 Other chronic pain: Secondary | ICD-10-CM

## 2021-09-05 DIAGNOSIS — E782 Mixed hyperlipidemia: Secondary | ICD-10-CM | POA: Diagnosis not present

## 2021-09-05 DIAGNOSIS — N184 Chronic kidney disease, stage 4 (severe): Secondary | ICD-10-CM | POA: Diagnosis not present

## 2021-09-05 DIAGNOSIS — N2581 Secondary hyperparathyroidism of renal origin: Secondary | ICD-10-CM | POA: Diagnosis not present

## 2021-09-05 MED ORDER — OMEPRAZOLE 20 MG PO CPDR: 20.0000 mg | DELAYED_RELEASE_CAPSULE | Freq: Every day | ORAL | 5 refills | Status: DC

## 2021-09-05 MED ORDER — CARVEDILOL 25 MG PO TABS: 25.0000 mg | ORAL_TABLET | Freq: Two times a day (BID) | ORAL | 5 refills | Status: DC

## 2021-09-05 MED ORDER — ROSUVASTATIN CALCIUM 5 MG PO TABS: 5.0000 mg | ORAL_TABLET | Freq: Every day | ORAL | 1 refills | Status: DC

## 2021-09-05 NOTE — Progress Notes (Signed)
Date:  09/05/2021   Name:  Tyrone Moore   DOB:  1951/01/26   MRN:  [id number]   Chief Complaint: Hospitalization Follow-up (Admitted 08/31/21 and discharged 09/01/21; CVA, TIA, Covid positive) Hospital follow up. Admitted to [facility] 08/31/21 to 09/01/21 ED observation/admission.  Summary of HPI and Hospital Course:  MRI negative brainstem stroke secondary to small vessel risk factors Neurology recommendations at discharge -- Recommendations: - LDL increased from 70 in April to 129 this admission --resume home atorva 40 mg given this is in the setting of medication nonadherence, consider increase if LDL remains elevated above goal after resuming his statin - A1c at goal < 7.0% - Continue DAPT x 21 days, then aspirin monotherapy lifelong - longterm BP goal normotension, please resume home blood pressure medications in a stepwise fashion, start 1 tomorrow and then the second agent on PCP follow-up - Please prescribe nicotine patch that he has been getting here in the hospital on discharge   Recommendations for Outpatient Follow-up:  Follow up with PCP in 1-2 weeks Please obtain BMP/CBC in one week Please follow up on Smoking Cessation efforts and offer assistance. Check magnesium level given on daily supplementation   Home Health: None - outpatient PT  Equipment/Devices: none    MRI brain: 1. Intermittently motion degraded exam, as described. 2. No evidence of acute intracranial abnormality. 3. Minimal chronic small vessel ischemic changes within the cerebral white matter. 4. Mild left frontal and bilateral ethmoid sinus mucosal thickening. 5. Trace fluid within the bilateral mastoid air cells.   MRA head: 1. Mildly motion degraded exam. 2. 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 3. Severe stenosis within the left posterior cerebral artery at the P2/P3 junction. 4. 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 5. 1-2 mm vascular protrusion arising from the pre-cavernous right internal carotid artery, which may reflect atherosclerotic lobulation or an additional small aneurysm.   MRA neck: 1. Moderately motion degraded exam. 2. Portions of the proximal common carotid and vertebral arteries are excluded from the field of view. Additionally, portions of the distal cervical internal carotid and cervical vertebral arteries are excluded from the field of view. 3. Within the limitations of motion degradation, there is no appreciable hemodynamically significant stenosis of the common carotid, cervical internal carotid or cervical vertebral arteries at the imaged levels.   HPI  Lab Results  Component Value Date   CREATININE 3.53 (H) 09/01/2021   BUN 42 (H) 09/01/2021   NA 141 09/01/2021   K 4.9 09/01/2021   CL 113 (H) 09/01/2021   CO2 23 09/01/2021   Lab Results  Component Value Date   CHOL 272 (H) 09/01/2021   HDL 34 (L) 09/01/2021   LDLCALC 197 (H) 09/01/2021   TRIG 206 (H) 09/01/2021   CHOLHDL 8.0 09/01/2021   Lab Results  Component Value Date   TSH 28.489 (H) 08/31/2021   Lab Results  Component Value Date   HGBA1C 6.0 (H) 08/31/2021   Lab Results  Component Value Date   WBC 6.4 08/31/2021   HGB 12.2 (L) 08/31/2021   HCT 36.1 (L) 08/31/2021   MCV 100.0 08/31/2021   PLT 173 08/31/2021   Lab Results  Component Value Date   ALT 11 08/31/2021   AST 19 08/31/2021   ALKPHOS 98 08/31/2021   BILITOT 0.8 08/31/2021     Review of Systems  Constitutional:  Negative for chills, fatigue and  fever.  Respiratory:  Positive for cough. Negative for chest tightness and wheezing.   Cardiovascular:  Negative for chest pain, palpitations and leg swelling.  Gastrointestinal:  Negative for abdominal pain, constipation and diarrhea.  Musculoskeletal:  Positive for gait problem (from back and hip pain).  Neurological:  Positive for  weakness and numbness (slight tingling in left hand). Negative for dizziness, facial asymmetry, light-headedness and headaches.  Psychiatric/Behavioral:  Negative for dysphoric mood and sleep disturbance. The patient is not nervous/anxious.    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   COVID-19 virus infection 08/31/2021   Hyperlipidemia    Hyperkalemia    Chronic obstructive pulmonary disease, unspecified ([place]) 07/11/2021   Proteinuria, unspecified 05/16/2021   CKD (chronic kidney disease) stage 4, GFR 15-29 ml/min ([place]) 03/27/2021   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   Mixed hyperlipidemia 03/26/2021   Acquired hypothyroidism 03/26/2021   Essential hypertension 03/26/2021   Tobacco use disorder 03/26/2021   Chronic bilateral low back pain without sciatica 03/26/2021    No Known Allergies  Past Surgical History:  Procedure Laterality Date   CYSTOSCOPY W/ RETROGRADES Bilateral 07/03/2021   Procedure: CYSTOSCOPY WITH RETROGRADE PYELOGRAM;  Surgeon: Hollice Espy, MD;  Location: [facility] ORS;  Service: Urology;  Laterality: Bilateral;   CYSTOSCOPY WITH LITHOLAPAXY N/A 07/03/2021   Procedure: CYSTOSCOPY WITH LITHOLAPAXY;  Surgeon: Hollice Espy, MD;  Location: [facility] ORS;  Service: Urology;  Laterality: N/A;   HOLEP-LASER ENUCLEATION OF THE PROSTATE WITH MORCELLATION N/A 07/03/2021   Procedure: HOLEP-LASER ENUCLEATION OF THE PROSTATE WITH MORCELLATION;  Surgeon: Hollice Espy, MD;  Location: [facility] ORS;  Service: Urology;  Laterality: N/A;   SPINE SURGERY  2015   X 4 surgery - lumbar disc    Social History   Tobacco Use   Smoking status: Heavy Smoker    Packs/day: 1.00    Years: 54.00    Pack years: 54.00    Types: Cigarettes   Smokeless tobacco: Never  Vaping Use   Vaping Use: Never used  Substance Use Topics   Alcohol use: Not Currently    Comment: quit drinking at 19   Drug use: Never      Medication list has been reviewed and updated.  Current Meds  Medication Sig   acetaminophen (TYLENOL) 500 MG tablet Take 1,000 mg by mouth every 6 (six) hours as needed for moderate pain.   aspirin EC 81 MG EC tablet Take 1 tablet (81 mg total) by mouth daily. Swallow whole.   atorvastatin (LIPITOR) 40 MG tablet Take 1 tablet (40 mg total) by mouth daily.   Cholecalciferol (D3-1000 PO) Take 2,000 Units by mouth daily.   clopidogrel (PLAVIX) 75 MG tablet Take 1 tablet (75 mg total) by mouth daily.   levothyroxine (EUTHYROX) 112 MCG tablet Take 1 tablet (112 mcg total) by mouth daily before breakfast.   MAGNESIUM PO Take 1,000 mg by mouth daily.   Multiple Vitamins-Minerals (MULTIVITAL-M PO) Take 1 tablet by mouth daily.   omeprazole (PRILOSEC) 20 MG capsule Take 20 mg by mouth daily.   tamsulosin (FLOMAX) 0.4 MG CAPS capsule Take 0.8 mg by mouth daily after supper.    PHQ 2/9 Scores 07/18/2021 03/26/2021  PHQ - 2 Score 0 1  PHQ- 9 Score - 9    GAD 7 : Generalized Anxiety Score 03/26/2021  Nervous, Anxious, on Edge 0  Control/stop worrying 0  Worry too much - different things 0  Trouble relaxing 0  Restless 0  Easily annoyed or irritable 0  Afraid - awful might happen 0  Total GAD 7 Score 0  Anxiety Difficulty Not difficult at all    BP Readings from Last 3 Encounters:  09/05/21 (!) 144/86  09/01/21 (!) 162/75  07/10/21 (!) 173/76    Physical Exam Vitals and nursing note reviewed.  Constitutional:      General: He is not in acute distress.    Appearance: Normal appearance. He is well-developed.  HENT:     Head: Normocephalic and atraumatic.  Neck:     Vascular: No carotid bruit.  Cardiovascular:     Rate and Rhythm: Normal rate and regular rhythm.     Pulses: Normal pulses.  Pulmonary:     Effort: Pulmonary effort is normal. No respiratory distress.     Breath sounds: Decreased breath sounds present. No wheezing.  Musculoskeletal:     Cervical back: Normal  range of motion.  Lymphadenopathy:     Cervical: No cervical adenopathy.  Skin:    General: Skin is warm and dry.     Findings: No rash.  Neurological:     General: No focal deficit present.     Mental Status: He is alert and oriented to person, place, and time.     Cranial Nerves: Cranial nerves 2-12 are intact.     Motor: Motor function is intact. No weakness, tremor or atrophy.     Coordination: Coordination is intact.     Gait: Gait is intact.     Deep Tendon Reflexes: Reflexes are normal and symmetric.  Psychiatric:        Mood and Affect: Mood normal.        Behavior: Behavior normal.    Wt Readings from Last 3 Encounters:  09/05/21 190 lb (86.2 kg)  08/31/21 210 lb (95.3 kg)  07/10/21 190 lb (86.2 kg)    BP (!) 144/86 (BP Location: Left Arm, Patient Position: Sitting, Cuff Size: Normal)   Pulse 69   Temp 97.9 F (36.6 C) (Oral)   Ht 5\' 11"  (1.803 m)   Wt 190 lb (86.2 kg)   SpO2 98%   BMI 26.50 kg/m   Assessment and Plan: 1. Brainstem stroke [facility]) He is almost fully recovered; finish 21 days of Plavix then continue ASA 81 mg indefinitely He may resume driving a car/truck but not a motorcycle.  2. Primary hypertension Resume Coreg bid today At Nephrology visit in 2 weeks, discuss resuming Lisinopril - carvedilol (COREG) 25 MG tablet; Take 1 tablet (25 mg total) by mouth 2 (two) times daily with a meal.  Dispense: 60 tablet; Refill: 5  3. Mixed hyperlipidemia He did not tolerate Lipitor so has not been taking it regularly. Will change therapy to Crestor.  Call if this is not tolerated. - rosuvastatin (CRESTOR) 5 MG tablet; Take 1 tablet (5 mg total) by mouth daily.  Dispense: 90 tablet; Refill: 1  4. GERD without esophagitis Symptoms well controlled on daily PPI No red flag signs such as weight loss, n/v, melena Will continue omeprazole - omeprazole (PRILOSEC) 20 MG capsule; Take 1 capsule (20 mg total) by mouth daily.  Dispense: 30 capsule; Refill: 5  5.  Chronic bilateral low back pain without sciatica Handicapped license application given.  6. Tobacco use disorder Discussed using nicotine patches to quit vs tapering The role of tobacco use in vascular disease stressed again.   Partially dictated using Editor, commissioning. Any errors are unintentional.  Halina Maidens, MD [facility]  [facility]  09/05/2021

## 2021-09-10 NOTE — Progress Notes (Signed)
09/11/21 1:43 PM   Tyrone Moore October 11, 1951 [id number]  Referring provider:  Glean Hess, MD [address] [place],  [place] [address] Chief Complaint  Patient presents with   Post-op Follow-up    Post HOLEP     HPI: Tyrone Moore is a 70 y.o.male with a personal history partially of chronic outlet obstruction, CKD, and left lower pole renal mass who returns today for post-op follow-up .   He s/p HoLEP on 07/03/2021. Intraoperative findings showed unremarkable upper tract imaging other than J hooking of the distal ureter consistent with known BPH.  Trabeculated bladder with evidence of chronic outlet obstruction, elevated bladder neck without a discrete circumscribed median lobe. Prostate chips were sent for pathology. Pathology showed benign prostatic tissue with nodular hyperplasia negative for malignancy. 24.5 g was resected.   Post operatively he was admitted to the hospital for stroke like symptoms, he had a negative MRI.   He is accompanied by his son today and is doing well. He states he is emptying well and he has no bleeding or pain. He is leaking when active. He wears a pad. He does not participate in pelvic floor exercises (Kegel) consistently .     IPSS     Row Name 09/11/21 1300         International Prostate Symptom Score   How often have you had the sensation of not emptying your bladder? Not at All     How often have you had to urinate less than every two hours? Less than half the time     How often have you found you stopped and started again several times when you urinated? Not at All     How often have you found it difficult to postpone urination? Less than 1 in 5 times     How often have you had a weak urinary stream? About half the time     How often have you had to strain to start urination? Less than 1 in 5 times     How many times did you typically get up at night to urinate? 3 Times     Total IPSS Score 10       Quality of Life due to  urinary symptoms   If you were to spend the rest of your life with your urinary condition just the way it is now how would you feel about that? 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   Chronic bilateral low back pain without sciatica 03/26/2021   S/p 4 lumbar spine surgeries   Chronic obstructive pulmonary disease, unspecified ([place]) 07/11/2021   CKD (chronic kidney disease) stage 4, GFR 15-29 ml/min ([place]) 03/27/2021   COVID-19 virus infection 08/31/2021   Essential hypertension 03/26/2021   GERD (gastroesophageal reflux disease)    Hyperlipidemia    Hypertension    Mixed hyperlipidemia 03/26/2021   Thyroid disease     Surgical History: Past Surgical History:  Procedure Laterality Date   CYSTOSCOPY W/ RETROGRADES Bilateral 07/03/2021   Procedure: CYSTOSCOPY WITH RETROGRADE PYELOGRAM;  Surgeon: Hollice Espy, MD;  Location: [facility];  Service: Urology;  Laterality: Bilateral;   CYSTOSCOPY WITH LITHOLAPAXY N/A 07/03/2021  Procedure: CYSTOSCOPY WITH LITHOLAPAXY;  Surgeon: Hollice Espy, MD;  Location: [facility];  Service: Urology;  Laterality: N/A;   HOLEP-LASER ENUCLEATION OF THE PROSTATE WITH MORCELLATION N/A 07/03/2021   Procedure: HOLEP-LASER ENUCLEATION OF THE PROSTATE WITH MORCELLATION;  Surgeon: Hollice Espy, MD;  Location: [facility];  Service: Urology;  Laterality: N/A;   SPINE SURGERY  2015   X 4 surgery - lumbar disc    Home Medications:  Allergies as of 09/11/2021   No Known Allergies      Medication List        Accurate as of September 11, 2021  1:43 PM. If you have any questions, ask your nurse or doctor.          STOP taking these medications    tamsulosin 0.4 MG Caps  capsule Commonly known as: FLOMAX Stopped by: Hollice Espy, MD       TAKE these medications    acetaminophen 500 MG tablet Commonly known as: TYLENOL Take 1,000 mg by mouth every 6 (six) hours as needed for moderate pain.   aspirin 81 MG EC tablet Take 1 tablet (81 mg total) by mouth daily. Swallow whole.   carvedilol 25 MG tablet Commonly known as: COREG Take 1 tablet (25 mg total) by mouth 2 (two) times daily with a meal.   clopidogrel 75 MG tablet Commonly known as: PLAVIX Take 1 tablet (75 mg total) by mouth daily.   D3-1000 PO Take 2,000 Units by mouth daily.   levothyroxine 112 MCG tablet Commonly known as: Euthyrox Take 1 tablet (112 mcg total) by mouth daily before breakfast.   MAGNESIUM PO Take 1,000 mg by mouth daily.   MULTIVITAL-M PO Take 1 tablet by mouth daily.   omeprazole 20 MG capsule Commonly known as: PRILOSEC Take 1 capsule (20 mg total) by mouth daily.   rosuvastatin 5 MG tablet Commonly known as: Crestor Take 1 tablet (5 mg total) by mouth daily.        Allergies: No Known Allergies  Family History: Family History  Problem Relation Age of Onset   Hypertension Mother    Heart disease Mother    Heart disease Sister    Hypertension Sister    Prostate cancer Neg Hx    Bladder Cancer Neg Hx    Kidney cancer Neg Hx     Social History:  reports that he has been smoking cigarettes. He has a 54.00 pack-year smoking history. He has never used smokeless tobacco. He reports that he does not currently use alcohol. He reports that he does not use drugs.   Physical Exam: BP (!) 149/66   Pulse 80   Ht 5\' 11"  (1.803 m)   Wt 191 lb (86.6 kg)   BMI 26.64 kg/m   Constitutional:  Alert and oriented, No acute distress. HEENT:  AT, moist mucus membranes.  Trachea midline, no masses. Cardiovascular: No clubbing, cyanosis, or edema. Respiratory: Normal respiratory effort, no increased work of breathing. Skin: No rashes, bruises or  suspicious lesions. Neurologic: Grossly intact, no focal deficits, moving all 4 extremities. Psychiatric: Normal mood and affect.  Laboratory Data:  Lab Results  Component Value Date   CREATININE 3.53 (H) 09/01/2021    Lab Results  Component Value Date   HGBA1C 6.0 (H) 08/31/2021     Pertinent Imaging: Results for orders placed or performed in visit on 09/11/21  BLADDER SCAN AMB NON-IMAGING  Result Value Ref Range   Scan Result 8ml      Assessment & Plan:  BPH with obstruction/LUTS  - S/p HoLEP  - Obstructive ymptoms have improved  - He is emptying adequately today  - PSA; pending (new baseline at f/u visit)  Stress incontinence  - Discussed the importance of maintaining pelvic floor muscles. Counseled him on pelvic floor exercises. If he does not have any improvement on SUI. Will refer him to physical therapy.  -Anticipate improvement with time  Follow-up in 6 months  I,Kailey Littlejohn,acting as a scribe for Hollice Espy, MD.,have documented all relevant documentation on the behalf of Hollice Espy, MD,as directed by  Hollice Espy, MD while in the presence of Hollice Espy, MD.  I have reviewed the above documentation for accuracy and completeness, and I agree with the above.   Hollice Espy, MD  [facility] [address], [place], [place] [address] [phone]

## 2021-09-11 ENCOUNTER — Ambulatory Visit (INDEPENDENT_AMBULATORY_CARE_PROVIDER_SITE_OTHER): Payer: Medicare Other | Admitting: Urology

## 2021-09-11 ENCOUNTER — Other Ambulatory Visit: Payer: Self-pay

## 2021-09-11 ENCOUNTER — Encounter: Payer: Self-pay | Admitting: Urology

## 2021-09-11 VITALS — BP 149/66 | HR 80 | Ht 71.0 in | Wt 191.0 lb

## 2021-09-11 DIAGNOSIS — N138 Other obstructive and reflux uropathy: Secondary | ICD-10-CM | POA: Diagnosis not present

## 2021-09-11 DIAGNOSIS — N401 Enlarged prostate with lower urinary tract symptoms: Secondary | ICD-10-CM

## 2021-09-11 LAB — BLADDER SCAN AMB NON-IMAGING

## 2021-09-11 NOTE — Patient Instructions (Signed)
Kegel Exercises  Kegel exercises can help strengthen your pelvic floor muscles. The pelvic floor is a group of muscles that support your rectum, small intestine, and bladder. In females, pelvic floor muscles also help support the womb (uterus). These muscles help you control the flow of urine and stool. Kegel exercises are painless and simple, and they do not require any equipment. Your provider may suggest Kegel exercises to: Improve bladder and bowel control. Improve sexual response. Improve weak pelvic floor muscles after surgery to remove the uterus (hysterectomy) or pregnancy (females). Improve weak pelvic floor muscles after prostate gland removal or surgery (males). Kegel exercises involve squeezing your pelvic floor muscles, which are the same muscles you squeeze when you try to stop the flow of urine or keep from passing gas. The exercises can be done while sitting, standing, or lying down, but itis best to vary your position. Exercises How to do Kegel exercises: Squeeze your pelvic floor muscles tight. You should feel a tight lift in your rectal area. If you are a male, you should also feel a tightness in your vaginal area. Keep your stomach, buttocks, and legs relaxed. Hold the muscles tight for up to 10 seconds. Breathe normally. Relax your muscles. Repeat as told by your health care provider. Repeat this exercise daily as told by your health care provider. Continue to do this exercise for at least 4-6 weeks, or for as long as told by your healthcare provider. You may be referred to a physical therapist who can help you learn more abouthow to do Kegel exercises. Depending on your condition, your health care provider may recommend: Varying how long you squeeze your muscles. Doing several sets of exercises every day. Doing exercises for several weeks. Making Kegel exercises a part of your regular exercise routine. This information is not intended to replace advice given to you by  your health care provider. Make sure you discuss any questions you have with your healthcare provider. Document Revised: 10/18/2020 Document Reviewed: 06/17/2018 Elsevier Patient Education  2022 Elsevier Inc.  

## 2021-09-13 DIAGNOSIS — I10 Essential (primary) hypertension: Secondary | ICD-10-CM | POA: Diagnosis not present

## 2021-09-13 DIAGNOSIS — J449 Chronic obstructive pulmonary disease, unspecified: Secondary | ICD-10-CM | POA: Diagnosis not present

## 2021-09-13 DIAGNOSIS — N281 Cyst of kidney, acquired: Secondary | ICD-10-CM | POA: Diagnosis not present

## 2021-09-13 DIAGNOSIS — R809 Proteinuria, unspecified: Secondary | ICD-10-CM | POA: Diagnosis not present

## 2021-09-13 DIAGNOSIS — N184 Chronic kidney disease, stage 4 (severe): Secondary | ICD-10-CM | POA: Diagnosis not present

## 2021-09-19 DIAGNOSIS — R809 Proteinuria, unspecified: Secondary | ICD-10-CM | POA: Diagnosis not present

## 2021-09-19 DIAGNOSIS — I10 Essential (primary) hypertension: Secondary | ICD-10-CM | POA: Diagnosis not present

## 2021-09-19 DIAGNOSIS — N2581 Secondary hyperparathyroidism of renal origin: Secondary | ICD-10-CM | POA: Diagnosis not present

## 2021-09-19 DIAGNOSIS — J449 Chronic obstructive pulmonary disease, unspecified: Secondary | ICD-10-CM | POA: Diagnosis not present

## 2021-09-19 DIAGNOSIS — N184 Chronic kidney disease, stage 4 (severe): Secondary | ICD-10-CM | POA: Diagnosis not present

## 2021-09-19 DIAGNOSIS — N281 Cyst of kidney, acquired: Secondary | ICD-10-CM | POA: Diagnosis not present

## 2021-09-19 DIAGNOSIS — D631 Anemia in chronic kidney disease: Secondary | ICD-10-CM | POA: Diagnosis not present

## 2021-09-19 DIAGNOSIS — E875 Hyperkalemia: Secondary | ICD-10-CM | POA: Diagnosis not present

## 2021-09-26 ENCOUNTER — Ambulatory Visit (INDEPENDENT_AMBULATORY_CARE_PROVIDER_SITE_OTHER): Payer: Medicare Other | Admitting: Internal Medicine

## 2021-09-26 ENCOUNTER — Other Ambulatory Visit: Payer: Self-pay

## 2021-09-26 ENCOUNTER — Encounter: Payer: Self-pay | Admitting: Internal Medicine

## 2021-09-26 VITALS — BP 148/76 | HR 69 | Ht 71.0 in | Wt 193.0 lb

## 2021-09-26 DIAGNOSIS — F17200 Nicotine dependence, unspecified, uncomplicated: Secondary | ICD-10-CM | POA: Diagnosis not present

## 2021-09-26 DIAGNOSIS — Z8673 Personal history of transient ischemic attack (TIA), and cerebral infarction without residual deficits: Secondary | ICD-10-CM | POA: Diagnosis not present

## 2021-09-26 DIAGNOSIS — N184 Chronic kidney disease, stage 4 (severe): Secondary | ICD-10-CM

## 2021-09-26 DIAGNOSIS — E782 Mixed hyperlipidemia: Secondary | ICD-10-CM | POA: Diagnosis not present

## 2021-09-26 DIAGNOSIS — I10 Essential (primary) hypertension: Secondary | ICD-10-CM | POA: Diagnosis not present

## 2021-09-26 NOTE — Progress Notes (Signed)
Date:  09/26/2021   Name:  Tyrone Moore   DOB:  [birth year]   MRN:  [id number]   Chief Complaint: No chief complaint on file.  Hypertension This is a chronic problem. The problem has been waxing and waning since onset. Pertinent negatives include no chest pain, headaches, palpitations or shortness of breath. Past treatments include beta blockers (ACEI held due to CKD and high K+). The current treatment provides moderate improvement. Hypertensive end-organ damage includes CVA.  Hyperlipidemia This is a chronic problem. The problem is uncontrolled. Pertinent negatives include no chest pain or shortness of breath. Current antihyperlipidemic treatment includes statins (tolerating crestor now). There are no compliance problems.   CVA - he is back to baseline with no impairment noted.  He completed 3 weeks of Plavix and continues on aspirin. He should be able to drive locally but I don't recommend driving across the country alone.  Lab Results  Component Value Date   CREATININE 3.53 (H) 09/01/2021   BUN 42 (H) 09/01/2021   NA 141 09/01/2021   K 4.9 09/01/2021   CL 113 (H) 09/01/2021   CO2 23 09/01/2021   Lab Results  Component Value Date   CHOL 272 (H) 09/01/2021   HDL 34 (L) 09/01/2021   LDLCALC 197 (H) 09/01/2021   TRIG 206 (H) 09/01/2021   CHOLHDL 8.0 09/01/2021   Lab Results  Component Value Date   TSH 28.489 (H) 08/31/2021   Lab Results  Component Value Date   HGBA1C 6.0 (H) 08/31/2021   Lab Results  Component Value Date   WBC 6.4 08/31/2021   HGB 12.2 (L) 08/31/2021   HCT 36.1 (L) 08/31/2021   MCV 100.0 08/31/2021   PLT 173 08/31/2021   Lab Results  Component Value Date   ALT 11 08/31/2021   AST 19 08/31/2021   ALKPHOS 98 08/31/2021   BILITOT 0.8 08/31/2021     Review of Systems  Constitutional:  Negative for fatigue and unexpected weight change.  HENT:  Negative for nosebleeds and trouble swallowing.   Eyes:  Negative for visual disturbance.   Respiratory:  Negative for cough, chest tightness, shortness of breath and wheezing.   Cardiovascular:  Negative for chest pain, palpitations and leg swelling.  Gastrointestinal:  Negative for abdominal pain, constipation, diarrhea and nausea.  Neurological:  Negative for dizziness, weakness, light-headedness, numbness and headaches.  Psychiatric/Behavioral:  Negative for dysphoric mood and sleep disturbance. The patient is not nervous/anxious.    Patient Active Problem List   Diagnosis Date Noted   History of stroke 09/01/2021   Hyperlipidemia    Hyperkalemia    Anemia in chronic kidney disease 08/15/2021   Cyst of kidney, acquired 08/15/2021   Hyperparathyroidism due to renal insufficiency ([place]) 08/15/2021   Chronic obstructive pulmonary disease, unspecified ([place]) 07/11/2021   Proteinuria, unspecified 05/16/2021   CKD (chronic kidney disease) stage 4, GFR 15-29 ml/min ([place]) 03/27/2021   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   Mixed hyperlipidemia 03/26/2021   Acquired hypothyroidism 03/26/2021   Essential hypertension 03/26/2021   Tobacco use disorder 03/26/2021   Chronic bilateral low back pain without sciatica 03/26/2021    No Known Allergies  Past Surgical History:  Procedure Laterality Date   CYSTOSCOPY W/ RETROGRADES Bilateral 07/03/2021   Procedure: CYSTOSCOPY WITH RETROGRADE PYELOGRAM;  Surgeon: Hollice Espy, MD;  Location: [facility];  Service: Urology;  Laterality: Bilateral;   CYSTOSCOPY WITH LITHOLAPAXY N/A 07/03/2021   Procedure: CYSTOSCOPY WITH LITHOLAPAXY;  Surgeon: Hollice Espy, MD;  Location: [facility];  Service: Urology;  Laterality: N/A;   HOLEP-LASER ENUCLEATION OF THE PROSTATE WITH MORCELLATION N/A 07/03/2021   Procedure: HOLEP-LASER ENUCLEATION OF THE PROSTATE WITH MORCELLATION;  Surgeon: Hollice Espy, MD;  Location: [facility];  Service: Urology;  Laterality: N/A;   SPINE SURGERY  2015   X 4 surgery - lumbar disc    Social  History   Tobacco Use   Smoking status: Heavy Smoker    Packs/day: 1.00    Years: 54.00    Pack years: 54.00    Types: Cigarettes   Smokeless tobacco: Never  Vaping Use   Vaping Use: Never used  Substance Use Topics   Alcohol use: Not Currently    Comment: quit drinking at 19   Drug use: Never     Medication list has been reviewed and updated.  Current Meds  Medication Sig   acetaminophen (TYLENOL) 500 MG tablet Take 1,000 mg by mouth every 6 (six) hours as needed for moderate pain.   aspirin EC 81 MG EC tablet Take 1 tablet (81 mg total) by mouth daily. Swallow whole.   carvedilol (COREG) 25 MG tablet Take 1 tablet (25 mg total) by mouth 2 (two) times daily with a meal.   Cholecalciferol (D3-1000 PO) Take 2,000 Units by mouth daily.   levothyroxine (EUTHYROX) 112 MCG tablet Take 1 tablet (112 mcg total) by mouth daily before breakfast.   MAGNESIUM PO Take 1,000 mg by mouth daily.   Multiple Vitamins-Minerals (MULTIVITAL-M PO) Take 1 tablet by mouth daily.   omeprazole (PRILOSEC) 20 MG capsule Take 1 capsule (20 mg total) by mouth daily.   rosuvastatin (CRESTOR) 5 MG tablet Take 1 tablet (5 mg total) by mouth daily.    PHQ 2/9 Scores 09/26/2021 09/05/2021 07/18/2021 03/26/2021  PHQ - 2 Score 0 0 0 1  PHQ- 9 Score 0 0 - 9    GAD 7 : Generalized Anxiety Score 09/26/2021 09/05/2021 03/26/2021  Nervous, Anxious, on Edge 0 0 0  Control/stop worrying 0 0 0  Worry too much - different things 1 0 0  Trouble relaxing 0 0 0  Restless 0 0 0  Easily annoyed or irritable 0 0 0  Afraid - awful might happen 0 0 0  Total GAD 7 Score 1 0 0  Anxiety Difficulty Not difficult at all Not difficult at all Not difficult at all    BP Readings from Last 3 Encounters:  09/26/21 (!) 148/76  09/11/21 (!) 149/66  09/05/21 (!) 144/86    Physical Exam Vitals and nursing note reviewed.  Constitutional:      General: He is not in acute distress.    Appearance: Normal appearance. He is  well-developed.  HENT:     Head: Normocephalic and atraumatic.  Cardiovascular:     Rate and Rhythm: Normal rate and regular rhythm.     Pulses: Normal pulses.  Pulmonary:     Effort: Pulmonary effort is normal. No respiratory distress.     Breath sounds: Examination of the right-lower field reveals wheezing. Examination of the left-lower field reveals wheezing. Wheezing present.  Musculoskeletal:     Cervical back: Normal range of motion.  Lymphadenopathy:     Cervical: No cervical adenopathy.  Skin:    General: Skin is warm and dry.     Findings: No rash.  Neurological:     Mental Status: He is alert and oriented to person, place, and time.  Psychiatric:        Mood and  Affect: Mood normal.        Behavior: Behavior normal.    Wt Readings from Last 3 Encounters:  09/26/21 193 lb (87.5 kg)  09/11/21 191 lb (86.6 kg)  09/05/21 190 lb (86.2 kg)    BP (!) 148/76   Pulse 69   Ht 5\' 11"  (1.803 m)   Wt 193 lb (87.5 kg)   SpO2 97%   BMI 26.92 kg/m   Assessment and Plan: 1. Essential hypertension BP remains slightly elevated Continue Coreg, sodium restriction Follow up with Nephrology Consider adding amlodipine if BP remains high  2. Mixed hyperlipidemia Now tolerating Crestor without N/V/D Will check labs next visit in 3 months  3. CKD (chronic kidney disease) stage 4, GFR 15-29 ml/min ([facility]) Stable GFR but with elevated potassium Holding lisinopril for now  4. Tobacco use disorder He has cut back from 2 to 1 ppd He is congratulated and encouraged to continue reduction  5. History of stroke Continue ASA daily He can drive locally but should not drive long distance alone   Partially dictated using Editor, commissioning. Any errors are unintentional.  Halina Maidens, MD [facility]  09/26/2021

## 2021-10-17 DIAGNOSIS — N281 Cyst of kidney, acquired: Secondary | ICD-10-CM | POA: Diagnosis not present

## 2021-10-17 DIAGNOSIS — N184 Chronic kidney disease, stage 4 (severe): Secondary | ICD-10-CM | POA: Diagnosis not present

## 2021-10-17 DIAGNOSIS — J449 Chronic obstructive pulmonary disease, unspecified: Secondary | ICD-10-CM | POA: Diagnosis not present

## 2021-10-17 DIAGNOSIS — N2581 Secondary hyperparathyroidism of renal origin: Secondary | ICD-10-CM | POA: Diagnosis not present

## 2021-10-17 DIAGNOSIS — R809 Proteinuria, unspecified: Secondary | ICD-10-CM | POA: Diagnosis not present

## 2021-12-05 DIAGNOSIS — N184 Chronic kidney disease, stage 4 (severe): Secondary | ICD-10-CM | POA: Diagnosis not present

## 2021-12-05 DIAGNOSIS — R809 Proteinuria, unspecified: Secondary | ICD-10-CM | POA: Diagnosis not present

## 2021-12-05 DIAGNOSIS — D631 Anemia in chronic kidney disease: Secondary | ICD-10-CM | POA: Diagnosis not present

## 2021-12-05 DIAGNOSIS — E875 Hyperkalemia: Secondary | ICD-10-CM | POA: Diagnosis not present

## 2021-12-05 DIAGNOSIS — I10 Essential (primary) hypertension: Secondary | ICD-10-CM | POA: Diagnosis not present

## 2021-12-05 DIAGNOSIS — N281 Cyst of kidney, acquired: Secondary | ICD-10-CM | POA: Diagnosis not present

## 2021-12-05 DIAGNOSIS — N2581 Secondary hyperparathyroidism of renal origin: Secondary | ICD-10-CM | POA: Diagnosis not present

## 2021-12-05 DIAGNOSIS — J449 Chronic obstructive pulmonary disease, unspecified: Secondary | ICD-10-CM | POA: Diagnosis not present

## 2021-12-05 LAB — COMPREHENSIVE METABOLIC PANEL: eGFR: 17

## 2021-12-05 LAB — CBC AND DIFFERENTIAL
HCT: 37 — AB (ref 41–53)
Hemoglobin: 12.3 — AB (ref 13.5–17.5)
Platelets: 120 — AB (ref 150–399)
WBC: 6.8

## 2021-12-05 LAB — BASIC METABOLIC PANEL
BUN: 46 — AB (ref 4–21)
Creatinine: 3.7 — AB (ref 0.6–1.3)

## 2021-12-27 ENCOUNTER — Other Ambulatory Visit: Payer: Self-pay

## 2021-12-27 ENCOUNTER — Encounter: Payer: Self-pay | Admitting: Internal Medicine

## 2021-12-27 ENCOUNTER — Ambulatory Visit (INDEPENDENT_AMBULATORY_CARE_PROVIDER_SITE_OTHER): Payer: Medicare Other | Admitting: Internal Medicine

## 2021-12-27 VITALS — BP 138/78 | HR 74 | Ht 71.0 in | Wt 195.0 lb

## 2021-12-27 DIAGNOSIS — E039 Hypothyroidism, unspecified: Secondary | ICD-10-CM | POA: Diagnosis not present

## 2021-12-27 DIAGNOSIS — J449 Chronic obstructive pulmonary disease, unspecified: Secondary | ICD-10-CM

## 2021-12-27 DIAGNOSIS — N184 Chronic kidney disease, stage 4 (severe): Secondary | ICD-10-CM | POA: Diagnosis not present

## 2021-12-27 DIAGNOSIS — N2581 Secondary hyperparathyroidism of renal origin: Secondary | ICD-10-CM | POA: Diagnosis not present

## 2021-12-27 DIAGNOSIS — I10 Essential (primary) hypertension: Secondary | ICD-10-CM

## 2021-12-27 DIAGNOSIS — E782 Mixed hyperlipidemia: Secondary | ICD-10-CM | POA: Diagnosis not present

## 2021-12-27 NOTE — Progress Notes (Signed)
Date:  12/27/2021   Name:  Tyrone Moore   DOB:  [birth year]   MRN:  [id number]   Chief Complaint: Hyperlipidemia and Hypertension  Hyperlipidemia This is a chronic problem. Factors aggravating his hyperlipidemia include smoking. Pertinent negatives include no chest pain or shortness of breath. Current antihyperlipidemic treatment includes statins (tolerating Crestor without side effects).  Hypertension This is a chronic problem. The problem is controlled. Pertinent negatives include no chest pain, headaches or shortness of breath. Past treatments include ACE inhibitors. The current treatment provides significant improvement. There are no compliance problems.  Hypertensive end-organ damage includes kidney disease.  CVA - no residual deficit.  Still smoking and does not have any desire to quit.  He is on aspirin daily.  BP has been controlled. Stage IV CKD - followed by Nephrology - now going monthly due to GFR = 17.  Lab Results  Component Value Date   NA 141 09/01/2021   K 4.9 09/01/2021   CO2 23 09/01/2021   GLUCOSE 98 09/01/2021   BUN 42 (H) 09/01/2021   CREATININE 3.53 (H) 09/01/2021   CALCIUM 8.7 (L) 09/01/2021   EGFR 20 (L) 03/26/2021   GFRNONAA 18 (L) 09/01/2021   Lab Results  Component Value Date   CHOL 272 (H) 09/01/2021   HDL 34 (L) 09/01/2021   LDLCALC 197 (H) 09/01/2021   TRIG 206 (H) 09/01/2021   CHOLHDL 8.0 09/01/2021   Lab Results  Component Value Date   TSH 28.489 (H) 08/31/2021   Lab Results  Component Value Date   HGBA1C 6.0 (H) 08/31/2021   Lab Results  Component Value Date   WBC 6.4 08/31/2021   HGB 12.2 (L) 08/31/2021   HCT 36.1 (L) 08/31/2021   MCV 100.0 08/31/2021   PLT 173 08/31/2021   Lab Results  Component Value Date   ALT 11 08/31/2021   AST 19 08/31/2021   ALKPHOS 98 08/31/2021   BILITOT 0.8 08/31/2021   No results found for: 25OHVITD2, 25OHVITD3, VD25OH   Review of Systems  Constitutional:  Negative for chills, fatigue and  fever.  HENT:  Negative for trouble swallowing.   Eyes:  Negative for visual disturbance.  Respiratory:  Positive for cough. Negative for chest tightness, shortness of breath and wheezing.   Cardiovascular:  Negative for chest pain and leg swelling.  Gastrointestinal:  Negative for abdominal pain, constipation and diarrhea.  Neurological:  Negative for dizziness and headaches.  Psychiatric/Behavioral:  Negative for dysphoric mood and sleep disturbance. The patient is not nervous/anxious.    Patient Active Problem List   Diagnosis Date Noted   History of stroke 09/01/2021   Hyperlipidemia    Hyperkalemia    Anemia in chronic kidney disease 08/15/2021   Cyst of kidney, acquired 08/15/2021   Hyperparathyroidism due to renal insufficiency ([place]) 08/15/2021   Chronic obstructive pulmonary disease, unspecified ([place]) 07/11/2021   Proteinuria, unspecified 05/16/2021   CKD (chronic kidney disease) stage 4, GFR 15-29 ml/min ([place]) 03/27/2021   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   Mixed hyperlipidemia 03/26/2021   Acquired hypothyroidism 03/26/2021   Essential hypertension 03/26/2021   Tobacco use disorder 03/26/2021   Chronic bilateral low back pain without sciatica 03/26/2021    No Known Allergies  Past Surgical History:  Procedure Laterality Date   CYSTOSCOPY W/ RETROGRADES Bilateral 07/03/2021   Procedure: CYSTOSCOPY WITH RETROGRADE PYELOGRAM;  Surgeon: Hollice Espy, MD;  Location: [facility];  Service: Urology;  Laterality: Bilateral;   CYSTOSCOPY WITH LITHOLAPAXY N/A  07/03/2021   Procedure: CYSTOSCOPY WITH LITHOLAPAXY;  Surgeon: Hollice Espy, MD;  Location: [facility];  Service: Urology;  Laterality: N/A;   HOLEP-LASER ENUCLEATION OF THE PROSTATE WITH MORCELLATION N/A 07/03/2021   Procedure: HOLEP-LASER ENUCLEATION OF THE PROSTATE WITH MORCELLATION;  Surgeon: Hollice Espy, MD;  Location: [facility];  Service: Urology;  Laterality: N/A;   SPINE SURGERY  2015    X 4 surgery - lumbar disc    Social History   Tobacco Use   Smoking status: Heavy Smoker    Packs/day: 1.00    Years: 54.00    Pack years: 54.00    Types: Cigarettes   Smokeless tobacco: Never  Vaping Use   Vaping Use: Never used  Substance Use Topics   Alcohol use: Not Currently    Comment: quit drinking at 19   Drug use: Never     Medication list has been reviewed and updated.  Current Meds  Medication Sig   acetaminophen (TYLENOL) 500 MG tablet Take 1,000 mg by mouth every 6 (six) hours as needed for moderate pain.   aspirin EC 81 MG EC tablet Take 1 tablet (81 mg total) by mouth daily. Swallow whole.   calcitRIOL (ROCALTROL) 0.25 MCG capsule Take 0.25 mcg by mouth daily.   Cholecalciferol (D3-1000 PO) Take 2,000 Units by mouth daily.   levothyroxine (EUTHYROX) 112 MCG tablet Take 1 tablet (112 mcg total) by mouth daily before breakfast.   lisinopril (ZESTRIL) 5 MG tablet Take 5 mg by mouth daily.   MAGNESIUM PO Take 1,000 mg by mouth daily.   Multiple Vitamins-Minerals (MULTIVITAL-M PO) Take 1 tablet by mouth daily.   omeprazole (PRILOSEC) 20 MG capsule Take 1 capsule (20 mg total) by mouth daily.   rosuvastatin (CRESTOR) 5 MG tablet Take 1 tablet (5 mg total) by mouth daily.    PHQ 2/9 Scores 12/27/2021 09/26/2021 09/05/2021 07/18/2021  PHQ - 2 Score 0 0 0 0  PHQ- 9 Score 2 0 0 -    GAD 7 : Generalized Anxiety Score 12/27/2021 09/26/2021 09/05/2021 03/26/2021  Nervous, Anxious, on Edge 0 0 0 0  Control/stop worrying 0 0 0 0  Worry too much - different things 0 1 0 0  Trouble relaxing 0 0 0 0  Restless 0 0 0 0  Easily annoyed or irritable 0 0 0 0  Afraid - awful might happen 0 0 0 0  Total GAD 7 Score 0 1 0 0  Anxiety Difficulty - Not difficult at all Not difficult at all Not difficult at all    BP Readings from Last 3 Encounters:  12/27/21 138/78  09/26/21 (!) 148/76  09/11/21 (!) 149/66    Physical Exam Vitals and nursing note reviewed.   Constitutional:      General: He is not in acute distress.    Appearance: Normal appearance. He is well-developed.  HENT:     Head: Normocephalic and atraumatic.  Neck:     Vascular: No carotid bruit.  Cardiovascular:     Rate and Rhythm: Normal rate and regular rhythm.     Pulses: Normal pulses.  Pulmonary:     Effort: Pulmonary effort is normal. No respiratory distress.     Breath sounds: Decreased breath sounds present. No wheezing or rhonchi.  Musculoskeletal:     Cervical back: Normal range of motion.     Right lower leg: No edema.     Left lower leg: No edema.  Lymphadenopathy:     Cervical: No cervical adenopathy.  Skin:    General: Skin is warm and dry.     Findings: No rash.  Neurological:     Mental Status: He is alert and oriented to person, place, and time.     Cranial Nerves: Cranial nerves 2-12 are intact.     Sensory: Sensation is intact.     Motor: Motor function is intact.  Psychiatric:        Mood and Affect: Mood normal.        Behavior: Behavior normal.    Wt Readings from Last 3 Encounters:  12/27/21 195 lb (88.5 kg)  09/26/21 193 lb (87.5 kg)  09/11/21 191 lb (86.6 kg)    BP 138/78 (BP Location: Right Arm, Cuff Size: Large)    Pulse 74    Ht 5' 11"  (1.803 m)    Wt 195 lb (88.5 kg)    SpO2 95%    BMI 27.20 kg/m   Assessment and Plan: 1. Essential hypertension Clinically stable exam with well controlled BP on lisinopril 5 mg. Tolerating medications without side effects at this time. Pt to continue current regimen and low sodium diet; benefits of regular exercise as able discussed.  2. Mixed hyperlipidemia Now on Crestor without side effects Will check labs - Lipid panel - Hepatic function panel  3. Acquired hypothyroidism Supplemented; last TSH was elevated - TSH + free T4  4. CKD (chronic kidney disease) stage 4, GFR 15-29 ml/min (HCC) Followed by Nephrology Last GFR 17  5. Chronic obstructive pulmonary disease, unspecified COPD type  ([place]) He continues to smoke and is not interested in quitting  6. Hyperparathyroidism due to renal insufficiency ([place]) Monitored by Nephrology   Partially dictated using Dragon software. Any errors are unintentional.  Halina Maidens, MD [facility]  12/27/2021

## 2021-12-28 LAB — HEPATIC FUNCTION PANEL
ALT: 13 IU/L (ref 0–44)
AST: 18 IU/L (ref 0–40)
Albumin: 4.2 g/dL (ref 3.8–4.8)
Alkaline Phosphatase: 105 IU/L (ref 44–121)
Bilirubin Total: 0.2 mg/dL (ref 0.0–1.2)
Bilirubin, Direct: <0.1 mg/dL (ref 0.00–0.40)
Total Protein: 7.1 g/dL (ref 6.0–8.5)

## 2021-12-28 LAB — LIPID PANEL
Chol/HDL Ratio: 4 ratio (ref 0.0–5.0)
Cholesterol, Total: 145 mg/dL (ref 100–199)
HDL: 36 mg/dL — ABNORMAL LOW
LDL Chol Calc (NIH): 87 mg/dL (ref 0–99)
Triglycerides: 122 mg/dL (ref 0–149)
VLDL Cholesterol Cal: 22 mg/dL (ref 5–40)

## 2021-12-28 LAB — TSH+FREE T4
Free T4: 1.7 ng/dL (ref 0.82–1.77)
TSH: 2.87 u[IU]/mL (ref 0.450–4.500)

## 2022-01-16 DIAGNOSIS — J449 Chronic obstructive pulmonary disease, unspecified: Secondary | ICD-10-CM | POA: Diagnosis not present

## 2022-01-16 DIAGNOSIS — N2581 Secondary hyperparathyroidism of renal origin: Secondary | ICD-10-CM | POA: Diagnosis not present

## 2022-01-16 DIAGNOSIS — R809 Proteinuria, unspecified: Secondary | ICD-10-CM | POA: Diagnosis not present

## 2022-01-16 DIAGNOSIS — I10 Essential (primary) hypertension: Secondary | ICD-10-CM | POA: Diagnosis not present

## 2022-01-16 DIAGNOSIS — N281 Cyst of kidney, acquired: Secondary | ICD-10-CM | POA: Diagnosis not present

## 2022-01-16 DIAGNOSIS — N184 Chronic kidney disease, stage 4 (severe): Secondary | ICD-10-CM | POA: Diagnosis not present

## 2022-01-16 DIAGNOSIS — E875 Hyperkalemia: Secondary | ICD-10-CM | POA: Diagnosis not present

## 2022-01-16 DIAGNOSIS — D631 Anemia in chronic kidney disease: Secondary | ICD-10-CM | POA: Diagnosis not present

## 2022-02-13 DIAGNOSIS — N184 Chronic kidney disease, stage 4 (severe): Secondary | ICD-10-CM | POA: Diagnosis not present

## 2022-02-13 DIAGNOSIS — R809 Proteinuria, unspecified: Secondary | ICD-10-CM | POA: Diagnosis not present

## 2022-02-13 DIAGNOSIS — I10 Essential (primary) hypertension: Secondary | ICD-10-CM | POA: Diagnosis not present

## 2022-02-13 DIAGNOSIS — N281 Cyst of kidney, acquired: Secondary | ICD-10-CM | POA: Diagnosis not present

## 2022-02-13 DIAGNOSIS — J449 Chronic obstructive pulmonary disease, unspecified: Secondary | ICD-10-CM | POA: Diagnosis not present

## 2022-02-13 DIAGNOSIS — E875 Hyperkalemia: Secondary | ICD-10-CM | POA: Diagnosis not present

## 2022-02-13 DIAGNOSIS — D631 Anemia in chronic kidney disease: Secondary | ICD-10-CM | POA: Diagnosis not present

## 2022-02-13 DIAGNOSIS — N2581 Secondary hyperparathyroidism of renal origin: Secondary | ICD-10-CM | POA: Diagnosis not present

## 2022-02-13 LAB — BASIC METABOLIC PANEL
BUN: 48 — AB (ref 4–21)
CO2: 27 — AB (ref 13–22)
Chloride: 108 (ref 99–108)
Creatinine: 4.4 — AB (ref 0.6–1.3)
Glucose: 85
Potassium: 5.2 mEq/L — AB (ref 3.5–5.1)
Sodium: 141 (ref 137–147)

## 2022-02-13 LAB — COMPREHENSIVE METABOLIC PANEL: eGFR: 14

## 2022-02-13 LAB — MICROALBUMIN, URINE: Microalb, Ur: 1604

## 2022-02-13 LAB — CBC: RBC: 3.85 — AB (ref 3.87–5.11)

## 2022-02-13 LAB — CBC AND DIFFERENTIAL
HCT: 36 — AB (ref 41–53)
Hemoglobin: 12.3 — AB (ref 13.5–17.5)
Platelets: 162 10*3/uL (ref 150–400)
WBC: 8.2

## 2022-03-11 ENCOUNTER — Encounter: Payer: Self-pay | Admitting: Internal Medicine

## 2022-03-11 ENCOUNTER — Ambulatory Visit: Payer: Medicare Other | Admitting: Internal Medicine

## 2022-03-11 VITALS — BP 134/82 | HR 79 | Ht 71.0 in | Wt 198.0 lb

## 2022-03-11 DIAGNOSIS — N184 Chronic kidney disease, stage 4 (severe): Secondary | ICD-10-CM

## 2022-03-11 DIAGNOSIS — F17200 Nicotine dependence, unspecified, uncomplicated: Secondary | ICD-10-CM | POA: Diagnosis not present

## 2022-03-11 DIAGNOSIS — R7303 Prediabetes: Secondary | ICD-10-CM | POA: Diagnosis not present

## 2022-03-11 DIAGNOSIS — E039 Hypothyroidism, unspecified: Secondary | ICD-10-CM | POA: Diagnosis not present

## 2022-03-11 DIAGNOSIS — E782 Mixed hyperlipidemia: Secondary | ICD-10-CM | POA: Diagnosis not present

## 2022-03-11 DIAGNOSIS — I10 Essential (primary) hypertension: Secondary | ICD-10-CM

## 2022-03-11 DIAGNOSIS — K219 Gastro-esophageal reflux disease without esophagitis: Secondary | ICD-10-CM

## 2022-03-11 MED ORDER — LEVOTHYROXINE SODIUM 112 MCG PO TABS: 112.0000 ug | ORAL_TABLET | Freq: Every day | ORAL | 1 refills | Status: DC

## 2022-03-11 MED ORDER — OMEPRAZOLE 20 MG PO CPDR: 20.0000 mg | DELAYED_RELEASE_CAPSULE | Freq: Every day | ORAL | 1 refills | Status: DC

## 2022-03-11 MED ORDER — ROSUVASTATIN CALCIUM 5 MG PO TABS: 5.0000 mg | ORAL_TABLET | Freq: Every day | ORAL | 1 refills | Status: DC

## 2022-03-11 NOTE — Progress Notes (Signed)
? ? ?Date:  03/11/2022  ? ?Name:  Tyrone Moore   DOB:  1951-01-08   MRN:  [id number] ? ? ?Chief Complaint: Hypertension ? ?Hypertension ?This is a chronic problem. The problem is controlled. Pertinent negatives include no chest pain, headaches, palpitations or shortness of breath. Past treatments include beta blockers. The current treatment provides moderate improvement. Hypertensive end-organ damage includes kidney disease. end stage renal disease - lisinopril stopped. Identifiable causes of hypertension include a thyroid problem.  ?Thyroid Problem ?Presents for follow-up visit. Patient reports no constipation, diarrhea, fatigue or palpitations. The symptoms have been stable. His past medical history is significant for hyperlipidemia.  ?Hyperlipidemia ?This is a chronic problem. The problem is controlled. Pertinent negatives include no chest pain or shortness of breath. Current antihyperlipidemic treatment includes statins.  ?Diabetes ?He presents for his follow-up diabetic visit. Diabetes type: prediabetes. Pertinent negatives for hypoglycemia include no dizziness or headaches. Pertinent negatives for diabetes include no chest pain, no fatigue and no weakness. An ACE inhibitor/angiotensin II receptor blocker is contraindicated.  ? ?Lab Results  ?Component Value Date  ? NA 141 02/13/2022  ? K 5.2 (A) 02/13/2022  ? CO2 27 (A) 02/13/2022  ? GLUCOSE 98 09/01/2021  ? BUN 48 (A) 02/13/2022  ? CREATININE 4.4 (A) 02/13/2022  ? CALCIUM 8.7 (L) 09/01/2021  ? EGFR 14 02/13/2022  ? GFRNONAA 18 (L) 09/01/2021  ? ?Lab Results  ?Component Value Date  ? CHOL 145 12/27/2021  ? HDL 36 (L) 12/27/2021  ? [place] 87 12/27/2021  ? TRIG 122 12/27/2021  ? CHOLHDL 4.0 12/27/2021  ? ?Lab Results  ?Component Value Date  ? TSH 2.870 12/27/2021  ? ?Lab Results  ?Component Value Date  ? HGBA1C 6.0 (H) 08/31/2021  ? ?Lab Results  ?Component Value Date  ? WBC 8.2 02/13/2022  ? HGB 12.3 (A) 02/13/2022  ? HCT 36 (A) 02/13/2022  ? MCV 100.0  08/31/2021  ? PLT 162 02/13/2022  ? ?Lab Results  ?Component Value Date  ? ALT 13 12/27/2021  ? AST 18 12/27/2021  ? ALKPHOS 105 12/27/2021  ? BILITOT 0.2 12/27/2021  ? ?No results found for: 25OHVITD2, [place], VD25OH  ? ?Review of Systems  ?Constitutional:  Negative for fatigue and unexpected weight change.  ?HENT:  Negative for nosebleeds.   ?Eyes:  Negative for visual disturbance.  ?Respiratory:  Negative for cough, chest tightness, shortness of breath and wheezing.   ?Cardiovascular:  Negative for chest pain, palpitations and leg swelling.  ?Gastrointestinal:  Negative for abdominal pain, constipation and diarrhea.  ?Neurological:  Negative for dizziness, weakness, light-headedness and headaches.  ? ?Patient Active Problem List  ? Diagnosis Date Noted  ? History of stroke 09/01/2021  ? Anemia in chronic kidney disease 08/15/2021  ? Cyst of kidney, acquired 08/15/2021  ? Hyperparathyroidism due to renal insufficiency ([place]) 08/15/2021  ? Chronic obstructive pulmonary disease, unspecified ([place]) 07/11/2021  ? Proteinuria, unspecified 05/16/2021  ? CKD (chronic kidney disease) stage 4, GFR 15-29 ml/min (HCC) 03/27/2021  ? 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  ? Mixed hyperlipidemia 03/26/2021  ? Acquired hypothyroidism 03/26/2021  ? Essential hypertension 03/26/2021  ? Tobacco use disorder 03/26/2021  ? Chronic bilateral low back pain without sciatica 03/26/2021  ? ? ?No Known Allergies ? ?Past Surgical History:  ?Procedure Laterality Date  ? CYSTOSCOPY W/ RETROGRADES Bilateral 07/03/2021  ? Procedure: CYSTOSCOPY WITH RETROGRADE PYELOGRAM;  Surgeon: Hollice Espy, MD;  Location: [facility];  Service: Urology;  Laterality: Bilateral;  ? CYSTOSCOPY WITH  LITHOLAPAXY N/A 07/03/2021  ? Procedure: CYSTOSCOPY WITH LITHOLAPAXY;  Surgeon: Hollice Espy, MD;  Location: [facility];  Service: Urology;  Laterality: N/A;  ? HOLEP-LASER ENUCLEATION OF THE PROSTATE WITH MORCELLATION N/A 07/03/2021  ?  Procedure: HOLEP-LASER ENUCLEATION OF THE PROSTATE WITH MORCELLATION;  Surgeon: Hollice Espy, MD;  Location: [facility];  Service: Urology;  Laterality: N/A;  ? SPINE SURGERY  2015  ? X 4 surgery - lumbar disc  ? ? ?Social History  ? ?Tobacco Use  ? Smoking status: Heavy Smoker  ?  Packs/day: 1.00  ?  Years: 54.00  ?  Pack years: 54.00  ?  Types: Cigarettes  ? Smokeless tobacco: Never  ?Vaping Use  ? Vaping Use: Never used  ?Substance Use Topics  ? Alcohol use: Not Currently  ?  Comment: quit drinking at 19  ? Drug use: Never  ? ? ? ?Medication list has been reviewed and updated. ? ?Current Meds  ?Medication Sig  ? acetaminophen (TYLENOL) 500 MG tablet Take 1,000 mg by mouth every 6 (six) hours as needed for moderate pain.  ? aspirin EC 81 MG EC tablet Take 1 tablet (81 mg total) by mouth daily. Swallow whole.  ? calcitRIOL (ROCALTROL) 0.25 MCG capsule Take 0.25 mcg by mouth daily.  ? carvedilol (COREG) 25 MG tablet Take 25 mg by mouth 2 (two) times daily.  ? Cholecalciferol (D3-1000 PO) Take 2,000 Units by mouth daily.  ? MAGNESIUM PO Take 1,000 mg by mouth daily.  ? Melatonin 10 MG CAPS Take by mouth.  ? Multiple Vitamins-Minerals (MULTIVITAL-M PO) Take 1 tablet by mouth daily.  ? [DISCONTINUED] levothyroxine (EUTHYROX) 112 MCG tablet Take 1 tablet (112 mcg total) by mouth daily before breakfast.  ? [DISCONTINUED] omeprazole (PRILOSEC) 20 MG capsule Take 1 capsule (20 mg total) by mouth daily.  ? [DISCONTINUED] rosuvastatin (CRESTOR) 5 MG tablet Take 1 tablet (5 mg total) by mouth daily.  ? ? ? ?  03/11/2022  ?  1:19 PM 12/27/2021  ? 10:13 AM 09/26/2021  ? 11:18 AM 09/05/2021  ?  2:13 PM  ?GAD 7 : Generalized Anxiety Score  ?Nervous, Anxious, on Edge 0 0 0 0  ?Control/stop worrying 0 0 0 0  ?Worry too much - different things 0 0 1 0  ?Trouble relaxing 0 0 0 0  ?Restless 0 0 0 0  ?Easily annoyed or irritable 1 0 0 0  ?Afraid - awful might happen 0 0 0 0  ?Total GAD 7 Score 1 0 1 0  ?Anxiety Difficulty Not difficult  at all  Not difficult at all Not difficult at all  ? ? ? ?  03/11/2022  ?  1:19 PM  ?Depression screen PHQ 2/9  ?Decreased Interest 0  ?Down, Depressed, Hopeless 0  ?PHQ - 2 Score 0  ?Altered sleeping 3  ?Tired, decreased energy 0  ?Change in appetite 0  ?Feeling bad or failure about yourself  0  ?Moving slowly or fidgety/restless 0  ?Suicidal thoughts 0  ?PHQ-9 Score 3  ?Difficult doing work/chores Not difficult at all  ? ? ?BP Readings from Last 3 Encounters:  ?03/11/22 134/82  ?12/27/21 138/78  ?09/26/21 (!) 148/76  ? ? ?Physical Exam ?Vitals and nursing note reviewed.  ?Constitutional:   ?   General: He is not in acute distress. ?   Appearance: He is well-developed.  ?HENT:  ?   Head: Normocephalic and atraumatic.  ?Neck:  ?   Thyroid: No thyroid mass.  ?  Vascular: No carotid bruit.  ?Cardiovascular:  ?   Rate and Rhythm: Normal rate and regular rhythm.  ?   Pulses: Normal pulses.  ?   Heart sounds: No murmur heard. ?Pulmonary:  ?   Effort: Pulmonary effort is normal. No respiratory distress.  ?   Breath sounds: No wheezing or rhonchi.  ?Musculoskeletal:  ?   Cervical back: Normal range of motion.  ?   Right lower leg: No edema.  ?   Left lower leg: No edema.  ?Lymphadenopathy:  ?   Cervical: No cervical adenopathy.  ?Skin: ?   General: Skin is warm and dry.  ?   Capillary Refill: Capillary refill takes less than 2 seconds.  ?   Findings: No rash.  ?Neurological:  ?   General: No focal deficit present.  ?   Mental Status: He is alert and oriented to person, place, and time.  ?Psychiatric:     ?   Mood and Affect: Mood normal.     ?   Behavior: Behavior normal.  ? ? ?Wt Readings from Last 3 Encounters:  ?03/11/22 198 lb (89.8 kg)  ?12/27/21 195 lb (88.5 kg)  ?09/26/21 193 lb (87.5 kg)  ? ? ?BP 134/82   Pulse 79   Ht _0  (1.803 m)   Wt 198 lb (89.8 kg)   SpO2 97%   BMI 27.62 kg/m?  ? ?Assessment and Plan: ?1. Prediabetes ?We discussed limiting sugary foods like sweet tea, cookies and candy. ?He was already  advised to cut back on potatoes ,etc by nephrology. ?Will obtain A1C next visit ? ?2. Essential hypertension ?Clinically stable exam with well controlled BP on Coreg. ?Tolerating medications without side effects at this

## 2022-03-13 DIAGNOSIS — N184 Chronic kidney disease, stage 4 (severe): Secondary | ICD-10-CM | POA: Diagnosis not present

## 2022-03-13 DIAGNOSIS — E875 Hyperkalemia: Secondary | ICD-10-CM | POA: Diagnosis not present

## 2022-03-13 DIAGNOSIS — N281 Cyst of kidney, acquired: Secondary | ICD-10-CM | POA: Diagnosis not present

## 2022-03-13 DIAGNOSIS — N2581 Secondary hyperparathyroidism of renal origin: Secondary | ICD-10-CM | POA: Diagnosis not present

## 2022-03-13 DIAGNOSIS — J449 Chronic obstructive pulmonary disease, unspecified: Secondary | ICD-10-CM | POA: Diagnosis not present

## 2022-03-13 DIAGNOSIS — R809 Proteinuria, unspecified: Secondary | ICD-10-CM | POA: Diagnosis not present

## 2022-03-13 DIAGNOSIS — D631 Anemia in chronic kidney disease: Secondary | ICD-10-CM | POA: Diagnosis not present

## 2022-03-13 DIAGNOSIS — I10 Essential (primary) hypertension: Secondary | ICD-10-CM | POA: Diagnosis not present

## 2022-03-15 ENCOUNTER — Ambulatory Visit: Payer: Medicare Other | Admitting: Urology

## 2022-03-21 ENCOUNTER — Other Ambulatory Visit
Admission: RE | Admit: 2022-03-21 | Discharge: 2022-03-21 | Disposition: A | Discharge status: Home / Self Care | Payer: Medicare Other | Attending: Urology | Admitting: Urology

## 2022-03-21 ENCOUNTER — Other Ambulatory Visit: Payer: Self-pay | Admitting: *Deleted

## 2022-03-21 DIAGNOSIS — N401 Enlarged prostate with lower urinary tract symptoms: Secondary | ICD-10-CM | POA: Insufficient documentation

## 2022-03-21 DIAGNOSIS — N138 Other obstructive and reflux uropathy: Secondary | ICD-10-CM

## 2022-03-21 LAB — PSA: Prostatic Specific Antigen: 0.27 ng/mL (ref 0.00–4.00)

## 2022-03-21 NOTE — Addendum Note (Signed)
Addended by: Despina Hidden on: 03/21/2022 04:10 PM ? ? Modules accepted: Orders ? ?

## 2022-03-22 ENCOUNTER — Encounter: Payer: Self-pay | Admitting: Urology

## 2022-03-22 ENCOUNTER — Ambulatory Visit: Payer: Medicare Other | Admitting: Urology

## 2022-03-22 VITALS — BP 186/79 | HR 73 | Ht 71.0 in | Wt 196.0 lb

## 2022-03-22 DIAGNOSIS — N138 Other obstructive and reflux uropathy: Secondary | ICD-10-CM

## 2022-03-22 DIAGNOSIS — N401 Enlarged prostate with lower urinary tract symptoms: Secondary | ICD-10-CM

## 2022-03-22 DIAGNOSIS — N184 Chronic kidney disease, stage 4 (severe): Secondary | ICD-10-CM

## 2022-03-22 DIAGNOSIS — N393 Stress incontinence (female) (male): Secondary | ICD-10-CM | POA: Diagnosis not present

## 2022-03-22 LAB — BLADDER SCAN AMB NON-IMAGING

## 2022-03-22 NOTE — Progress Notes (Signed)
? ?03/22/22 ?10:17 AM  ? ?Tyrone Moore ?1951/03/17 ?[id number] ? ?Referring provider:  ?Glean Hess, MD ?[address] ?[address] ?[place],  [place] [address] ?Chief Complaint  ?Patient presents with  ? Follow-up  ?  43mth follow-up  ? ? ?HPI: ?Tyrone Moore is a 71 y.o.male with a personal history of partially of chronic outlet obstruction, stress incontinence, CKD, and left lower pole renal mass who presents today for a 6 month follow-up with IPSS, PSA, and PVR.  ? ?He s/p HoLEP on 07/03/2021. Prostate chips were sent for pathology. Pathology showed benign prostatic tissue with nodular hyperplasia negative for malignancy. 24.5 g was resected. He post-op course was complicated by stroke like symptoms. ?  ?His most recent PSA was 0.27 on 03/21/2022. ? ?He reports today that he has urinary leakage when he bends during the day. He urinates x3 during the nighttime. He is still doing pelvic floor exercises and he has improvement with these exercises.  ? ?He continues to smoke. He reports he tries not to smoke he sometimes just lights the cigarette.  ? ? IPSS   ? ? [place] Name 03/22/22 0900  ?  ?  ?  ? International Prostate Symptom Score  ? How often have you had the sensation of not emptying your bladder? Not at All    ? How often have you had to urinate less than every two hours? About half the time    ? How often have you found you stopped and started again several times when you urinated? Not at All    ? How often have you found it difficult to postpone urination? About half the time    ? How often have you had a weak urinary stream? About half the time    ? How often have you had to strain to start urination? Not at All    ? How many times did you typically get up at night to urinate? 3 Times    ? Total IPSS Score 12    ?  ? Quality of Life due to urinary symptoms  ? If you were to spend the rest of your life with your urinary condition just the way it is now how would you feel about that? Mixed    ? ?  ?  ? ?   ? ? ?Score:  ?1-7 Mild ?8-19 Moderate ?20-35 Severe ? ? ?PMH: ?Past Medical History:  ?Diagnosis Date  ? Acquired hypothyroidism 03/26/2021  ? Anemia in chronic kidney disease 08/15/2021  ? 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  ? 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 Chronic bilateral low back pain without sciatica 03/26/2021  ? S/p 4 lumbar spine surgeries  ? Chronic obstructive pulmonary disease, unspecified ([place]) 07/11/2021  ? CKD (chronic kidney disease) stage 4, GFR 15-29 ml/min (HCC) 03/27/2021  ? COVID-19 virus infection 08/31/2021  ? Essential hypertension 03/26/2021  ? GERD (gastroesophageal reflux disease)   ? Hyperlipidemia   ? Hypertension   ? Mixed hyperlipidemia 03/26/2021  ? Thyroid disease   ? ? ?Surgical History: ?Past Surgical History:  ?Procedure Laterality Date  ? CYSTOSCOPY W/ RETROGRADES Bilateral 07/03/2021  ? Procedure: CYSTOSCOPY WITH RETROGRADE PYELOGRAM;  Surgeon: Hollice Espy, MD;  Location: [facility];  Service: Urology;  Laterality: Bilateral;  ? CYSTOSCOPY WITH LITHOLAPAXY N/A 07/03/2021  ? Procedure: CYSTOSCOPY WITH LITHOLAPAXY;  Surgeon: Hollice Espy, MD;  Location: [facility];  Service: Urology;  Laterality: N/A;  ? HOLEP-LASER ENUCLEATION OF THE PROSTATE WITH  MORCELLATION N/A 07/03/2021  ? Procedure: HOLEP-LASER ENUCLEATION OF THE PROSTATE WITH MORCELLATION;  Surgeon: Hollice Espy, MD;  Location: [facility];  Service: Urology;  Laterality: N/A;  ? SPINE SURGERY  2015  ? X 4 surgery - lumbar disc  ? ? ?Home Medications:  ?Allergies as of 03/22/2022   ?No Known Allergies ?  ? ?  ?Medication List  ?  ? ?  ? Accurate as of Mar 22, 2022 10:17 AM. If you have any questions, ask your nurse or doctor.  ?  ?  ? ?  ? ?acetaminophen 500 MG tablet ?Commonly known as: TYLENOL ?Take 1,000 mg by mouth every 6 (six) hours as needed for moderate pain. ?  ?aspirin 81 MG EC tablet ?Take 1 tablet (81 mg total) by mouth daily. Swallow whole. ?  ?calcitRIOL 0.25 MCG  capsule ?Commonly known as: ROCALTROL ?Take 0.25 mcg by mouth daily. ?  ?carvedilol 25 MG tablet ?Commonly known as: COREG ?Take 25 mg by mouth 2 (two) times daily. ?  ?D3-1000 PO ?Take 2,000 Units by mouth daily. ?  ?levothyroxine 112 MCG tablet ?Commonly known as: Euthyrox ?Take 1 tablet (112 mcg total) by mouth daily before breakfast. ?  ?MAGNESIUM PO ?Take 1,000 mg by mouth daily. ?  ?Melatonin 10 MG Caps ?Take by mouth. ?  ?MULTIVITAL-M PO ?Take 1 tablet by mouth daily. ?  ?omeprazole 20 MG capsule ?Commonly known as: PRILOSEC ?Take 1 capsule (20 mg total) by mouth daily. ?  ?rosuvastatin 5 MG tablet ?Commonly known as: Crestor ?Take 1 tablet (5 mg total) by mouth daily. ?  ? ?  ? ? ?Allergies:  ?No Known Allergies ? ?Family History: ?Family History  ?Problem Relation Age of Onset  ? Hypertension Mother   ? Heart disease Mother   ? Heart disease Sister   ? Hypertension Sister   ? Prostate cancer Neg Hx   ? Bladder Cancer Neg Hx   ? Kidney cancer Neg Hx   ? ? ?Social History:  reports that he has been smoking cigarettes. He has a 54.00 pack-year smoking history. He has been exposed to tobacco smoke. He has never used smokeless tobacco. He reports that he does not currently use alcohol. He reports that he does not use drugs. ? ? ?Physical Exam: ?BP (!) 186/79   Pulse 73   Ht 5\' 11"  (1.803 m)   Wt 196 lb (88.9 kg)   BMI 27.34 kg/m?   ?Constitutional:  Alert and oriented, No acute distress. ?HEENT: [place] AT, moist mucus membranes.  Trachea midline, no masses. ?Cardiovascular: No clubbing, cyanosis, or edema. ?Respiratory: Normal respiratory effort, no increased work of breathing. ?Skin: No rashes, bruises or suspicious lesions. ?Neurologic: Grossly intact, no focal deficits, moving all 4 extremities. ?Psychiatric: Normal mood and affect.  ?Pack of cigarretes in left pocket  ? ?  ?Laboratory Data: ? ?Lab Results  ?Component Value Date  ? CREATININE 4.4 (A) 02/13/2022  ? ?Lab Results  ?Component Value Date  ? HGBA1C  6.0 (H) 08/31/2021  ? ? ?Pertinent Imaging: ?Results for orders placed or performed in visit on 03/22/22  ?BLADDER SCAN AMB NON-IMAGING  ?Result Value Ref Range  ? Scan Result 25ml   ? ? ?Assessment & Plan:   ? ?1. CKD stage IV ?- Renal function worsening  ?- no obstruction  ? ?2. Stress urinary incontinence ?- Mildly improving  ?- He continue to do his pelvic floor exercises but does not think he is doing them correctly  ?- Referral  sent to physical therapy for assessment/ teaching ? ?3. BPH with obstruction/LUTS  ?- Emptying beautiful today with improvement in his urinary symptoms. ?-no longer on BPH meds ? ?4. Smoking cessation  ?- Encouraged him again today to stop smoking discussed how it can affect bladder health.  ? ?F/u 1 year IPSS/ PVR ? ?I,Kailey Littlejohn,acting as a Education administrator for Hollice Espy, MD.,have documented all relevant documentation on the behalf of Hollice Espy, MD,as directed by  Hollice Espy, MD while in the presence of Hollice Espy, MD. ? ?I have reviewed the above documentation for accuracy and completeness, and I agree with the above.  ? ?Hollice Espy, MD ? ? ?[facility] ?[address] ?[place], [place] [address] ?[phone] ? ?

## 2022-04-17 DIAGNOSIS — I10 Essential (primary) hypertension: Secondary | ICD-10-CM | POA: Diagnosis not present

## 2022-04-17 DIAGNOSIS — J449 Chronic obstructive pulmonary disease, unspecified: Secondary | ICD-10-CM | POA: Diagnosis not present

## 2022-04-17 DIAGNOSIS — R809 Proteinuria, unspecified: Secondary | ICD-10-CM | POA: Diagnosis not present

## 2022-04-17 DIAGNOSIS — N281 Cyst of kidney, acquired: Secondary | ICD-10-CM | POA: Diagnosis not present

## 2022-04-17 DIAGNOSIS — N2581 Secondary hyperparathyroidism of renal origin: Secondary | ICD-10-CM | POA: Diagnosis not present

## 2022-04-17 DIAGNOSIS — N184 Chronic kidney disease, stage 4 (severe): Secondary | ICD-10-CM | POA: Diagnosis not present

## 2022-04-17 DIAGNOSIS — E875 Hyperkalemia: Secondary | ICD-10-CM | POA: Diagnosis not present

## 2022-04-17 DIAGNOSIS — D631 Anemia in chronic kidney disease: Secondary | ICD-10-CM | POA: Diagnosis not present

## 2022-05-24 ENCOUNTER — Other Ambulatory Visit: Payer: Self-pay | Admitting: Internal Medicine

## 2022-05-24 NOTE — Telephone Encounter (Signed)
Requested medication (s) are due for refill today: yes  Requested medication (s) are on the active medication list: yes  Last refill:  01/31/22   Future visit scheduled: yes  Notes to clinic:  historical med.      Requested Prescriptions  Pending Prescriptions Disp Refills   carvedilol (COREG) 25 MG tablet [Pharmacy Med Name: Carvedilol 25 MG Oral Tablet] 60 tablet 0    Sig: TAKE 1 TABLET BY MOUTH TWICE DAILY WITH A MEAL     Cardiovascular: Beta Blockers 3 Failed - 05/24/2022  9:38 AM      Failed - Cr in normal range and within 360 days    Creatinine  Date Value Ref Range Status  02/13/2022 4.4 (A) 0.6 - 1.3 Final   Creatinine, Ser  Date Value Ref Range Status  09/01/2021 3.53 (H) 0.61 - 1.24 mg/dL Final         Failed - Last BP in normal range    BP Readings from Last 1 Encounters:  03/22/22 (!) 186/79         Passed - AST in normal range and within 360 days    AST  Date Value Ref Range Status  12/27/2021 18 0 - 40 IU/L Final         Passed - ALT in normal range and within 360 days    ALT  Date Value Ref Range Status  12/27/2021 13 0 - 44 IU/L Final         Passed - Last Heart Rate in normal range    Pulse Readings from Last 1 Encounters:  03/22/22 73         Passed - Valid encounter within last 6 months    Recent Outpatient Visits           2 months ago Prediabetes   [facility] Glean Hess, [facility]   4 months ago Essential hypertension   [facility] Glean Hess, MD   8 months ago Essential hypertension   [facility] Glean Hess, MD   8 months ago Brainstem stroke [facility])   [facility] Glean Hess, MD   1 year ago Essential hypertension   [facility] Glean Hess, MD       Future Appointments             In 1 month Army Melia Jesse Sans, MD [facility], [place]   In 3 months Army Melia Jesse Sans, MD [facility], [place]   In 10 months Hollice Espy, [place]

## 2022-05-29 DIAGNOSIS — N184 Chronic kidney disease, stage 4 (severe): Secondary | ICD-10-CM | POA: Diagnosis not present

## 2022-05-29 DIAGNOSIS — I10 Essential (primary) hypertension: Secondary | ICD-10-CM | POA: Diagnosis not present

## 2022-05-29 DIAGNOSIS — N2581 Secondary hyperparathyroidism of renal origin: Secondary | ICD-10-CM | POA: Diagnosis not present

## 2022-05-29 DIAGNOSIS — E875 Hyperkalemia: Secondary | ICD-10-CM | POA: Diagnosis not present

## 2022-05-29 DIAGNOSIS — J449 Chronic obstructive pulmonary disease, unspecified: Secondary | ICD-10-CM | POA: Diagnosis not present

## 2022-05-29 DIAGNOSIS — R809 Proteinuria, unspecified: Secondary | ICD-10-CM | POA: Diagnosis not present

## 2022-05-29 DIAGNOSIS — N281 Cyst of kidney, acquired: Secondary | ICD-10-CM | POA: Diagnosis not present

## 2022-05-29 DIAGNOSIS — D631 Anemia in chronic kidney disease: Secondary | ICD-10-CM | POA: Diagnosis not present

## 2022-06-04 DIAGNOSIS — N184 Chronic kidney disease, stage 4 (severe): Secondary | ICD-10-CM | POA: Diagnosis not present

## 2022-06-11 ENCOUNTER — Ambulatory Visit
Admission: RE | Admit: 2022-06-11 | Discharge: 2022-06-11 | Disposition: A | Discharge status: Home / Self Care | Payer: Medicare Other | Source: Ambulatory Visit | Attending: Urology | Admitting: Urology

## 2022-06-11 DIAGNOSIS — N281 Cyst of kidney, acquired: Secondary | ICD-10-CM | POA: Insufficient documentation

## 2022-06-13 ENCOUNTER — Telehealth: Payer: Self-pay | Admitting: *Deleted

## 2022-06-13 NOTE — Telephone Encounter (Addendum)
Attempted to call, unable to leave VM-not setup   ----- Message from Hollice Espy, MD sent at 06/12/2022  1:45 PM EDT ----- Renal ultrasound reviewed.  Exam is unremarkable.  No structural issues of the kidney.  Hollice Espy, MD

## 2022-06-13 NOTE — Telephone Encounter (Signed)
Pt calls triage line, he states he is returning a call. Advised pt on renal scan results. Pt voiced understanding.

## 2022-06-18 ENCOUNTER — Other Ambulatory Visit: Payer: Self-pay | Admitting: Internal Medicine

## 2022-06-18 NOTE — Telephone Encounter (Signed)
Requested Prescriptions  Pending Prescriptions Disp Refills  . carvedilol (COREG) 25 MG tablet [Pharmacy Med Name: Carvedilol 25 MG Oral Tablet] 60 tablet 0    Sig: TAKE 1 TABLET BY MOUTH TWICE DAILY WITH A MEAL     Cardiovascular: Beta Blockers 3 Failed - 06/18/2022  9:40 AM      Failed - Cr in normal range and within 360 days    Creatinine  Date Value Ref Range Status  02/13/2022 4.4 (A) 0.6 - 1.3 Final   Creatinine, Ser  Date Value Ref Range Status  09/01/2021 3.53 (H) 0.61 - 1.24 mg/dL Final         Failed - Last BP in normal range    BP Readings from Last 1 Encounters:  03/22/22 (!) 186/79         Passed - AST in normal range and within 360 days    AST  Date Value Ref Range Status  12/27/2021 18 0 - 40 IU/L Final         Passed - ALT in normal range and within 360 days    ALT  Date Value Ref Range Status  12/27/2021 13 0 - 44 IU/L Final         Passed - Last Heart Rate in normal range    Pulse Readings from Last 1 Encounters:  03/22/22 73         Passed - Valid encounter within last 6 months    Recent Outpatient Visits          3 months ago Prediabetes   [facility] Glean Hess, MD   5 months ago Essential hypertension   [place], Laura H, MD   8 months ago Essential hypertension   [facility] Glean Hess, MD   9 months ago Brainstem stroke [facility])   [facility] Glean Hess, MD   1 year ago Essential hypertension   [facility] Glean Hess, MD      Future Appointments            In 1 week Army Melia Jesse Sans, MD [facility], [place]   In 3 months Army Melia Jesse Sans, MD [facility], [place]   In 9 months Hollice Espy, [place]

## 2022-06-27 ENCOUNTER — Ambulatory Visit (INDEPENDENT_AMBULATORY_CARE_PROVIDER_SITE_OTHER): Payer: Medicare Other | Admitting: Internal Medicine

## 2022-06-27 ENCOUNTER — Encounter: Payer: Self-pay | Admitting: Internal Medicine

## 2022-06-27 VITALS — BP 132/66 | HR 76 | Ht 71.0 in | Wt 199.0 lb

## 2022-06-27 DIAGNOSIS — N184 Chronic kidney disease, stage 4 (severe): Secondary | ICD-10-CM

## 2022-06-27 DIAGNOSIS — I10 Essential (primary) hypertension: Secondary | ICD-10-CM | POA: Diagnosis not present

## 2022-06-27 DIAGNOSIS — R7303 Prediabetes: Secondary | ICD-10-CM

## 2022-06-27 MED ORDER — CARVEDILOL 25 MG PO TABS: 25.0000 mg | ORAL_TABLET | Freq: Two times a day (BID) | ORAL | 1 refills | Status: DC

## 2022-06-27 NOTE — Progress Notes (Signed)
Date:  06/27/2022   Name:  Tyrone Moore   DOB:  February 04, 1951   MRN:  [id number]   Chief Complaint: Hypertension and Prediabetes  Hypertension This is a chronic problem. The problem is controlled. Pertinent negatives include no chest pain, headaches, palpitations or shortness of breath. Past treatments include beta blockers. Hypertensive end-organ damage includes kidney disease. There is no history of CAD/MI or CVA.  Diabetes He presents for his follow-up diabetic visit. Diabetes type: prediab. His disease course has been stable. Pertinent negatives for hypoglycemia include no dizziness or headaches. Pertinent negatives for diabetes include no chest pain, no fatigue and no weakness. Pertinent negatives for diabetic complications include no CVA.    Lab Results  Component Value Date   NA 141 02/13/2022   K 5.2 (A) 02/13/2022   CO2 27 (A) 02/13/2022   GLUCOSE 98 09/01/2021   BUN 48 (A) 02/13/2022   CREATININE 4.4 (A) 02/13/2022   CALCIUM 8.7 (L) 09/01/2021   EGFR 14 02/13/2022   GFRNONAA 18 (L) 09/01/2021   Lab Results  Component Value Date   CHOL 145 12/27/2021   HDL 36 (L) 12/27/2021   LDLCALC 87 12/27/2021   TRIG 122 12/27/2021   CHOLHDL 4.0 12/27/2021   Lab Results  Component Value Date   TSH 2.870 12/27/2021   Lab Results  Component Value Date   HGBA1C 6.0 (H) 08/31/2021   Lab Results  Component Value Date   WBC 8.2 02/13/2022   HGB 12.3 (A) 02/13/2022   HCT 36 (A) 02/13/2022   MCV 100.0 08/31/2021   PLT 162 02/13/2022   Lab Results  Component Value Date   ALT 13 12/27/2021   AST 18 12/27/2021   ALKPHOS 105 12/27/2021   BILITOT 0.2 12/27/2021   No results found for: "25OHVITD2", "25OHVITD3", "VD25OH"   Review of Systems  Constitutional:  Negative for fatigue and unexpected weight change.  HENT:  Negative for nosebleeds.   Eyes:  Negative for visual disturbance.  Respiratory:  Negative for cough, chest tightness, shortness of breath and wheezing.    Cardiovascular:  Negative for chest pain, palpitations and leg swelling.  Gastrointestinal:  Negative for abdominal pain, constipation and diarrhea.  Neurological:  Negative for dizziness, weakness, light-headedness and headaches.    Patient Active Problem List   Diagnosis Date Noted   History of stroke 09/01/2021   Anemia in chronic kidney disease 08/15/2021   Cyst of kidney, acquired 08/15/2021   Hyperparathyroidism due to renal insufficiency ([place]) 08/15/2021   Chronic obstructive pulmonary disease, unspecified ([place]) 07/11/2021   Proteinuria, unspecified 05/16/2021   CKD (chronic kidney disease) stage 4, GFR 15-29 ml/min ([place]) 03/27/2021   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   Mixed hyperlipidemia 03/26/2021   Acquired hypothyroidism 03/26/2021   Essential hypertension 03/26/2021   Tobacco use disorder 03/26/2021   Chronic bilateral low back pain without sciatica 03/26/2021    No Known Allergies  Past Surgical History:  Procedure Laterality Date   CYSTOSCOPY W/ RETROGRADES Bilateral 07/03/2021   Procedure: CYSTOSCOPY WITH RETROGRADE PYELOGRAM;  Surgeon: Hollice Espy, MD;  Location: [facility] ORS;  Service: Urology;  Laterality: Bilateral;   CYSTOSCOPY WITH LITHOLAPAXY N/A 07/03/2021   Procedure: CYSTOSCOPY WITH LITHOLAPAXY;  Surgeon: Hollice Espy, MD;  Location: [facility] ORS;  Service: Urology;  Laterality: N/A;   HOLEP-LASER ENUCLEATION OF THE PROSTATE WITH MORCELLATION N/A 07/03/2021   Procedure: HOLEP-LASER ENUCLEATION OF THE PROSTATE WITH MORCELLATION;  Surgeon: Hollice Espy, MD;  Location: [facility] ORS;  Service: Urology;  Laterality: N/A;   SPINE SURGERY  2015   X 4 surgery - lumbar disc    Social History   Tobacco Use   Smoking status: Heavy Smoker    Packs/day: 1.00    Years: 54.00    Total pack years: 54.00    Types: Cigarettes    Passive exposure: Current   Smokeless tobacco: Never  Vaping Use   Vaping Use: Never used  Substance Use  Topics   Alcohol use: Not Currently    Comment: quit drinking at 19   Drug use: Never     Medication list has been reviewed and updated.  Current Meds  Medication Sig   acetaminophen (TYLENOL) 500 MG tablet Take 1,000 mg by mouth every 6 (six) hours as needed for moderate pain.   aspirin EC 81 MG EC tablet Take 1 tablet (81 mg total) by mouth daily. Swallow whole.   calcitRIOL (ROCALTROL) 0.25 MCG capsule Take 0.25 mcg by mouth 2 (two) times a week.   carvedilol (COREG) 25 MG tablet TAKE 1 TABLET BY MOUTH TWICE DAILY WITH A MEAL   Cholecalciferol (D3-1000 PO) Take 2,000 Units by mouth daily.   levothyroxine (EUTHYROX) 112 MCG tablet Take 1 tablet (112 mcg total) by mouth daily before breakfast.   MAGNESIUM PO Take 1,000 mg by mouth daily.   Melatonin 10 MG CAPS Take by mouth.   Multiple Vitamins-Minerals (MULTIVITAL-M PO) Take 1 tablet by mouth daily.   omeprazole (PRILOSEC) 20 MG capsule Take 1 capsule (20 mg total) by mouth daily.   rosuvastatin (CRESTOR) 5 MG tablet Take 1 tablet (5 mg total) by mouth daily.       06/27/2022   10:04 AM 03/11/2022    1:19 PM 12/27/2021   10:13 AM 09/26/2021   11:18 AM  GAD 7 : Generalized Anxiety Score  Nervous, Anxious, on Edge 2 0 0 0  Control/stop worrying 2 0 0 0  Worry too much - different things 2 0 0 1  Trouble relaxing 2 0 0 0  Restless 2 0 0 0  Easily annoyed or irritable 2 1 0 0  Afraid - awful might happen 2 0 0 0  Total GAD 7 Score 14 1 0 1  Anxiety Difficulty Very difficult Not difficult at all  Not difficult at all       06/27/2022   10:04 AM 03/11/2022    1:19 PM 12/27/2021   10:13 AM  Depression screen PHQ 2/9  Decreased Interest 2 0 0  Down, Depressed, Hopeless 2 0 0  PHQ - 2 Score 4 0 0  Altered sleeping 2 3 1   Tired, decreased energy 2 0 1  Change in appetite 2 0 0  Feeling bad or failure about yourself  2 0 0  Trouble concentrating 2  0  Moving slowly or fidgety/restless 2 0 0  Suicidal thoughts 2 0 0  PHQ-9  Score 18 3 2   Difficult doing work/chores Very difficult Not difficult at all Not difficult at all    BP Readings from Last 3 Encounters:  06/27/22 132/66  03/22/22 (!) 186/79  03/11/22 134/82    Physical Exam Vitals and nursing note reviewed.  Constitutional:      General: He is not in acute distress.    Appearance: Normal appearance. He is well-developed.  HENT:     Head: Normocephalic and atraumatic.  Neck:     Vascular: No carotid bruit.  Cardiovascular:     Rate and Rhythm: Normal rate and  regular rhythm.     Pulses: Normal pulses.  Pulmonary:     Effort: Pulmonary effort is normal. No respiratory distress.     Breath sounds: No wheezing or rhonchi.  Musculoskeletal:        General: No swelling.     Cervical back: Normal range of motion.  Lymphadenopathy:     Cervical: No cervical adenopathy.  Skin:    General: Skin is warm and dry.     Findings: No rash.  Neurological:     General: No focal deficit present.     Mental Status: He is alert and oriented to person, place, and time.  Psychiatric:        Mood and Affect: Mood normal.        Behavior: Behavior normal.     Wt Readings from Last 3 Encounters:  06/27/22 199 lb (90.3 kg)  03/22/22 196 lb (88.9 kg)  03/11/22 198 lb (89.8 kg)    BP 132/66   Pulse 76   Ht 5' 11"  (1.803 m)   Wt 199 lb (90.3 kg)   SpO2 96%   BMI 27.75 kg/m   Assessment and Plan: 1. Essential hypertension Clinically stable exam with well controlled BP. Tolerating medications without side effects at this time. Pt to continue current regimen and low sodium diet; benefits of regular exercise as able discussed. - carvedilol (COREG) 25 MG tablet; Take 1 tablet (25 mg total) by mouth 2 (two) times daily with a meal.  Dispense: 200 tablet; Refill: 1  2. Prediabetes Recent fasting labs at Nephrology have been < 100. Will defer A1C today  3. CKD (chronic kidney disease) stage 4, GFR 15-29 ml/min [facility]) Being followed closely by  Nephrology Starting discussion regarding dialysis   Partially dictated using [place]. Any errors are unintentional.  Halina Maidens, MD [facility]  06/27/2022

## 2022-06-28 ENCOUNTER — Ambulatory Visit: Payer: Medicare Other | Admitting: Urology

## 2022-07-10 DIAGNOSIS — N2581 Secondary hyperparathyroidism of renal origin: Secondary | ICD-10-CM | POA: Diagnosis not present

## 2022-07-10 DIAGNOSIS — J449 Chronic obstructive pulmonary disease, unspecified: Secondary | ICD-10-CM | POA: Diagnosis not present

## 2022-07-10 DIAGNOSIS — N281 Cyst of kidney, acquired: Secondary | ICD-10-CM | POA: Diagnosis not present

## 2022-07-10 DIAGNOSIS — D631 Anemia in chronic kidney disease: Secondary | ICD-10-CM | POA: Diagnosis not present

## 2022-07-10 DIAGNOSIS — I10 Essential (primary) hypertension: Secondary | ICD-10-CM | POA: Diagnosis not present

## 2022-07-10 DIAGNOSIS — R809 Proteinuria, unspecified: Secondary | ICD-10-CM | POA: Diagnosis not present

## 2022-07-10 DIAGNOSIS — N184 Chronic kidney disease, stage 4 (severe): Secondary | ICD-10-CM | POA: Diagnosis not present

## 2022-07-10 DIAGNOSIS — E875 Hyperkalemia: Secondary | ICD-10-CM | POA: Diagnosis not present

## 2022-07-22 ENCOUNTER — Ambulatory Visit: Payer: Medicare Other

## 2022-07-26 ENCOUNTER — Ambulatory Visit: Payer: Medicare Other

## 2022-07-26 NOTE — Patient Instructions (Signed)
Health Maintenance, Male Adopting a healthy lifestyle and getting preventive care are important in promoting health and wellness. Ask your health care provider about: The right schedule for you to have regular tests and exams. Things you can do on your own to prevent diseases and keep yourself healthy. What should I know about diet, weight, and exercise? Eat a healthy diet  Eat a diet that includes plenty of vegetables, fruits, low-fat dairy products, and lean protein. Do not eat a lot of foods that are high in solid fats, added sugars, or sodium. Maintain a healthy weight Body mass index (BMI) is a measurement that can be used to identify possible weight problems. It estimates body fat based on height and weight. Your health care provider can help determine your BMI and help you achieve or maintain a healthy weight. Get regular exercise Get regular exercise. This is one of the most important things you can do for your health. Most adults should: Exercise for at least 150 minutes each week. The exercise should increase your heart rate and make you sweat (moderate-intensity exercise). Do strengthening exercises at least twice a week. This is in addition to the moderate-intensity exercise. Spend less time sitting. Even light physical activity can be beneficial. Watch cholesterol and blood lipids Have your blood tested for lipids and cholesterol at 71 years of age, then have this test every 5 years. You may need to have your cholesterol levels checked more often if: Your lipid or cholesterol levels are high. You are older than 71 years of age. You are at high risk for heart disease. What should I know about cancer screening? Many types of cancers can be detected early and may often be prevented. Depending on your health history and family history, you may need to have cancer screening at various ages. This may include screening for: Colorectal cancer. Prostate cancer. Skin cancer. Lung  cancer. What should I know about heart disease, diabetes, and high blood pressure? Blood pressure and heart disease High blood pressure causes heart disease and increases the risk of stroke. This is more likely to develop in people who have high blood pressure readings or are overweight. Talk with your health care provider about your target blood pressure readings. Have your blood pressure checked: Every 3-5 years if you are 18-39 years of age. Every year if you are 40 years old or older. If you are between the ages of 65 and 75 and are a current or former smoker, ask your health care provider if you should have a one-time screening for abdominal aortic aneurysm (AAA). Diabetes Have regular diabetes screenings. This checks your fasting blood sugar level. Have the screening done: Once every three years after age 45 if you are at a normal weight and have a low risk for diabetes. More often and at a younger age if you are overweight or have a high risk for diabetes. What should I know about preventing infection? Hepatitis B If you have a higher risk for hepatitis B, you should be screened for this virus. Talk with your health care provider to find out if you are at risk for hepatitis B infection. Hepatitis C Blood testing is recommended for: Everyone born from 1945 through 1965. Anyone with known risk factors for hepatitis C. Sexually transmitted infections (STIs) You should be screened each year for STIs, including gonorrhea and chlamydia, if: You are sexually active and are younger than 71 years of age. You are older than 71 years of age and your   health care provider tells you that you are at risk for this type of infection. Your sexual activity has changed since you were last screened, and you are at increased risk for chlamydia or gonorrhea. Ask your health care provider if you are at risk. Ask your health care provider about whether you are at high risk for HIV. Your health care provider  may recommend a prescription medicine to help prevent HIV infection. If you choose to take medicine to prevent HIV, you should first get tested for HIV. You should then be tested every 3 months for as long as you are taking the medicine. Follow these instructions at home: Alcohol use Do not drink alcohol if your health care provider tells you not to drink. If you drink alcohol: Limit how much you have to 0-2 drinks a day. Know how much alcohol is in your drink. In the [place] one drink equals one 12 oz bottle of beer (355 mL), one 5 oz glass of wine (148 mL), or one 1 oz glass of hard liquor (44 mL). Lifestyle Do not use any products that contain nicotine or tobacco. These products include cigarettes, chewing tobacco, and vaping devices, such as e-cigarettes. If you need help quitting, ask your health care provider. Do not use street drugs. Do not share needles. Ask your health care provider for help if you need support or information about quitting drugs. General instructions Schedule regular health, dental, and eye exams. Stay current with your vaccines. Tell your health care provider if: You often feel depressed. You have ever been abused or do not feel safe at home. Summary Adopting a healthy lifestyle and getting preventive care are important in promoting health and wellness. Follow your health care provider's instructions about healthy diet, exercising, and getting tested or screened for diseases. Follow your health care provider's instructions on monitoring your cholesterol and blood pressure. This information is not intended to replace advice given to you by your health care provider. Make sure you discuss any questions you have with your health care provider. Document Revised: 03/19/2021 Document Reviewed: 03/19/2021 Elsevier Patient Education  2023 [employer]  

## 2022-07-26 NOTE — Progress Notes (Signed)
I contacted the patient to initiate his Medicare Wellness Visit. The patient answered the phone and asked if I could call him back in 10 minutes. I attempted to contact the patient back three times, and each time my call was forwarded to a voicemail that was full.

## 2022-07-29 DIAGNOSIS — N281 Cyst of kidney, acquired: Secondary | ICD-10-CM | POA: Diagnosis not present

## 2022-07-29 DIAGNOSIS — N184 Chronic kidney disease, stage 4 (severe): Secondary | ICD-10-CM | POA: Diagnosis not present

## 2022-07-29 DIAGNOSIS — N2581 Secondary hyperparathyroidism of renal origin: Secondary | ICD-10-CM | POA: Diagnosis not present

## 2022-07-29 DIAGNOSIS — J449 Chronic obstructive pulmonary disease, unspecified: Secondary | ICD-10-CM | POA: Diagnosis not present

## 2022-07-31 DIAGNOSIS — E875 Hyperkalemia: Secondary | ICD-10-CM | POA: Diagnosis not present

## 2022-07-31 DIAGNOSIS — D631 Anemia in chronic kidney disease: Secondary | ICD-10-CM | POA: Diagnosis not present

## 2022-07-31 DIAGNOSIS — J449 Chronic obstructive pulmonary disease, unspecified: Secondary | ICD-10-CM | POA: Diagnosis not present

## 2022-07-31 DIAGNOSIS — R809 Proteinuria, unspecified: Secondary | ICD-10-CM | POA: Diagnosis not present

## 2022-07-31 DIAGNOSIS — N281 Cyst of kidney, acquired: Secondary | ICD-10-CM | POA: Diagnosis not present

## 2022-07-31 DIAGNOSIS — I10 Essential (primary) hypertension: Secondary | ICD-10-CM | POA: Diagnosis not present

## 2022-07-31 DIAGNOSIS — N184 Chronic kidney disease, stage 4 (severe): Secondary | ICD-10-CM | POA: Diagnosis not present

## 2022-07-31 DIAGNOSIS — N2581 Secondary hyperparathyroidism of renal origin: Secondary | ICD-10-CM | POA: Diagnosis not present

## 2022-08-21 ENCOUNTER — Ambulatory Visit (INDEPENDENT_AMBULATORY_CARE_PROVIDER_SITE_OTHER): Payer: Medicare Other

## 2022-08-21 VITALS — Ht 71.0 in

## 2022-08-21 DIAGNOSIS — Z0000 Encounter for general adult medical examination without abnormal findings: Secondary | ICD-10-CM | POA: Diagnosis not present

## 2022-08-21 DIAGNOSIS — Z0100 Encounter for examination of eyes and vision without abnormal findings: Secondary | ICD-10-CM

## 2022-08-21 NOTE — Progress Notes (Signed)
Subjective:   Tyrone Moore is a 71 y.o. male who presents for Medicare Annual/Subsequent preventive examination.  I connected with  Tyrone Moore on 08/21/22 by a audio enabled telemedicine application and verified that I am speaking with the correct person using two identifiers.  Patient Location: Other:  HOME  Provider Location: Office/Clinic  I discussed the limitations of evaluation and management by telemedicine. The patient expressed understanding and agreed to proceed.    Review of Systems    Defer to PCP Cardiac Risk Factors include: advanced age (>8men, >64 women);male gender     Objective:    Today's Vitals   08/21/22 1000 08/21/22 1013  Height: 5\' 11"  (1.803 m)   PainSc: 0-No pain 0-No pain   Body mass index is 27.75 kg/m.     08/21/2022   10:14 AM 08/31/2021    9:40 AM 07/18/2021    1:38 PM 07/08/2021   10:04 AM 06/21/2021   10:26 AM  Advanced Directives  Does Patient Have a Medical Advance Directive? Yes No Yes No No  Type of Advance Directive Living will;Healthcare Power of [place];Living will    Copy of [place] in Chart? No - copy requested  No - copy requested    Would patient like information on creating a medical advance directive?  No - Patient declined   No - Patient declined    Current Medications (verified) Outpatient Encounter Medications as of 08/21/2022  Medication Sig   acetaminophen (TYLENOL) 500 MG tablet Take 1,000 mg by mouth every 6 (six) hours as needed for moderate pain.   aspirin EC 81 MG EC tablet Take 1 tablet (81 mg total) by mouth daily. Swallow whole.   calcitRIOL (ROCALTROL) 0.25 MCG capsule Take 0.25 mcg by mouth 2 (two) times a week.   carvedilol (COREG) 25 MG tablet Take 1 tablet (25 mg total) by mouth 2 (two) times daily with a meal.   Cholecalciferol (D3-1000 PO) Take 2,000 Units by mouth daily.   levothyroxine (EUTHYROX) 112 MCG tablet Take 1 tablet (112 mcg total) by  mouth daily before breakfast.   MAGNESIUM PO Take 1,000 mg by mouth daily.   Melatonin 10 MG CAPS Take by mouth.   Multiple Vitamins-Minerals (MULTIVITAL-M PO) Take 1 tablet by mouth daily.   omeprazole (PRILOSEC) 20 MG capsule Take 1 capsule (20 mg total) by mouth daily.   rosuvastatin (CRESTOR) 5 MG tablet Take 1 tablet (5 mg total) by mouth daily.   sodium bicarbonate 650 MG tablet Take by mouth.   No facility-administered encounter medications on file as of 08/21/2022.    Allergies (verified) Patient has no known allergies.   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   Chronic bilateral low back pain without sciatica 03/26/2021   S/p 4 lumbar spine surgeries   Chronic obstructive pulmonary disease, unspecified ([place]) 07/11/2021   CKD (chronic kidney disease) stage 4, GFR 15-29 ml/min ([place]) 03/27/2021   COVID-19 virus infection 08/31/2021   Essential hypertension 03/26/2021   GERD (gastroesophageal reflux disease)    Hyperlipidemia    Hypertension    Mixed hyperlipidemia 03/26/2021   Thyroid disease    Past Surgical History:  Procedure Laterality Date   CYSTOSCOPY W/ RETROGRADES Bilateral 07/03/2021   Procedure: CYSTOSCOPY WITH RETROGRADE PYELOGRAM;  Surgeon: Hollice Espy,  MD;  Location: [facility];  Service: Urology;  Laterality: Bilateral;   CYSTOSCOPY WITH LITHOLAPAXY N/A 07/03/2021   Procedure: CYSTOSCOPY WITH LITHOLAPAXY;  Surgeon: Hollice Espy, MD;  Location: [facility];  Service: Urology;  Laterality: N/A;   HOLEP-LASER ENUCLEATION OF THE PROSTATE WITH MORCELLATION N/A 07/03/2021   Procedure: HOLEP-LASER ENUCLEATION OF THE PROSTATE WITH MORCELLATION;  Surgeon: Hollice Espy, MD;  Location: [facility];  Service: Urology;  Laterality: N/A;   SPINE SURGERY  2015   X 4 surgery - lumbar  disc   Family History  Problem Relation Age of Onset   Hypertension Mother    Heart disease Mother    Heart disease Sister    Hypertension Sister    Prostate cancer Neg Hx    Bladder Cancer Neg Hx    Kidney cancer Neg Hx    Social History   Socioeconomic History   Marital status: Widowed    Spouse name: Not on file   Number of children: Not on file   Years of education: Not on file   Highest education level: Not on file  Occupational History   Not on file  Tobacco Use   Smoking status: Heavy Smoker    Packs/day: 1.00    Years: 54.00    Total pack years: 54.00    Types: Cigarettes    Passive exposure: Current   Smokeless tobacco: Never  Vaping Use   Vaping Use: Never used  Substance and Sexual Activity   Alcohol use: Not Currently    Comment: quit drinking at 19   Drug use: Never   Sexual activity: Not Currently    Partners: Female    Birth control/protection: None  Other Topics Concern   Not on file  Social History Narrative   Pt lives with son and his family.   Social Determinants of Health   Financial Resource Strain: Low Risk  (06/27/2022)   Overall Financial Resource Strain (CARDIA)    Difficulty of Paying Living Expenses: Not hard at all  Food Insecurity: No Food Insecurity (06/27/2022)   Hunger Vital Sign    Worried About Running Out of Food in the Last Year: Never true    Ran Out of Food in the Last Year: Never true  Transportation Needs: No Transportation Needs (06/27/2022)   PRAPARE - Hydrologist (Medical): No    Lack of Transportation (Non-Medical): No  Physical Activity: Sufficiently Active (08/21/2022)   Exercise Vital Sign    Days of Exercise per Week: 7 days    Minutes of Exercise per Session: 30 min  Stress: No Stress Concern Present (07/18/2021)   [place]    Feeling of Stress : Not at all  Social Connections: Socially Isolated (08/21/2022)    Social Connection and Isolation Panel [NHANES]    Frequency of Communication with Friends and Family: More than three times a week    Frequency of Social Gatherings with Friends and Family: More than three times a week    Attends Religious Services: Never    Marine scientist or Organizations: No    Attends Archivist Meetings: Never    Marital Status: Widowed    Tobacco Counseling Ready to quit: No Counseling given: Yes   Clinical Intake:  Pre-visit preparation completed: Yes  Pain : No/denies pain Pain Score: 0-No pain  Diabetes: No  Information entered by :: Karma Ansley, [place]   Activities of Daily Living  08/21/2022   10:16 AM 03/11/2022    1:20 PM  In your present state of health, do you have any difficulty performing the following activities:  Hearing? 1 1  Vision? 0 0  Difficulty concentrating or making decisions? 0 0  Walking or climbing stairs? 1 1  Dressing or bathing? 0 0  Doing errands, shopping? 1 0  Preparing Food and eating ? N   Using the Toilet? N   In the past six months, have you accidently leaked urine? N   Do you have problems with loss of bowel control? N   Managing your Medications? N   Managing your Finances? N   Housekeeping or managing your Housekeeping? N     Patient Care Team: Glean Hess, MD as PCP - General (Internal Medicine) Hollice Espy, MD as Consulting Physician (Urology) Lyla Son, MD as Consulting Physician (Nephrology)  Indicate any recent Medical Services you may have received from other than [facility] providers in the past year (date may be approximate).     Assessment:   This is a routine wellness examination for Tyrone Moore.  Hearing/Vision screen Hearing Screening - Comments:: Patient has ringing in left ear. Vision Screening - Comments:: Wears prescription glasses.  Dietary issues and exercise activities discussed:     Goals Addressed             This Visit's Progress    Patient  Stated       Drink more water daily.      Depression Screen    08/21/2022   10:15 AM 06/27/2022   10:04 AM 03/11/2022    1:19 PM 12/27/2021   10:13 AM 09/26/2021   11:18 AM 09/05/2021    2:13 PM 07/18/2021    1:37 PM  PHQ 2/9 Scores  PHQ - 2 Score 2 0 0 0 0 0 0  PHQ- 9 Score 2 0 3 2 0 0     Fall Risk    08/21/2022   10:15 AM 06/27/2022   10:05 AM 03/11/2022    1:20 PM 12/27/2021   10:13 AM 09/26/2021   11:18 AM  Fall Risk   Falls in the past year? 0 0 0 0 1  Number falls in past yr: 0 0 0 0 1  Injury with Fall? 0 0 0 0 0  Risk for fall due to : No Fall Risks No Fall Risks No Fall Risks No Fall Risks;History of fall(s) History of fall(s)  Follow up Falls evaluation completed Falls evaluation completed Falls evaluation completed Falls evaluation completed     [place]:  Any stairs in or around the home? No  If so, are there any without handrails?  N/A Home free of loose throw rugs in walkways, pet beds, electrical cords, etc? Yes  Adequate lighting in your home to reduce risk of falls? Yes   ASSISTIVE DEVICES UTILIZED TO PREVENT FALLS:  Life alert? No  Use of a cane, walker or w/c? No  Grab bars in the bathroom? Yes  Shower chair or bench in shower? No  Elevated toilet seat or a handicapped toilet? Yes    Cognitive Function:        08/21/2022   10:16 AM  6CIT Screen  What Year? 0 points  What month? 0 points  What time? 0 points  Count back from 20 0 points  Months in reverse 0 points  Repeat phrase 2 points  Total Score 2 points    Immunizations  There is no immunization history on file for this patient.  TDAP status: Due, Education has been provided regarding the importance of this vaccine. Advised may receive this vaccine at local pharmacy or Health Dept. Aware to provide a copy of the vaccination record if obtained from local pharmacy or Health Dept. Verbalized acceptance and understanding.  Flu Vaccine status:  Declined, Education has been provided regarding the importance of this vaccine but patient still declined. Advised may receive this vaccine at local pharmacy or Health Dept. Aware to provide a copy of the vaccination record if obtained from local pharmacy or Health Dept. Verbalized acceptance and understanding.  Pneumococcal vaccine status: Declined,  Education has been provided regarding the importance of this vaccine but patient still declined. Advised may receive this vaccine at local pharmacy or Health Dept. Aware to provide a copy of the vaccination record if obtained from local pharmacy or Health Dept. Verbalized acceptance and understanding.   Covid-19 vaccine status: Declined, Education has been provided regarding the importance of this vaccine but patient still declined. Advised may receive this vaccine at local pharmacy or Health Dept.or vaccine clinic. Aware to provide a copy of the vaccination record if obtained from local pharmacy or Health Dept. Verbalized acceptance and understanding.  Qualifies for Shingles Vaccine? Yes   Zostavax completed No   Shingrix Completed?: No.    Education has been provided regarding the importance of this vaccine. Patient has been advised to call insurance company to determine out of pocket expense if they have not yet received this vaccine. Advised may also receive vaccine at local pharmacy or Health Dept. Verbalized acceptance and understanding.  Screening Tests Health Maintenance  Topic Date Due   Hepatitis C Screening  Never done   COVID-19 Vaccine (1) 09/06/2022 (Originally 03/11/1952)   Pneumonia Vaccine 70+ Years old (1 - PCV) 09/26/2022 (Originally 09/11/1957)   Zoster Vaccines- Shingrix (1 of 2) 11/21/2022 (Originally 09/11/2001)   INFLUENZA VACCINE  02/09/2023 (Originally 06/11/2022)   COLONOSCOPY (Pts 45-62yrs Insurance coverage will need to be confirmed)  08/22/2023 (Originally 09/11/1996)   TETANUS/TDAP  08/22/2023 (Originally 09/11/1970)   HPV  VACCINES  Aged Out    Health Maintenance  Health Maintenance Due  Topic Date Due   Hepatitis C Screening  Never done   Colorectal Cancer Screening: Patient declined referral to GI for colonoscopy.  Lung Cancer Screening: (Low Dose CT Chest recommended if Age 29-80 years, 30 pack-year currently smoking OR have quit w/in 15years.) DOES qualify.   Lung Cancer Screening Referral: Patient DECLINED.  Additional Screening:  Hepatitis C Screening: does qualify- OVERDUE  Vision Screening: Recommended annual ophthalmology exams for early detection of glaucoma and other disorders of the eye. Is the patient up to date with their annual eye exam?  No. Who is the provider or what is the name of the office in which the patient attends annual eye exams? Needs an eye doctor If pt is not established with a provider, would they like to be referred to a provider to establish care? Yes. Referred to [facility] and Patient agreed.  Dental Screening: Recommended annual dental exams for proper oral hygiene  Community Resource Referral / Chronic Care Management: CRR required this visit?  No   CCM required this visit?  No      Plan:     I have personally reviewed and noted the following in the patient's chart:   Medical and social history Use of alcohol, tobacco or illicit drugs  Current medications and supplements  including opioid prescriptions. Patient is not currently taking opioid prescriptions. Functional ability and status Nutritional status Physical activity Advanced directives List of other physicians Hospitalizations, surgeries, and ER visits in previous 12 months Vitals Screenings to include cognitive, depression, and falls Referrals and appointments  In addition, I have reviewed and discussed with patient certain preventive protocols, quality metrics, and best practice recommendations. A written personalized care plan for preventive services as well as general preventive  health recommendations were provided to patient.     Clista Bernhardt, [place]   08/21/2022     Tyrone Moore , Thank you for taking time to come for your Medicare Wellness Visit. I appreciate your ongoing commitment to your health goals. Please review the following plan we discussed and let me know if I can assist you in the future.   These are the goals we discussed:  Goals      Patient Stated     Drink more water daily.        This is a list of the screening recommended for you and due dates:  Health Maintenance  Topic Date Due   Hepatitis C Screening: USPSTF Recommendation to screen - Ages 76-79 yo.  Never done   COVID-19 Vaccine (1) 09/06/2022*   Pneumonia Vaccine (1 - PCV) 09/26/2022*   Zoster (Shingles) Vaccine (1 of 2) 11/21/2022*   Flu Shot  02/09/2023*   Colon Cancer Screening  08/22/2023*   Tetanus Vaccine  08/22/2023*   HPV Vaccine  Aged Out  *Topic was postponed. The date shown is not the original due date.      Nurse Notes: Declined Lung CT Screening. Referred to Eye doctor for Eye Exam.

## 2022-09-11 DIAGNOSIS — E875 Hyperkalemia: Secondary | ICD-10-CM | POA: Diagnosis not present

## 2022-09-11 DIAGNOSIS — N184 Chronic kidney disease, stage 4 (severe): Secondary | ICD-10-CM | POA: Diagnosis not present

## 2022-09-11 DIAGNOSIS — I10 Essential (primary) hypertension: Secondary | ICD-10-CM | POA: Diagnosis not present

## 2022-09-11 DIAGNOSIS — J449 Chronic obstructive pulmonary disease, unspecified: Secondary | ICD-10-CM | POA: Diagnosis not present

## 2022-09-11 DIAGNOSIS — N281 Cyst of kidney, acquired: Secondary | ICD-10-CM | POA: Diagnosis not present

## 2022-09-11 DIAGNOSIS — R809 Proteinuria, unspecified: Secondary | ICD-10-CM | POA: Diagnosis not present

## 2022-09-11 DIAGNOSIS — D631 Anemia in chronic kidney disease: Secondary | ICD-10-CM | POA: Diagnosis not present

## 2022-09-11 DIAGNOSIS — N2581 Secondary hyperparathyroidism of renal origin: Secondary | ICD-10-CM | POA: Diagnosis not present

## 2022-09-11 LAB — BASIC METABOLIC PANEL
BUN: 45 — AB (ref 4–21)
CO2: 26 — AB (ref 13–22)
Chloride: 112 — AB (ref 99–108)
Creatinine: 4.1 — AB (ref 0.6–1.3)
Potassium: 5.2 mEq/L — AB (ref 3.5–5.1)
Sodium: 142 (ref 137–147)

## 2022-09-11 LAB — COMPREHENSIVE METABOLIC PANEL
Calcium: 9.8 (ref 8.7–10.7)
eGFR: 15

## 2022-09-11 LAB — CBC AND DIFFERENTIAL
HCT: 35 — AB (ref 41–53)
Hemoglobin: 11.9 — AB (ref 13.5–17.5)
Platelets: 156 10*3/uL (ref 150–400)
WBC: 8.4

## 2022-09-17 ENCOUNTER — Ambulatory Visit (INDEPENDENT_AMBULATORY_CARE_PROVIDER_SITE_OTHER): Payer: Medicare Other | Admitting: Internal Medicine

## 2022-09-17 ENCOUNTER — Encounter: Payer: Self-pay | Admitting: Internal Medicine

## 2022-09-17 VITALS — BP 128/78 | HR 75 | Ht 71.0 in | Wt 191.2 lb

## 2022-09-17 DIAGNOSIS — I10 Essential (primary) hypertension: Secondary | ICD-10-CM | POA: Diagnosis not present

## 2022-09-17 DIAGNOSIS — E039 Hypothyroidism, unspecified: Secondary | ICD-10-CM | POA: Diagnosis not present

## 2022-09-17 DIAGNOSIS — K219 Gastro-esophageal reflux disease without esophagitis: Secondary | ICD-10-CM | POA: Diagnosis not present

## 2022-09-17 DIAGNOSIS — R7303 Prediabetes: Secondary | ICD-10-CM | POA: Insufficient documentation

## 2022-09-17 LAB — POCT GLYCOSYLATED HEMOGLOBIN (HGB A1C): Hemoglobin A1C: 5.7 % — AB (ref 4.0–5.6)

## 2022-09-17 MED ORDER — OMEPRAZOLE 20 MG PO CPDR: 20.0000 mg | DELAYED_RELEASE_CAPSULE | Freq: Every day | ORAL | 1 refills | Status: DC

## 2022-09-17 MED ORDER — LEVOTHYROXINE SODIUM 112 MCG PO TABS: 112.0000 ug | ORAL_TABLET | Freq: Every day | ORAL | 1 refills | Status: DC

## 2022-09-17 NOTE — Progress Notes (Signed)
Date:  09/17/2022   Name:  Tyrone Moore   DOB:  1951-10-15   MRN:  [id number]   Chief Complaint: Hypertension and Diabetes  Hypertension Associated symptoms include shortness of breath. Pertinent negatives include no chest pain, headaches or palpitations. Past treatments include beta blockers. The current treatment provides significant improvement. Hypertensive end-organ damage includes kidney disease. There is no history of CAD/MI or CVA.  Diabetes He presents for his follow-up diabetic visit. Diabetes type: prediabetes. His disease course has been stable. Pertinent negatives for hypoglycemia include no dizziness, headaches or tremors. Pertinent negatives for diabetes include no chest pain, no fatigue, no polydipsia and no polyuria. Pertinent negatives for diabetic complications include no CVA. Current diabetic treatment includes diet.  Hyperlipidemia This is a chronic problem. The problem is controlled. Associated symptoms include shortness of breath. Pertinent negatives include no chest pain. Current antihyperlipidemic treatment includes statins. The current treatment provides moderate improvement of lipids.    Lab Results  Component Value Date   NA 142 09/11/2022   K 5.2 (A) 09/11/2022   CO2 26 (A) 09/11/2022   GLUCOSE 98 09/01/2021   BUN 45 (A) 09/11/2022   CREATININE 4.1 (A) 09/11/2022   CALCIUM 9.8 09/11/2022   EGFR 15 09/11/2022   GFRNONAA 18 (L) 09/01/2021   Lab Results  Component Value Date   CHOL 145 12/27/2021   HDL 36 (L) 12/27/2021   LDLCALC 87 12/27/2021   TRIG 122 12/27/2021   CHOLHDL 4.0 12/27/2021   Lab Results  Component Value Date   TSH 2.870 12/27/2021   Lab Results  Component Value Date   HGBA1C 6.0 (H) 08/31/2021   Lab Results  Component Value Date   WBC 8.4 09/11/2022   HGB 11.9 (A) 09/11/2022   HCT 35 (A) 09/11/2022   MCV 100.0 08/31/2021   PLT 156 09/11/2022   Lab Results  Component Value Date   ALT 13 12/27/2021   AST 18 12/27/2021    ALKPHOS 105 12/27/2021   BILITOT 0.2 12/27/2021   No results found for: "25OHVITD2", "25OHVITD3", "VD25OH"   Review of Systems  Constitutional:  Positive for appetite change and unexpected weight change (has lost a few pounds due to decreased appetite). Negative for fatigue.  HENT:  Negative for trouble swallowing.   Eyes:  Negative for visual disturbance.  Respiratory:  Positive for cough and shortness of breath. Negative for wheezing.   Cardiovascular:  Negative for chest pain, palpitations and leg swelling.  Gastrointestinal:  Negative for abdominal pain, blood in stool, constipation and diarrhea.  Endocrine: Negative for polydipsia and polyuria.  Genitourinary:  Negative for dysuria and hematuria.  Skin:  Negative for color change and rash.       Some pruritus   Neurological:  Negative for dizziness, tremors, numbness and headaches.  Psychiatric/Behavioral:  Negative for dysphoric mood and sleep disturbance.     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   Mixed hyperlipidemia 03/26/2021   Acquired hypothyroidism 03/26/2021   Essential hypertension 03/26/2021   Tobacco use disorder 03/26/2021   Chronic bilateral low back pain without sciatica 03/26/2021  No Known Allergies  Past Surgical History:  Procedure Laterality Date   CYSTOSCOPY W/ RETROGRADES Bilateral 07/03/2021   Procedure: CYSTOSCOPY WITH RETROGRADE PYELOGRAM;  Surgeon: Hollice Espy, MD;  Location: [facility];  Service: Urology;  Laterality: Bilateral;   CYSTOSCOPY WITH  LITHOLAPAXY N/A 07/03/2021   Procedure: CYSTOSCOPY WITH LITHOLAPAXY;  Surgeon: Hollice Espy, MD;  Location: [facility];  Service: Urology;  Laterality: N/A;   HOLEP-LASER ENUCLEATION OF THE PROSTATE WITH MORCELLATION N/A 07/03/2021   Procedure: HOLEP-LASER ENUCLEATION OF THE PROSTATE WITH MORCELLATION;  Surgeon: Hollice Espy, MD;  Location: [facility];  Service: Urology;  Laterality: N/A;   SPINE SURGERY  2015   X 4 surgery - lumbar disc    Social History   Tobacco Use   Smoking status: Heavy Smoker    Packs/day: 1.00    Years: 54.00    Total pack years: 54.00    Types: Cigarettes    Passive exposure: Current   Smokeless tobacco: Never  Vaping Use   Vaping Use: Never used  Substance Use Topics   Alcohol use: Not Currently    Comment: quit drinking at 19   Drug use: Never     Medication list has been reviewed and updated.  Current Meds  Medication Sig   acetaminophen (TYLENOL) 500 MG tablet Take 1,000 mg by mouth every 6 (six) hours as needed for moderate pain.   aspirin EC 81 MG EC tablet Take 1 tablet (81 mg total) by mouth daily. Swallow whole.   calcitRIOL (ROCALTROL) 0.25 MCG capsule Take 0.25 mcg by mouth 2 (two) times a week.   carvedilol (COREG) 25 MG tablet Take 1 tablet (25 mg total) by mouth 2 (two) times daily with a meal.   Cholecalciferol (D3-1000 PO) Take 2,000 Units by mouth daily.   levothyroxine (EUTHYROX) 112 MCG tablet Take 1 tablet (112 mcg total) by mouth daily before breakfast.   MAGNESIUM PO Take 1,000 mg by mouth daily.   Melatonin 10 MG CAPS Take by mouth.   Multiple Vitamins-Minerals (MULTIVITAL-M PO) Take 1 tablet by mouth daily.   omeprazole (PRILOSEC) 20 MG capsule Take 1 capsule (20 mg total) by mouth daily.   rosuvastatin (CRESTOR) 5 MG tablet Take 1 tablet (5 mg total) by mouth daily.   sodium bicarbonate 650 MG tablet Take by mouth.       09/17/2022    1:19 PM 06/27/2022   10:04 AM 03/11/2022    1:19 PM 12/27/2021   10:13 AM  GAD 7 :  Generalized Anxiety Score  Nervous, Anxious, on Edge 0 0 0 0  Control/stop worrying 0 0 0 0  Worry too much - different things 0 0 0 0  Trouble relaxing 0 0 0 0  Restless 0 0 0 0  Easily annoyed or irritable 0 0 1 0  Afraid - awful might happen 0 0 0 0  Total GAD 7 Score 0 0 1 0  Anxiety Difficulty Not difficult at all Not difficult at all Not difficult at all        09/17/2022    1:19 PM 08/21/2022   10:15 AM 06/27/2022   10:04 AM  Depression screen PHQ 2/9  Decreased Interest 0 1 0  Down, Depressed, Hopeless 0 1 0  PHQ - 2 Score 0 2 0  Altered sleeping 0 0 0  Tired, decreased energy 0 0 0  Change in appetite 0 0 0  Feeling bad or failure about yourself  0 0 0  Trouble concentrating 0  0 0  Moving slowly or fidgety/restless 0 0 0  Suicidal thoughts 0 0 0  PHQ-9 Score 0 2 0  Difficult doing work/chores Not difficult at all Not difficult at all Not difficult at all    BP Readings from Last 3 Encounters:  09/17/22 128/78  06/27/22 132/66  03/22/22 (!) 186/79    Physical Exam Vitals and nursing note reviewed.  Constitutional:      General: He is not in acute distress.    Appearance: Normal appearance. He is well-developed.  HENT:     Head: Normocephalic and atraumatic.  Cardiovascular:     Rate and Rhythm: Normal rate and regular rhythm.     Heart sounds: No murmur heard. Pulmonary:     Effort: Pulmonary effort is normal. No respiratory distress.     Breath sounds: Transmitted upper airway sounds present. Examination of the right-upper field reveals wheezing. Examination of the left-upper field reveals wheezing. Wheezing present.  Musculoskeletal:     Cervical back: Normal range of motion.     Right lower leg: No edema.     Left lower leg: No edema.  Lymphadenopathy:     Cervical: No cervical adenopathy.  Skin:    General: Skin is warm and dry.     Findings: No rash.  Neurological:     General: No focal deficit present.     Mental Status: He is alert and  oriented to person, place, and time.  Psychiatric:        Mood and Affect: Mood normal.        Behavior: Behavior normal.     Wt Readings from Last 3 Encounters:  09/17/22 191 lb 3.2 oz (86.7 kg)  06/27/22 199 lb (90.3 kg)  03/22/22 196 lb (88.9 kg)    BP 128/78 (BP Location: Left Arm, Patient Position: Sitting, Cuff Size: Normal)   Pulse 75   Ht _0  (1.803 m)   Wt 191 lb 3.2 oz (86.7 kg)   SpO2 97%   BMI 26.67 kg/m   Assessment and Plan: 1. Essential hypertension Clinically stable exam with well controlled BP. Tolerating medications without side effects at this time. Pt to continue current regimen and low sodium diet; benefits of regular exercise as able discussed.  2. Prediabetes Improved with diet changes Weight loss mild - likely due to stage IV CKD and appetite change - POCT glycosylated hemoglobin (Hb A1C) = 5.7 down from 6  3. GERD without esophagitis Symptoms well controlled on daily PPI No red flag signs such as n/v, melena Will continue omeprazole. - omeprazole (PRILOSEC) 20 MG capsule; Take 1 capsule (20 mg total) by mouth daily.  Dispense: 100 capsule; Refill: 1  4. Acquired hypothyroidism supplemented - levothyroxine (EUTHYROX) 112 MCG tablet; Take 1 tablet (112 mcg total) by mouth daily before breakfast.  Dispense: 100 tablet; Refill: 1   Partially dictated using Editor, commissioning. Any errors are unintentional.  Halina Maidens, MD [facility]  09/17/2022

## 2022-09-21 ENCOUNTER — Other Ambulatory Visit: Payer: Self-pay | Admitting: Internal Medicine

## 2022-09-21 DIAGNOSIS — E782 Mixed hyperlipidemia: Secondary | ICD-10-CM

## 2022-09-23 DIAGNOSIS — H35363 Drusen (degenerative) of macula, bilateral: Secondary | ICD-10-CM | POA: Diagnosis not present

## 2022-09-23 NOTE — Telephone Encounter (Signed)
Requested Prescriptions  Pending Prescriptions Disp Refills   rosuvastatin (CRESTOR) 5 MG tablet [Pharmacy Med Name: Rosuvastatin Calcium 5 MG Oral Tablet] 100 tablet 2    Sig: Take 1 tablet by mouth once daily     Cardiovascular:  Antilipid - Statins 2 Failed - 09/21/2022  9:34 AM      Failed - Cr in normal range and within 360 days    Creatinine  Date Value Ref Range Status  09/11/2022 4.1 (A) 0.6 - 1.3 Final   Creatinine, Ser  Date Value Ref Range Status  09/01/2021 3.53 (H) 0.61 - 1.24 mg/dL Final         Failed - Lipid Panel in normal range within the last 12 months    Cholesterol, Total  Date Value Ref Range Status  12/27/2021 145 100 - 199 mg/dL Final   LDL Chol Calc (NIH)  Date Value Ref Range Status  12/27/2021 87 0 - 99 mg/dL Final   HDL  Date Value Ref Range Status  12/27/2021 36 (L) >39 mg/dL Final   Triglycerides  Date Value Ref Range Status  12/27/2021 122 0 - 149 mg/dL Final         Passed - Patient is not pregnant      Passed - Valid encounter within last 12 months    Recent Outpatient Visits           6 days ago Essential hypertension   [place] [facility] at [facility], Jesse Sans, MD   2 months ago Essential hypertension   [place] [facility] at [facility], Jesse Sans, MD   6 months ago Prediabetes   [place] [facility] at [facility], Jesse Sans, MD   9 months ago Essential hypertension   [place] [facility] at [facility], Jesse Sans, MD   12 months ago Essential hypertension    [facility] and [facility] Medicine at [facility], Jesse Sans, MD       Future Appointments             In 5 months Army Melia, Jesse Sans, MD [place] [facility] at [facility], [facility]   In 6 months Hollice Espy, [facility] [place]

## 2022-10-23 DIAGNOSIS — D631 Anemia in chronic kidney disease: Secondary | ICD-10-CM | POA: Diagnosis not present

## 2022-10-23 DIAGNOSIS — R809 Proteinuria, unspecified: Secondary | ICD-10-CM | POA: Diagnosis not present

## 2022-10-23 DIAGNOSIS — N2581 Secondary hyperparathyroidism of renal origin: Secondary | ICD-10-CM | POA: Diagnosis not present

## 2022-10-23 DIAGNOSIS — E875 Hyperkalemia: Secondary | ICD-10-CM | POA: Diagnosis not present

## 2022-10-23 DIAGNOSIS — I10 Essential (primary) hypertension: Secondary | ICD-10-CM | POA: Diagnosis not present

## 2022-10-23 DIAGNOSIS — J449 Chronic obstructive pulmonary disease, unspecified: Secondary | ICD-10-CM | POA: Diagnosis not present

## 2022-10-23 DIAGNOSIS — N184 Chronic kidney disease, stage 4 (severe): Secondary | ICD-10-CM | POA: Diagnosis not present

## 2022-10-23 DIAGNOSIS — N281 Cyst of kidney, acquired: Secondary | ICD-10-CM | POA: Diagnosis not present

## 2022-12-18 DIAGNOSIS — N184 Chronic kidney disease, stage 4 (severe): Secondary | ICD-10-CM | POA: Diagnosis not present

## 2022-12-18 DIAGNOSIS — N2581 Secondary hyperparathyroidism of renal origin: Secondary | ICD-10-CM | POA: Diagnosis not present

## 2022-12-18 DIAGNOSIS — D631 Anemia in chronic kidney disease: Secondary | ICD-10-CM | POA: Diagnosis not present

## 2022-12-18 DIAGNOSIS — J449 Chronic obstructive pulmonary disease, unspecified: Secondary | ICD-10-CM | POA: Diagnosis not present

## 2022-12-18 DIAGNOSIS — N400 Enlarged prostate without lower urinary tract symptoms: Secondary | ICD-10-CM | POA: Diagnosis not present

## 2022-12-18 DIAGNOSIS — N281 Cyst of kidney, acquired: Secondary | ICD-10-CM | POA: Diagnosis not present

## 2022-12-18 DIAGNOSIS — R809 Proteinuria, unspecified: Secondary | ICD-10-CM | POA: Diagnosis not present

## 2022-12-18 DIAGNOSIS — I10 Essential (primary) hypertension: Secondary | ICD-10-CM | POA: Diagnosis not present

## 2022-12-18 DIAGNOSIS — E875 Hyperkalemia: Secondary | ICD-10-CM | POA: Diagnosis not present

## 2022-12-19 DIAGNOSIS — E875 Hyperkalemia: Secondary | ICD-10-CM | POA: Diagnosis not present

## 2022-12-19 DIAGNOSIS — D631 Anemia in chronic kidney disease: Secondary | ICD-10-CM | POA: Diagnosis not present

## 2022-12-19 DIAGNOSIS — J449 Chronic obstructive pulmonary disease, unspecified: Secondary | ICD-10-CM | POA: Diagnosis not present

## 2022-12-19 DIAGNOSIS — I10 Essential (primary) hypertension: Secondary | ICD-10-CM | POA: Diagnosis not present

## 2022-12-19 DIAGNOSIS — N2581 Secondary hyperparathyroidism of renal origin: Secondary | ICD-10-CM | POA: Diagnosis not present

## 2022-12-19 DIAGNOSIS — R809 Proteinuria, unspecified: Secondary | ICD-10-CM | POA: Diagnosis not present

## 2022-12-19 DIAGNOSIS — N281 Cyst of kidney, acquired: Secondary | ICD-10-CM | POA: Diagnosis not present

## 2022-12-19 DIAGNOSIS — N184 Chronic kidney disease, stage 4 (severe): Secondary | ICD-10-CM | POA: Diagnosis not present

## 2022-12-19 DIAGNOSIS — N400 Enlarged prostate without lower urinary tract symptoms: Secondary | ICD-10-CM | POA: Diagnosis not present

## 2022-12-26 DIAGNOSIS — B351 Tinea unguium: Secondary | ICD-10-CM | POA: Diagnosis not present

## 2022-12-26 DIAGNOSIS — M79675 Pain in left toe(s): Secondary | ICD-10-CM | POA: Diagnosis not present

## 2022-12-26 DIAGNOSIS — M79674 Pain in right toe(s): Secondary | ICD-10-CM | POA: Diagnosis not present

## 2022-12-26 DIAGNOSIS — L600 Ingrowing nail: Secondary | ICD-10-CM | POA: Diagnosis not present

## 2023-01-15 DIAGNOSIS — N2581 Secondary hyperparathyroidism of renal origin: Secondary | ICD-10-CM | POA: Diagnosis not present

## 2023-01-15 DIAGNOSIS — N400 Enlarged prostate without lower urinary tract symptoms: Secondary | ICD-10-CM | POA: Diagnosis not present

## 2023-01-15 DIAGNOSIS — J449 Chronic obstructive pulmonary disease, unspecified: Secondary | ICD-10-CM | POA: Diagnosis not present

## 2023-01-15 DIAGNOSIS — N184 Chronic kidney disease, stage 4 (severe): Secondary | ICD-10-CM | POA: Diagnosis not present

## 2023-01-15 DIAGNOSIS — I10 Essential (primary) hypertension: Secondary | ICD-10-CM | POA: Diagnosis not present

## 2023-01-15 DIAGNOSIS — R809 Proteinuria, unspecified: Secondary | ICD-10-CM | POA: Diagnosis not present

## 2023-01-15 DIAGNOSIS — D631 Anemia in chronic kidney disease: Secondary | ICD-10-CM | POA: Diagnosis not present

## 2023-01-15 DIAGNOSIS — N281 Cyst of kidney, acquired: Secondary | ICD-10-CM | POA: Diagnosis not present

## 2023-01-15 DIAGNOSIS — E875 Hyperkalemia: Secondary | ICD-10-CM | POA: Diagnosis not present

## 2023-02-26 DIAGNOSIS — N400 Enlarged prostate without lower urinary tract symptoms: Secondary | ICD-10-CM | POA: Diagnosis not present

## 2023-02-26 DIAGNOSIS — E875 Hyperkalemia: Secondary | ICD-10-CM | POA: Diagnosis not present

## 2023-02-26 DIAGNOSIS — R809 Proteinuria, unspecified: Secondary | ICD-10-CM | POA: Diagnosis not present

## 2023-02-26 DIAGNOSIS — N281 Cyst of kidney, acquired: Secondary | ICD-10-CM | POA: Diagnosis not present

## 2023-02-26 DIAGNOSIS — N184 Chronic kidney disease, stage 4 (severe): Secondary | ICD-10-CM | POA: Diagnosis not present

## 2023-02-26 DIAGNOSIS — I10 Essential (primary) hypertension: Secondary | ICD-10-CM | POA: Diagnosis not present

## 2023-02-26 DIAGNOSIS — N2581 Secondary hyperparathyroidism of renal origin: Secondary | ICD-10-CM | POA: Diagnosis not present

## 2023-02-26 DIAGNOSIS — J449 Chronic obstructive pulmonary disease, unspecified: Secondary | ICD-10-CM | POA: Diagnosis not present

## 2023-02-26 DIAGNOSIS — D631 Anemia in chronic kidney disease: Secondary | ICD-10-CM | POA: Diagnosis not present

## 2023-02-26 LAB — CBC AND DIFFERENTIAL
HCT: 33 — AB (ref 41–53)
Hemoglobin: 10.8 — AB (ref 13.5–17.5)
Platelets: 153 10*3/uL (ref 150–400)
WBC: 7.4

## 2023-02-26 LAB — BASIC METABOLIC PANEL
BUN: 46 — AB (ref 4–21)
CO2: 21 (ref 13–22)
Chloride: 111 — AB (ref 99–108)
Creatinine: 4.8 — AB (ref 0.6–1.3)
Potassium: 4.9 mEq/L (ref 3.5–5.1)
Sodium: 141 (ref 137–147)

## 2023-02-26 LAB — COMPREHENSIVE METABOLIC PANEL
Calcium: 9.7 (ref 8.7–10.7)
eGFR: 12

## 2023-03-04 DIAGNOSIS — N185 Chronic kidney disease, stage 5: Secondary | ICD-10-CM | POA: Diagnosis not present

## 2023-03-04 DIAGNOSIS — I129 Hypertensive chronic kidney disease with stage 1 through stage 4 chronic kidney disease, or unspecified chronic kidney disease: Secondary | ICD-10-CM | POA: Diagnosis not present

## 2023-03-04 DIAGNOSIS — N2581 Secondary hyperparathyroidism of renal origin: Secondary | ICD-10-CM | POA: Diagnosis not present

## 2023-03-04 DIAGNOSIS — F17200 Nicotine dependence, unspecified, uncomplicated: Secondary | ICD-10-CM | POA: Diagnosis not present

## 2023-03-04 DIAGNOSIS — R809 Proteinuria, unspecified: Secondary | ICD-10-CM | POA: Diagnosis not present

## 2023-03-18 NOTE — Assessment & Plan Note (Signed)
Tolerating statin medications.  No side effects noted. LDL is  Lab Results  Component Value Date   LDLCALC 87 12/27/2021  On Crestor 5 mg Will advise if dose change is indicated.

## 2023-03-18 NOTE — Assessment & Plan Note (Addendum)
Followed regularly by Nephrology  Recent labs stable Planning to see VS for HD access

## 2023-03-18 NOTE — Assessment & Plan Note (Signed)
Stable exam with well controlled BP.  Currently taking Coreg. Tolerating medications without concerns or side effects. Will continue to recommend low sodium diet and current regimen.

## 2023-03-18 NOTE — Progress Notes (Unsigned)
Date:  03/19/2023   Name:  Tyrone Moore   DOB:  [birth year]   MRN:  [id number]   Chief Complaint: No chief complaint on file. Tyrone Moore is a 72 y.o. male who presents today for his Complete Annual Exam. He feels {DESC; WELL/FAIRLY WELL/POORLY:18703}. He reports exercising ***. He reports he is sleeping {DESC; WELL/FAIRLY WELL/POORLY:18703}.   Colonoscopy: none - declined   There is no immunization history on file for this patient. Health Maintenance Due  Topic Date Due   COVID-19 Vaccine (1) Never done   Pneumonia Vaccine 50+ Years old (1 of 2 - PCV) Never done   Hepatitis C Screening  Never done   DTaP/Tdap/Td (1 - Tdap) Never done   Zoster Vaccines- Shingrix (1 of 2) Never done    Lab Results  Component Value Date   PSA1 4.0 03/26/2021   Hypertension This is a chronic problem. The problem is controlled. Pertinent negatives include no chest pain, headaches, palpitations or shortness of breath. Past treatments include beta blockers. Hypertensive end-organ damage includes kidney disease and CVA.  Hyperlipidemia This is a chronic problem. The problem is controlled. Pertinent negatives include no chest pain, myalgias or shortness of breath. Current antihyperlipidemic treatment includes statins.  Diabetes He presents for his follow-up diabetic visit. Diabetes type: prediabetes. Pertinent negatives for hypoglycemia include no dizziness, headaches or nervousness/anxiousness. Pertinent negatives for diabetes include no chest pain and no fatigue. Symptoms are stable. Diabetic complications include a CVA.    Lab Results  Component Value Date   NA 141 02/26/2023   K 4.9 02/26/2023   CO2 21 02/26/2023   GLUCOSE 98 09/01/2021   BUN 46 (A) 02/26/2023   CREATININE 4.8 (A) 02/26/2023   CALCIUM 9.7 02/26/2023   EGFR 12 02/26/2023   GFRNONAA 18 (L) 09/01/2021   Lab Results  Component Value Date   CHOL 145 12/27/2021   HDL 36 (L) 12/27/2021   LDLCALC 87 12/27/2021   TRIG 122  12/27/2021   CHOLHDL 4.0 12/27/2021   Lab Results  Component Value Date   TSH 2.870 12/27/2021   Lab Results  Component Value Date   HGBA1C 5.7 (A) 09/17/2022   Lab Results  Component Value Date   WBC 7.4 02/26/2023   HGB 10.8 (A) 02/26/2023   HCT 33 (A) 02/26/2023   MCV 100.0 08/31/2021   PLT 153 02/26/2023   Lab Results  Component Value Date   ALT 13 12/27/2021   AST 18 12/27/2021   ALKPHOS 105 12/27/2021   BILITOT 0.2 12/27/2021   No results found for: "25OHVITD2", "25OHVITD3", "VD25OH"   Review of Systems  Constitutional:  Negative for appetite change, chills, diaphoresis, fatigue and unexpected weight change.  HENT:  Negative for hearing loss, tinnitus, trouble swallowing and voice change.   Eyes:  Negative for visual disturbance.  Respiratory:  Negative for choking, shortness of breath and wheezing.   Cardiovascular:  Negative for chest pain, palpitations and leg swelling.  Gastrointestinal:  Negative for abdominal pain, blood in stool, constipation and diarrhea.  Genitourinary:  Negative for difficulty urinating, dysuria and frequency.  Musculoskeletal:  Negative for arthralgias, back pain and myalgias.  Skin:  Negative for color change and rash.  Neurological:  Negative for dizziness, syncope and headaches.  Hematological:  Negative for adenopathy.  Psychiatric/Behavioral:  Negative for dysphoric mood and sleep disturbance. The patient is not nervous/anxious.     Patient Active Problem List   Diagnosis Date Noted   Prediabetes 09/17/2022  GERD without esophagitis 09/17/2022   History of stroke 09/01/2021   Anemia in chronic kidney disease 08/15/2021   Cyst of kidney, acquired 08/15/2021   Hyperparathyroidism due to renal insufficiency (HCC) 08/15/2021   Chronic obstructive pulmonary disease, unspecified (HCC) 07/11/2021   Proteinuria, unspecified 05/16/2021   CKD (chronic kidney disease) stage 4, GFR 15-29 ml/min (HCC) 03/27/2021   BPH with  obstruction/lower urinary tract symptoms 03/26/2021   Mixed hyperlipidemia 03/26/2021   Acquired hypothyroidism 03/26/2021   Essential hypertension 03/26/2021   Tobacco use disorder 03/26/2021   Chronic bilateral low back pain without sciatica 03/26/2021    No Known Allergies  Past Surgical History:  Procedure Laterality Date   CYSTOSCOPY W/ RETROGRADES Bilateral 07/03/2021   Procedure: CYSTOSCOPY WITH RETROGRADE PYELOGRAM;  Surgeon: Vanna Scotland, MD;  Location: [facility];  Service: Urology;  Laterality: Bilateral;   CYSTOSCOPY WITH LITHOLAPAXY N/A 07/03/2021   Procedure: CYSTOSCOPY WITH LITHOLAPAXY;  Surgeon: Vanna Scotland, MD;  Location: [facility];  Service: Urology;  Laterality: N/A;   HOLEP-LASER ENUCLEATION OF THE PROSTATE WITH MORCELLATION N/A 07/03/2021   Procedure: HOLEP-LASER ENUCLEATION OF THE PROSTATE WITH MORCELLATION;  Surgeon: Vanna Scotland, MD;  Location: [facility];  Service: Urology;  Laterality: N/A;   SPINE SURGERY  2015   X 4 surgery - lumbar disc    Social History   Tobacco Use   Smoking status: Heavy Smoker    Packs/day: 1.00    Years: 54.00    Additional pack years: 0.00    Total pack years: 54.00    Types: Cigarettes    Passive exposure: Current   Smokeless tobacco: Never  Vaping Use   Vaping Use: Never used  Substance Use Topics   Alcohol use: Not Currently    Comment: quit drinking at 19   Drug use: Never     Medication list has been reviewed and updated.  No outpatient medications have been marked as taking for the 03/19/23 encounter (Office Visit) with [name], MD.       09/17/2022    1:19 PM 06/27/2022   10:04 AM 03/11/2022    1:19 PM 12/27/2021   10:13 AM  GAD 7 : Generalized Anxiety Score  Nervous, Anxious, on Edge 0 0 0 0  Control/stop worrying 0 0 0 0  Worry too much - different things 0 0 0 0  Trouble relaxing 0 0 0 0  Restless 0 0 0 0  Easily annoyed or irritable 0 0 1 0  Afraid - awful might happen 0 0 0 0  Total  GAD 7 Score 0 0 1 0  Anxiety Difficulty Not difficult at all Not difficult at all Not difficult at all        09/17/2022    1:19 PM 08/21/2022   10:15 AM 06/27/2022   10:04 AM  Depression screen PHQ 2/9  Decreased Interest 0 1 0  Down, Depressed, Hopeless 0 1 0  PHQ - 2 Score 0 2 0  Altered sleeping 0 0 0  Tired, decreased energy 0 0 0  Change in appetite 0 0 0  Feeling bad or failure about yourself  0 0 0  Trouble concentrating 0 0 0  Moving slowly or fidgety/restless 0 0 0  Suicidal thoughts 0 0 0  PHQ-9 Score 0 2 0  Difficult doing work/chores Not difficult at all Not difficult at all Not difficult at all    BP Readings from Last 3 Encounters:  09/17/22 128/78  06/27/22 132/66  03/22/22 Marland Kitchen)  186/79    Physical Exam Vitals and nursing note reviewed.  Constitutional:      Appearance: Normal appearance. He is well-developed.  HENT:     Head: Normocephalic.     Right Ear: Tympanic membrane, ear canal and external ear normal.     Left Ear: Tympanic membrane, ear canal and external ear normal.     Nose: Nose normal.  Eyes:     Conjunctiva/sclera: Conjunctivae normal.     Pupils: Pupils are equal, round, and reactive to light.  Neck:     Thyroid: No thyromegaly.     Vascular: No carotid bruit.  Cardiovascular:     Rate and Rhythm: Normal rate and regular rhythm.     Heart sounds: Normal heart sounds.  Pulmonary:     Effort: Pulmonary effort is normal.     Breath sounds: Normal breath sounds. No wheezing.  Chest:  Breasts:    Right: No mass.     Left: No mass.  Abdominal:     General: Bowel sounds are normal.     Palpations: Abdomen is soft.     Tenderness: There is no abdominal tenderness.  Musculoskeletal:        General: Normal range of motion.     Cervical back: Normal range of motion and neck supple.  Lymphadenopathy:     Cervical: No cervical adenopathy.  Skin:    General: Skin is warm and dry.  Neurological:     Mental Status: He is alert and oriented  to person, place, and time.     Deep Tendon Reflexes: Reflexes are normal and symmetric.  Psychiatric:        Attention and Perception: Attention normal.        Mood and Affect: Mood normal.        Thought Content: Thought content normal.     Wt Readings from Last 3 Encounters:  09/17/22 191 lb 3.2 oz (86.7 kg)  06/27/22 199 lb (90.3 kg)  03/22/22 196 lb (88.9 kg)    There were no vitals taken for this visit.  Assessment and Plan:  Problem List Items Addressed This Visit       Cardiovascular and Mediastinum   Essential hypertension - Primary (Chronic)    Stable exam with well controlled BP.  Currently taking Coreg. Tolerating medications without concerns or side effects. Will continue to recommend low sodium diet and current regimen.         Genitourinary   CKD (chronic kidney disease) stage 4, GFR 15-29 ml/min (HCC) (Chronic)    Followed regularly by Nephrology  Recent labs stable       No follow-ups on file.   Partially dictated using Dragon software, any errors are not intentional.  [name], MD [facility] [place], [place]

## 2023-03-18 NOTE — Assessment & Plan Note (Signed)
Diet controlled. Lab Results  Component Value Date   HGBA1C 5.7 (A) 09/17/2022

## 2023-03-19 ENCOUNTER — Encounter: Payer: Self-pay | Admitting: Internal Medicine

## 2023-03-19 ENCOUNTER — Ambulatory Visit (INDEPENDENT_AMBULATORY_CARE_PROVIDER_SITE_OTHER): Payer: Medicare HMO | Admitting: Internal Medicine

## 2023-03-19 VITALS — BP 128/70 | HR 78 | Ht 71.0 in | Wt 196.0 lb

## 2023-03-19 DIAGNOSIS — Z0000 Encounter for general adult medical examination without abnormal findings: Secondary | ICD-10-CM | POA: Diagnosis not present

## 2023-03-19 DIAGNOSIS — Z1159 Encounter for screening for other viral diseases: Secondary | ICD-10-CM

## 2023-03-19 DIAGNOSIS — I10 Essential (primary) hypertension: Secondary | ICD-10-CM

## 2023-03-19 DIAGNOSIS — K219 Gastro-esophageal reflux disease without esophagitis: Secondary | ICD-10-CM

## 2023-03-19 DIAGNOSIS — F17200 Nicotine dependence, unspecified, uncomplicated: Secondary | ICD-10-CM

## 2023-03-19 DIAGNOSIS — J449 Chronic obstructive pulmonary disease, unspecified: Secondary | ICD-10-CM

## 2023-03-19 DIAGNOSIS — E782 Mixed hyperlipidemia: Secondary | ICD-10-CM

## 2023-03-19 DIAGNOSIS — R7303 Prediabetes: Secondary | ICD-10-CM

## 2023-03-19 DIAGNOSIS — G8929 Other chronic pain: Secondary | ICD-10-CM

## 2023-03-19 DIAGNOSIS — M545 Low back pain, unspecified: Secondary | ICD-10-CM

## 2023-03-19 DIAGNOSIS — N184 Chronic kidney disease, stage 4 (severe): Secondary | ICD-10-CM

## 2023-03-19 DIAGNOSIS — N2581 Secondary hyperparathyroidism of renal origin: Secondary | ICD-10-CM

## 2023-03-19 DIAGNOSIS — E039 Hypothyroidism, unspecified: Secondary | ICD-10-CM

## 2023-03-19 MED ORDER — OMEPRAZOLE 20 MG PO CPDR: 20.0000 mg | DELAYED_RELEASE_CAPSULE | Freq: Every day | ORAL | 1 refills | Status: DC

## 2023-03-19 MED ORDER — LEVOTHYROXINE SODIUM 112 MCG PO TABS: 112.0000 ug | ORAL_TABLET | Freq: Every day | ORAL | 1 refills | Status: DC

## 2023-03-19 NOTE — Assessment & Plan Note (Signed)
He continues to smoke with no interest in quitting. He declines LDCT

## 2023-03-19 NOTE — Assessment & Plan Note (Signed)
Chronic low back pain and right hip pain Hard to get out in the community. He would like to see if he can get a scooter -

## 2023-03-19 NOTE — Assessment & Plan Note (Signed)
He continues to smoke - does not appear to be limited by shortness of breath although he has a chronic cough. He is not using any MDIs at this time.

## 2023-03-19 NOTE — Assessment & Plan Note (Signed)
Reflux symptoms are minimal on current therapy - omeprazole. No red flag signs such as weight loss, n/v, melena  

## 2023-03-19 NOTE — Assessment & Plan Note (Signed)
supplemented

## 2023-03-20 LAB — HEMOGLOBIN A1C
Est. average glucose Bld gHb Est-mCnc: 120 mg/dL
Hgb A1c MFr Bld: 5.8 % — ABNORMAL HIGH (ref 4.8–5.6)

## 2023-03-20 LAB — LIPID PANEL
Chol/HDL Ratio: 4.6 ratio (ref 0.0–5.0)
Cholesterol, Total: 146 mg/dL (ref 100–199)
HDL: 32 mg/dL — ABNORMAL LOW (ref >39)
LDL Chol Calc (NIH): 83 mg/dL (ref 0–99)
Triglycerides: 180 mg/dL — ABNORMAL HIGH (ref 0–149)
VLDL Cholesterol Cal: 31 mg/dL (ref 5–40)

## 2023-03-20 LAB — HEPATIC FUNCTION PANEL
ALT: 21 IU/L (ref 0–44)
AST: 21 IU/L (ref 0–40)
Albumin: 4.2 g/dL (ref 3.8–4.8)
Alkaline Phosphatase: 119 IU/L (ref 44–121)
Bilirubin Total: 0.3 mg/dL (ref 0.0–1.2)
Bilirubin, Direct: 0.1 mg/dL (ref 0.00–0.40)
Total Protein: 7 g/dL (ref 6.0–8.5)

## 2023-03-20 LAB — HEPATITIS C ANTIBODY: Hep C Virus Ab: NONREACTIVE

## 2023-03-20 LAB — TSH+FREE T4
Free T4: 1.29 ng/dL (ref 0.82–1.77)
TSH: 0.712 u[IU]/mL (ref 0.450–4.500)

## 2023-03-21 ENCOUNTER — Ambulatory Visit: Payer: Medicare HMO | Admitting: Urology

## 2023-03-21 VITALS — BP 110/58 | HR 59 | Ht 71.0 in | Wt 197.1 lb

## 2023-03-21 DIAGNOSIS — N401 Enlarged prostate with lower urinary tract symptoms: Secondary | ICD-10-CM | POA: Diagnosis not present

## 2023-03-21 DIAGNOSIS — N184 Chronic kidney disease, stage 4 (severe): Secondary | ICD-10-CM | POA: Diagnosis not present

## 2023-03-21 DIAGNOSIS — N138 Other obstructive and reflux uropathy: Secondary | ICD-10-CM | POA: Diagnosis not present

## 2023-03-21 DIAGNOSIS — Z720 Tobacco use: Secondary | ICD-10-CM | POA: Diagnosis not present

## 2023-03-21 DIAGNOSIS — N393 Stress incontinence (female) (male): Secondary | ICD-10-CM

## 2023-03-21 LAB — BLADDER SCAN AMB NON-IMAGING: Scan Result: 7

## 2023-03-21 NOTE — Progress Notes (Signed)
I, Tyrone Moore, acting as a scribe for Tyrone Scotland, MD.,have documented all relevant documentation on the behalf of Tyrone Scotland, MD,as directed by  Tyrone Scotland, MD while in the presence of Tyrone Scotland, MD.  03/21/2023 6:41 PM   Tyrone Moore 08-Jun-1951 [id number]  Referring provider: [name], MD 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  He underwent a HoLep in August 2022. Last year he was still experiencing post operative stress incontinence. He was referred to physical therapy but the chart indicates he elected not to proceed. His most recent PSA was 0.27 on 03/21/2022. He is no longer on any BPH medications.   He reports still having leakage and wears more than one diaper a day. He never heard from the physical therapy office to get scheduled. He is able to fully empty his bladder. He is still smoking. He is seeing Dr. Suezanne Jacquet in a couple weeks to start the process regarding dialysis.   Results for orders placed or performed in visit on 03/21/23  Bladder Scan (Post Void Residual) in office  Result Value Ref Range   Scan Result 7 ml      IPSS     Row Name 03/21/23 1100         International Prostate Symptom Score   How often have you had the sensation of not emptying your bladder? Not at All     How often have you had to urinate less than every two hours? Not at All     How often have you found you stopped and started again several times when you urinated? Not at All     How often have you found it difficult to postpone urination? Not at All     How often have you had a weak urinary stream? Not at All     How often have you had to strain to start urination? Not at All     How many times did you typically  get up at night to urinate? 2 Times     Total IPSS Score 2       Quality of Life due to urinary symptoms   If you were to spend the rest of your life with your urinary condition just the way it is now how would you feel about that? 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   Chronic bilateral low back pain without sciatica 03/26/2021   S/p 4 lumbar spine surgeries   Chronic obstructive pulmonary disease, unspecified (HCC) 07/11/2021   CKD (chronic kidney disease) stage 4, GFR 15-29 ml/min (HCC) 03/27/2021   COVID-19 virus infection 08/31/2021   Essential hypertension 03/26/2021   GERD (gastroesophageal reflux disease)    Hyperlipidemia    Hypertension    Mixed hyperlipidemia 03/26/2021   Thyroid disease     Surgical History: Past Surgical History:  Procedure Laterality Date   CYSTOSCOPY W/ RETROGRADES Bilateral 07/03/2021   Procedure: CYSTOSCOPY WITH  RETROGRADE PYELOGRAM;  Surgeon: Tyrone Scotland, MD;  Location: [facility];  Service: Urology;  Laterality: Bilateral;   CYSTOSCOPY WITH LITHOLAPAXY N/A 07/03/2021   Procedure: CYSTOSCOPY WITH LITHOLAPAXY;  Surgeon: Tyrone Scotland, MD;  Location: [facility];  Service: Urology;  Laterality: N/A;   HOLEP-LASER ENUCLEATION OF THE PROSTATE WITH MORCELLATION N/A 07/03/2021   Procedure: HOLEP-LASER ENUCLEATION OF THE PROSTATE WITH MORCELLATION;  Surgeon: Tyrone Scotland, MD;  Location: [facility];  Service: Urology;  Laterality: N/A;   SPINE SURGERY  2015   X 4 surgery - lumbar disc    Home Medications:  Allergies as of 03/21/2023   No Known Allergies      Medication List        Accurate as of Mar 21, 2023  6:41 PM. If you have any questions, ask your nurse or doctor.           acetaminophen 500 MG tablet Commonly known as: TYLENOL Take 1,000 mg by mouth every 6 (six) hours as needed for moderate pain.   amLODipine 5 MG tablet Commonly known as: NORVASC Take 5 mg by mouth daily.   aspirin EC 81 MG tablet Take 1 tablet (81 mg total) by mouth daily. Swallow whole.   carvedilol 25 MG tablet Commonly known as: COREG Take 1 tablet (25 mg total) by mouth 2 (two) times daily with a meal.   D3-1000 PO Take 2,000 Units by mouth daily.   levothyroxine 112 MCG tablet Commonly known as: Euthyrox Take 1 tablet (112 mcg total) by mouth daily before breakfast.   MAGNESIUM PO Take 1,000 mg by mouth daily.   Melatonin 10 MG Caps Take by mouth.   MULTIVITAL-M PO Take 1 tablet by mouth daily.   omeprazole 20 MG capsule Commonly known as: PRILOSEC Take 1 capsule (20 mg total) by mouth daily.   rosuvastatin 5 MG tablet Commonly known as: CRESTOR Take 1 tablet by mouth once daily   sodium bicarbonate 650 MG tablet Take by mouth.        Family History: Family History  Problem Relation Age of Onset   Hypertension Mother    Heart disease Mother    Heart disease Sister    Hypertension Sister    Prostate cancer Neg Hx    Bladder Cancer Neg Hx    Kidney cancer Neg Hx     Social History:  reports that he has been smoking cigarettes. He has a 54.00 pack-year smoking history. He has been exposed to tobacco smoke. He has never used smokeless tobacco. He reports that he does not currently use alcohol. He reports that he does not use drugs.   Physical Exam: BP (!) 110/58   Pulse (!) 59   Ht 5\' 11"  (1.803 m)   Wt 197 lb 2 oz (89.4 kg)   BMI 27.49 kg/m   Constitutional:  Alert and oriented, No acute distress. HEENT: [place] AT, moist mucus membranes.  Trachea midline, no masses. Neurologic: Grossly intact, no focal deficits, moving all 4 extremities. Psychiatric: Normal mood and affect.   Assessment & Plan:    1. CKD stage IV - Renal function  worsening. Seeing kidney doctor in a few weeks to start process for dialysis. Mentioned that some people cease producing urine while on dialysis.    2. Stress urinary incontinence - Minimal improvement from last visit. Has not gone to physical therapy. Emphasized the importance of strengthening the muscles and how it will help improve symptoms.   3. BPH with obstruction/LUTS  - Emptying  well.   4. Smoking cessation  - Encouraged him again today to stop smoking discussed how it can affect bladder health.    No follow-ups on file.    [facility] [address], [address] [place], [place] [address] [phone]

## 2023-03-28 ENCOUNTER — Ambulatory Visit: Payer: Medicare Other | Admitting: Urology

## 2023-04-02 DIAGNOSIS — E875 Hyperkalemia: Secondary | ICD-10-CM | POA: Diagnosis not present

## 2023-04-02 DIAGNOSIS — D631 Anemia in chronic kidney disease: Secondary | ICD-10-CM | POA: Diagnosis not present

## 2023-04-02 DIAGNOSIS — N281 Cyst of kidney, acquired: Secondary | ICD-10-CM | POA: Diagnosis not present

## 2023-04-02 DIAGNOSIS — R809 Proteinuria, unspecified: Secondary | ICD-10-CM | POA: Diagnosis not present

## 2023-04-02 DIAGNOSIS — N2581 Secondary hyperparathyroidism of renal origin: Secondary | ICD-10-CM | POA: Diagnosis not present

## 2023-04-02 DIAGNOSIS — N184 Chronic kidney disease, stage 4 (severe): Secondary | ICD-10-CM | POA: Diagnosis not present

## 2023-04-02 DIAGNOSIS — J449 Chronic obstructive pulmonary disease, unspecified: Secondary | ICD-10-CM | POA: Diagnosis not present

## 2023-04-02 DIAGNOSIS — I10 Essential (primary) hypertension: Secondary | ICD-10-CM | POA: Diagnosis not present

## 2023-04-02 DIAGNOSIS — N400 Enlarged prostate without lower urinary tract symptoms: Secondary | ICD-10-CM | POA: Diagnosis not present

## 2023-04-10 ENCOUNTER — Other Ambulatory Visit (INDEPENDENT_AMBULATORY_CARE_PROVIDER_SITE_OTHER): Payer: Self-pay | Admitting: Nurse Practitioner

## 2023-04-10 DIAGNOSIS — N185 Chronic kidney disease, stage 5: Secondary | ICD-10-CM

## 2023-04-15 ENCOUNTER — Ambulatory Visit (INDEPENDENT_AMBULATORY_CARE_PROVIDER_SITE_OTHER): Payer: Medicare HMO

## 2023-04-15 ENCOUNTER — Ambulatory Visit (INDEPENDENT_AMBULATORY_CARE_PROVIDER_SITE_OTHER): Payer: Medicare HMO | Admitting: Vascular Surgery

## 2023-04-15 ENCOUNTER — Encounter (INDEPENDENT_AMBULATORY_CARE_PROVIDER_SITE_OTHER): Payer: Self-pay | Admitting: Vascular Surgery

## 2023-04-15 VITALS — BP 137/65 | HR 76 | Resp 18 | Ht 71.0 in | Wt 198.0 lb

## 2023-04-15 DIAGNOSIS — I10 Essential (primary) hypertension: Secondary | ICD-10-CM | POA: Diagnosis not present

## 2023-04-15 DIAGNOSIS — N185 Chronic kidney disease, stage 5: Secondary | ICD-10-CM

## 2023-04-15 DIAGNOSIS — E782 Mixed hyperlipidemia: Secondary | ICD-10-CM | POA: Diagnosis not present

## 2023-04-15 NOTE — Progress Notes (Signed)
Patient ID: Tyrone Moore, male   DOB: 02/15/1951, 72 y.o.   MRN: [id number]  Chief Complaint  Patient presents with   New Patient (Initial Visit)    NP. v-map/UE art/consult. CKDV. singh    HPI Tyrone Moore is a 72 y.o. male.  I am asked to see the patient by Dr. Thedore Moore for evaluation of permanent dialysis access for his chronic kidney disease.  He now has stage V chronic kidney disease with a GFR 14.  He is still considering his options when it comes to whether or not to do dialysis.  He is right-hand dominant.  He does not have any immediate symptoms that would prompt dialysis such as uremia, volume overload, lethargy, or extremely poor appetite.  To evaluate his options for dialysis, noninvasive studies were performed today on both his arterial and venous circuits.  He had triphasic waveforms throughout the arterial circuit of both upper extremities.  His vein mapping showed adequate cephalic and basilic veins bilaterally including adequate cephalic vein in the forearm bilaterally.     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   Chronic bilateral low back pain without sciatica 03/26/2021   S/p 4 lumbar spine surgeries   Chronic obstructive pulmonary disease, unspecified (HCC) 07/11/2021   CKD (chronic kidney disease) stage 4, GFR 15-29 ml/min (HCC) 03/27/2021   COVID-19 virus infection 08/31/2021   Essential hypertension 03/26/2021   GERD (gastroesophageal reflux disease)    Hyperlipidemia    Hypertension    Mixed hyperlipidemia 03/26/2021   Thyroid disease     Past Surgical History:  Procedure Laterality Date   CYSTOSCOPY W/ RETROGRADES Bilateral 07/03/2021   Procedure: CYSTOSCOPY WITH RETROGRADE PYELOGRAM;  Surgeon: Tyrone Scotland, MD;  Location: [facility];  Service: Urology;  Laterality: Bilateral;    CYSTOSCOPY WITH LITHOLAPAXY N/A 07/03/2021   Procedure: CYSTOSCOPY WITH LITHOLAPAXY;  Surgeon: Tyrone Scotland, MD;  Location: [facility];  Service: Urology;  Laterality: N/A;   HOLEP-LASER ENUCLEATION OF THE PROSTATE WITH MORCELLATION N/A 07/03/2021   Procedure: HOLEP-LASER ENUCLEATION OF THE PROSTATE WITH MORCELLATION;  Surgeon: Tyrone Scotland, MD;  Location: [facility];  Service: Urology;  Laterality: N/A;   SPINE SURGERY  2015   X 4 surgery - lumbar disc     Family History  Problem Relation Age of Onset   Hypertension Mother    Heart disease Mother    Heart disease Sister    Hypertension Sister    Prostate cancer Neg Hx    Bladder Cancer Neg Hx    Kidney cancer Neg Hx       Social History   Tobacco Use   Smoking status: Heavy Smoker    Packs/day: 1.00    Years: 54.00    Additional pack years: 0.00    Total pack years: 54.00    Types: Cigarettes    Passive exposure: Current   Smokeless tobacco: Never  Vaping Use   Vaping Use: Never used  Substance Use Topics   Alcohol use: Not Currently    Comment: quit drinking at 19   Drug use: Never     No Known Allergies  Current Outpatient Medications  Medication Sig Dispense Refill   acetaminophen (TYLENOL) 500 MG tablet Take 1,000 mg by mouth every 6 (six) hours as needed for moderate pain.     amLODipine (NORVASC) 5  MG tablet Take 5 mg by mouth daily.     aspirin EC 81 MG EC tablet Take 1 tablet (81 mg total) by mouth daily. Swallow whole. 30 tablet 11   calcitRIOL (ROCALTROL) 0.25 MCG capsule Take 0.25 mcg by mouth daily.     carvedilol (COREG) 25 MG tablet Take 1 tablet (25 mg total) by mouth 2 (two) times daily with a meal. 200 tablet 1   Cholecalciferol (D3-1000 PO) Take 2,000 Units by mouth daily.     levothyroxine (EUTHYROX) 112 MCG tablet Take 1 tablet (112 mcg total) by mouth daily before breakfast. 100 tablet 1   MAGNESIUM PO Take 1,000 mg by mouth daily.     Melatonin 10 MG CAPS Take by mouth.     Multiple  Vitamins-Minerals (MULTIVITAL-M PO) Take 1 tablet by mouth daily.     omeprazole (PRILOSEC) 20 MG capsule Take 1 capsule (20 mg total) by mouth daily. 100 capsule 1   rosuvastatin (CRESTOR) 5 MG tablet Take 1 tablet by mouth once daily 100 tablet 2   sodium bicarbonate 650 MG tablet Take by mouth.     No current facility-administered medications for this visit.      REVIEW OF SYSTEMS (Negative unless checked)  Constitutional: [] Weight loss  [] Fever  [] Chills Cardiac: [] Chest pain   [] Chest pressure   [] Palpitations   [] Shortness of breath when laying flat   [] Shortness of breath at rest   [x] Shortness of breath with exertion. Vascular:  [] Pain in legs with walking   [] Pain in legs at rest   [] Pain in legs when laying flat   [] Claudication   [] Pain in feet when walking  [] Pain in feet at rest  [] Pain in feet when laying flat   [] History of DVT   [] Phlebitis   [x] Swelling in legs   [] Varicose veins   [] Non-healing ulcers Pulmonary:   [] Uses home oxygen   [] Productive cough   [] Hemoptysis   [] Wheeze  [] COPD   [] Asthma Neurologic:  [] Dizziness  [] Blackouts   [] Seizures   [] History of stroke   [] History of TIA  [] Aphasia   [] Temporary blindness   [] Dysphagia   [] Weakness or numbness in arms   [] Weakness or numbness in legs Musculoskeletal:  [] Arthritis   [] Joint swelling   [x] Joint pain   [] Low back pain Hematologic:  [] Easy bruising  [] Easy bleeding   [] Hypercoagulable state   [x] Anemic  [] Hepatitis Gastrointestinal:  [] Blood in stool   [] Vomiting blood  [] Gastroesophageal reflux/heartburn   [] Abdominal pain Genitourinary:  [x] Chronic kidney disease   [] Difficult urination  [] Frequent urination  [] Burning with urination   [] Hematuria Skin:  [] Rashes   [] Ulcers   [] Wounds Psychological:  [] History of anxiety   []  History of major depression.    Physical Exam BP 137/65 (BP Location: Right Arm)   Pulse 76   Resp 18   Ht 5\' 11"  (1.803 m)   Wt 198 lb (89.8 kg)   BMI 27.62 kg/m  Gen:  WD/WN,  NAD. Appears younger than stated age. Head: [place]/AT, No temporalis wasting.  Ear/Nose/Throat: Hearing grossly intact, nares w/o erythema or drainage, oropharynx w/o Erythema/Exudate Eyes: Conjunctiva clear, sclera non-icteric  Neck: trachea midline.  No JVD.  Pulmonary:  Good air movement, respirations not labored, no use of accessory muscles  Cardiac: RRR, no JVD Vascular:  Vessel Right Left  Radial Palpable Palpable  Gastrointestinal:. No masses, surgical incisions, or scars. Musculoskeletal: M/S 5/5 throughout.  Extremities without ischemic changes.  No deformity or atrophy. Mild LE edema. Neurologic: Sensation grossly intact in extremities.  Symmetrical.  Speech is fluent. Motor exam as listed above. Psychiatric: Judgment intact, Mood & affect appropriate for pt's clinical situation. Dermatologic: No rashes or ulcers noted.  No cellulitis or open wounds.    Radiology No results found.  Labs Recent Results (from the past 2160 hour(s))  CBC and differential     Status: Abnormal   Collection Time: 02/26/23 12:00 AM  Result Value Ref Range   Hemoglobin 10.8 (A) 13.5 - 17.5   HCT 33 (A) 41 - 53   Platelets 153 150 - 400 K/uL   WBC 7.4   Basic metabolic panel     Status: Abnormal   Collection Time: 02/26/23 12:00 AM  Result Value Ref Range   BUN 46 (A) 4 - 21   CO2 21 13 - 22   Creatinine 4.8 (A) 0.6 - 1.3   Potassium 4.9 3.5 - 5.1 mEq/L   Sodium 141 137 - 147   Chloride 111 (A) 99 - 108  Comprehensive metabolic panel     Status: None   Collection Time: 02/26/23 12:00 AM  Result Value Ref Range   eGFR 12    Calcium 9.7 8.7 - 10.7  Lipid panel     Status: Abnormal   Collection Time: 03/19/23  8:36 AM  Result Value Ref Range   Cholesterol, Total 146 100 - 199 mg/dL   Triglycerides 161 (H) 0 - 149 mg/dL   HDL 32 (L) >09 mg/dL   VLDL Cholesterol Cal 31 5 - 40 mg/dL   LDL Chol Calc (NIH) 83 0 - 99 mg/dL   Chol/HDL Ratio 4.6 0.0 -  5.0 ratio    Comment:                                   T. Chol/HDL Ratio                                             Men  Women                               1/2 Avg.Risk  3.4    3.3                                   Avg.Risk  5.0    4.4                                2X Avg.Risk  9.6    7.1                                3X Avg.Risk 23.4   11.0   Hemoglobin A1c     Status: Abnormal   Collection Time: 03/19/23  8:36 AM  Result Value Ref Range   Hgb A1c MFr Bld 5.8 (H) 4.8 - 5.6 %    Comment:  Prediabetes: 5.7 - 6.4          Diabetes: >6.4          Glycemic control for adults with diabetes: <7.0    Est. average glucose Bld gHb Est-mCnc 120 mg/dL  Hepatic function panel     Status: None   Collection Time: 03/19/23  8:36 AM  Result Value Ref Range   Total Protein 7.0 6.0 - 8.5 g/dL   Albumin 4.2 3.8 - 4.8 g/dL   Bilirubin Total 0.3 0.0 - 1.2 mg/dL   Bilirubin, Direct 1.61 0.00 - 0.40 mg/dL   Alkaline Phosphatase 119 44 - 121 IU/L   AST 21 0 - 40 IU/L   ALT 21 0 - 44 IU/L  Hepatitis C antibody     Status: None   Collection Time: 03/19/23  8:36 AM  Result Value Ref Range   Hep C Virus Ab Non Reactive Non Reactive    Comment: HCV antibody alone does not differentiate between previously resolved infection and active infection. Equivocal and Reactive HCV antibody results should be followed up with an HCV RNA test to support the diagnosis of active HCV infection.   TSH + free T4     Status: None   Collection Time: 03/19/23  8:36 AM  Result Value Ref Range   TSH 0.712 0.450 - 4.500 uIU/mL   Free T4 1.29 0.82 - 1.77 ng/dL  Bladder Scan (Post Void Residual) in office     Status: None   Collection Time: 03/21/23 11:37 AM  Result Value Ref Range   Scan Result 7 ml     Assessment/Plan:  Chronic kidney disease (CKD), stage V (HCC) To evaluate his options for dialysis, noninvasive studies were performed today on both his arterial and venous circuits.  He had triphasic  waveforms throughout the arterial circuit of both upper extremities.  His vein mapping showed adequate cephalic and basilic veins bilaterally including adequate cephalic vein in the forearm bilaterally.   Given these findings, he has adequate anatomy for radiocephalic AV fistula on either side.  Since he is right arm dominant, recommend this on the left.  We discussed this takes 6 to 8 weeks before to be usable for dialysis.  We discussed that having this put in does not put him on dialysis any sooner or keep him off, it is just getting him ready for this.  Says he still not sure if he wants to do dialysis but thinks it is reasonable to go ahead and get the fistula in place in case he needs to start dialysis in the next few months.  I think that is a reasonable option.  A left radiocephalic AV fistula placed in the near future at his convenience.  Essential hypertension An underlying cause of renal failure and blood pressure control important in reducing the progression of atherosclerotic disease. On appropriate oral medications.   Mixed hyperlipidemia lipid control important in reducing the progression of atherosclerotic disease. Continue statin therapy      [name] 04/15/2023, 2:22 PM   This note was created with Dragon medical transcription system.  Any errors from dictation are unintentional.

## 2023-04-15 NOTE — Patient Instructions (Signed)
AV Fistula Placement  Arteriovenous (AV) fistula placement is a surgical procedure to create a connection between a blood vessel that carries blood away from the heart (artery) and a blood vessel that returns blood to the heart (vein). This connection is called a fistula. It is often made in the forearm or upper arm. You may need this procedure if you are getting hemodialysis treatments for kidney disease. An AV fistula makes your vein larger and stronger over several months. This makes the vein a safe and easy spot to insert the needles that are used for hemodialysis. Tell a health care provider about: Any allergies you have. All medicines you are taking, including vitamins, herbs, eye drops, creams, and over-the-counter medicines. Any problems you or family members have had with anesthetic medicines. Any blood disorders you have. Any surgeries you have had. Any medical conditions you have or have had in the past. Whether you are pregnant or may be pregnant. What are the risks? Generally, this is a safe procedure. However, problems may occur, including: Infection. Blood clot. Reduced blood flow (stenosis). Weakening or ballooning out of the fistula (aneurysm). Bleeding. Allergic reactions to medicines. Nerve damage. Swelling near the fistula. Failure of the procedure. What happens before the procedure? Staying hydrated Follow instructions from your health care provider about hydration, which may include: Up to 2 hours before the procedure - you may continue to drink clear liquids, such as water, clear fruit juice, black coffee, and plain tea.  Eating and drinking restrictions Follow instructions from your health care provider about eating and drinking, which may include: 8 hours before the procedure - stop eating heavy meals or foods, such as meat, fried foods, or fatty foods. 6 hours before the procedure - stop eating light meals or foods, such as toast or cereal. 6 hours before the  procedure - stop drinking milk or drinks that contain milk. 2 hours before the procedure - stop drinking clear liquids. Medicines Ask your health care provider about: Changing or stopping your regular medicines. This is especially important if you are taking diabetes medicines or blood thinners. Taking medicines such as aspirin and ibuprofen. These medicines can thin your blood. Do not take these medicines unless your health care provider tells you to take them. Taking over-the-counter medicines, vitamins, herbs, and supplements. General instructions Do not use any products that contain nicotine or tobacco for at least 4 weeks before the procedure. These products include cigarettes, chewing tobacco, and vaping devices, such as e-cigarettes. If you need help quitting, ask your health care provider. Imaging tests of your arm may be done to find the best place for the fistula. Plan to have a responsible adult take you home from the hospital or clinic. Ask your health care provider: How your surgery site will be marked. What steps will be taken to help prevent infection. These steps may include: Removing hair at the surgery site. Washing skin with a germ-killing soap. Taking antibiotic medicine. What happens during the procedure? An IV will be inserted into one of your veins. You will be given one or more of the following: A medicine to help you relax (sedative). A medicine to numb the area (local anesthetic). A medicine to make you fall asleep (general anesthetic). A medicine that is injected into an area of your body to numb everything below the injection site (regional anesthetic). An incision will be made on the inner side of your arm. A vein and an artery will be opened and connected with stitches (  sutures). The incision will be closed with sutures or clips. A bandage (dressing) will be placed over the area. The procedure may vary among health care providers and hospitals. What happens  after the procedure? Your blood pressure, heart rate, breathing rate, and blood oxygen level may be monitored until you leave the hospital or clinic. Your fistula site will be checked for bleeding or swelling. You will be given pain medicine as needed. If you were given a sedative during the procedure, it can affect you for several hours. Do not drive or operate machinery until your health care provider says that it is safe. Summary Arteriovenous (AV) fistula placement is a surgical procedure to create a connection between a blood vessel that carries blood away from your heart (artery) and a blood vessel that returns blood to your heart (vein). This connection is called a fistula. Follow instructions from your health care provider about eating and drinking before the procedure. Ask your health care provider about changing or stopping your regular medicines before the procedure. This is especially important if you are taking diabetes medicines or blood thinners. Plan to have a responsible adult take you home from the hospital or clinic. This information is not intended to replace advice given to you by your health care provider. Make sure you discuss any questions you have with your health care provider. Document Revised: 06/07/2020 Document Reviewed: 06/07/2020 [employer] Patient Education  2024 [employer]  

## 2023-04-15 NOTE — H&P (View-Only) (Signed)
  Patient ID: Tyrone Moore, male   DOB: 05/15/1951, 71 y.o.   MRN: [id number]  Chief Complaint  Patient presents with   New Patient (Initial Visit)    NP. v-map/UE art/consult. CKDV. singh    HPI Tyrone Moore is a 71 y.o. male.  I am asked to see the patient by Dr. Singh for evaluation of permanent dialysis access for his chronic kidney disease.  He now has stage V chronic kidney disease with a GFR 14.  He is still considering his options when it comes to whether or not to do dialysis.  He is right-hand dominant.  He does not have any immediate symptoms that would prompt dialysis such as uremia, volume overload, lethargy, or extremely poor appetite.  To evaluate his options for dialysis, noninvasive studies were performed today on both his arterial and venous circuits.  He had triphasic waveforms throughout the arterial circuit of both upper extremities.  His vein mapping showed adequate cephalic and basilic veins bilaterally including adequate cephalic vein in the forearm bilaterally.     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   Chronic bilateral low back pain without sciatica 03/26/2021   S/p 4 lumbar spine surgeries   Chronic obstructive pulmonary disease, unspecified (HCC) 07/11/2021   CKD (chronic kidney disease) stage 4, GFR 15-29 ml/min (HCC) 03/27/2021   COVID-19 virus infection 08/31/2021   Essential hypertension 03/26/2021   GERD (gastroesophageal reflux disease)    Hyperlipidemia    Hypertension    Mixed hyperlipidemia 03/26/2021   Thyroid disease     Past Surgical History:  Procedure Laterality Date   CYSTOSCOPY W/ RETROGRADES Bilateral 07/03/2021   Procedure: CYSTOSCOPY WITH RETROGRADE PYELOGRAM;  Surgeon: Brandon, Ashley, MD;  Location: [facility];  Service: Urology;  Laterality: Bilateral;    CYSTOSCOPY WITH LITHOLAPAXY N/A 07/03/2021   Procedure: CYSTOSCOPY WITH LITHOLAPAXY;  Surgeon: Brandon, Ashley, MD;  Location: [facility];  Service: Urology;  Laterality: N/A;   HOLEP-LASER ENUCLEATION OF THE PROSTATE WITH MORCELLATION N/A 07/03/2021   Procedure: HOLEP-LASER ENUCLEATION OF THE PROSTATE WITH MORCELLATION;  Surgeon: Brandon, Ashley, MD;  Location: [facility];  Service: Urology;  Laterality: N/A;   SPINE SURGERY  2015   X 4 surgery - lumbar disc     Family History  Problem Relation Age of Onset   Hypertension Mother    Heart disease Mother    Heart disease Sister    Hypertension Sister    Prostate cancer Neg Hx    Bladder Cancer Neg Hx    Kidney cancer Neg Hx       Social History   Tobacco Use   Smoking status: Heavy Smoker    Packs/day: 1.00    Years: 54.00    Additional pack years: 0.00    Total pack years: 54.00    Types: Cigarettes    Passive exposure: Current   Smokeless tobacco: Never  Vaping Use   Vaping Use: Never used  Substance Use Topics   Alcohol use: Not Currently    Comment: quit drinking at 19   Drug use: Never     No Known Allergies  Current Outpatient Medications  Medication Sig Dispense Refill   acetaminophen (TYLENOL) 500 MG tablet Take 1,000 mg by mouth every 6 (six) hours as needed for moderate pain.     amLODipine (NORVASC) 5   MG tablet Take 5 mg by mouth daily.     aspirin EC 81 MG EC tablet Take 1 tablet (81 mg total) by mouth daily. Swallow whole. 30 tablet 11   calcitRIOL (ROCALTROL) 0.25 MCG capsule Take 0.25 mcg by mouth daily.     carvedilol (COREG) 25 MG tablet Take 1 tablet (25 mg total) by mouth 2 (two) times daily with a meal. 200 tablet 1   Cholecalciferol (D3-1000 PO) Take 2,000 Units by mouth daily.     levothyroxine (EUTHYROX) 112 MCG tablet Take 1 tablet (112 mcg total) by mouth daily before breakfast. 100 tablet 1   MAGNESIUM PO Take 1,000 mg by mouth daily.     Melatonin 10 MG CAPS Take by mouth.     Multiple  Vitamins-Minerals (MULTIVITAL-M PO) Take 1 tablet by mouth daily.     omeprazole (PRILOSEC) 20 MG capsule Take 1 capsule (20 mg total) by mouth daily. 100 capsule 1   rosuvastatin (CRESTOR) 5 MG tablet Take 1 tablet by mouth once daily 100 tablet 2   sodium bicarbonate 650 MG tablet Take by mouth.     No current facility-administered medications for this visit.      REVIEW OF SYSTEMS (Negative unless checked)  Constitutional: []Weight loss  []Fever  []Chills Cardiac: []Chest pain   []Chest pressure   []Palpitations   []Shortness of breath when laying flat   []Shortness of breath at rest   [x]Shortness of breath with exertion. Vascular:  []Pain in legs with walking   []Pain in legs at rest   []Pain in legs when laying flat   []Claudication   []Pain in feet when walking  []Pain in feet at rest  []Pain in feet when laying flat   []History of DVT   []Phlebitis   [x]Swelling in legs   []Varicose veins   []Non-healing ulcers Pulmonary:   []Uses home oxygen   []Productive cough   []Hemoptysis   []Wheeze  []COPD   []Asthma Neurologic:  []Dizziness  []Blackouts   []Seizures   []History of stroke   []History of TIA  []Aphasia   []Temporary blindness   []Dysphagia   []Weakness or numbness in arms   []Weakness or numbness in legs Musculoskeletal:  []Arthritis   []Joint swelling   [x]Joint pain   []Low back pain Hematologic:  []Easy bruising  []Easy bleeding   []Hypercoagulable state   [x]Anemic  []Hepatitis Gastrointestinal:  []Blood in stool   []Vomiting blood  []Gastroesophageal reflux/heartburn   []Abdominal pain Genitourinary:  [x]Chronic kidney disease   []Difficult urination  []Frequent urination  []Burning with urination   []Hematuria Skin:  []Rashes   []Ulcers   []Wounds Psychological:  []History of anxiety   [] History of major depression.    Physical Exam BP 137/65 (BP Location: Right Arm)   Pulse 76   Resp 18   Ht 5' 11" (1.803 m)   Wt 198 lb (89.8 kg)   BMI 27.62 kg/m  Gen:  WD/WN,  NAD. Appears younger than stated age. Head: [place]/AT, No temporalis wasting.  Ear/Nose/Throat: Hearing grossly intact, nares w/o erythema or drainage, oropharynx w/o Erythema/Exudate Eyes: Conjunctiva clear, sclera non-icteric  Neck: trachea midline.  No JVD.  Pulmonary:  Good air movement, respirations not labored, no use of accessory muscles  Cardiac: RRR, no JVD Vascular:  Vessel Right Left  Radial Palpable Palpable                                     Gastrointestinal:. No masses, surgical incisions, or scars. Musculoskeletal: M/S 5/5 throughout.  Extremities without ischemic changes.  No deformity or atrophy. Mild LE edema. Neurologic: Sensation grossly intact in extremities.  Symmetrical.  Speech is fluent. Motor exam as listed above. Psychiatric: Judgment intact, Mood & affect appropriate for pt's clinical situation. Dermatologic: No rashes or ulcers noted.  No cellulitis or open wounds.    Radiology No results found.  Labs Recent Results (from the past 2160 hour(s))  CBC and differential     Status: Abnormal   Collection Time: 02/26/23 12:00 AM  Result Value Ref Range   Hemoglobin 10.8 (A) 13.5 - 17.5   HCT 33 (A) 41 - 53   Platelets 153 150 - 400 K/uL   WBC 7.4   Basic metabolic panel     Status: Abnormal   Collection Time: 02/26/23 12:00 AM  Result Value Ref Range   BUN 46 (A) 4 - 21   CO2 21 13 - 22   Creatinine 4.8 (A) 0.6 - 1.3   Potassium 4.9 3.5 - 5.1 mEq/L   Sodium 141 137 - 147   Chloride 111 (A) 99 - 108  Comprehensive metabolic panel     Status: None   Collection Time: 02/26/23 12:00 AM  Result Value Ref Range   eGFR 12    Calcium 9.7 8.7 - 10.7  Lipid panel     Status: Abnormal   Collection Time: 03/19/23  8:36 AM  Result Value Ref Range   Cholesterol, Total 146 100 - 199 mg/dL   Triglycerides 180 (H) 0 - 149 mg/dL   HDL 32 (L) >39 mg/dL   VLDL Cholesterol Cal 31 5 - 40 mg/dL   LDL Chol Calc (NIH) 83 0 - 99 mg/dL   Chol/HDL Ratio 4.6 0.0 -  5.0 ratio    Comment:                                   T. Chol/HDL Ratio                                             Men  Women                               1/2 Avg.Risk  3.4    3.3                                   Avg.Risk  5.0    4.4                                2X Avg.Risk  9.6    7.1                                3X Avg.Risk 23.4   11.0   Hemoglobin A1c     Status: Abnormal   Collection Time: 03/19/23  8:36 AM  Result Value Ref Range   Hgb A1c MFr Bld 5.8 (H) 4.8 - 5.6 %    Comment:            Prediabetes: 5.7 - 6.4          Diabetes: >6.4          Glycemic control for adults with diabetes: <7.0    Est. average glucose Bld gHb Est-mCnc 120 mg/dL  Hepatic function panel     Status: None   Collection Time: 03/19/23  8:36 AM  Result Value Ref Range   Total Protein 7.0 6.0 - 8.5 g/dL   Albumin 4.2 3.8 - 4.8 g/dL   Bilirubin Total 0.3 0.0 - 1.2 mg/dL   Bilirubin, Direct 0.10 0.00 - 0.40 mg/dL   Alkaline Phosphatase 119 44 - 121 IU/L   AST 21 0 - 40 IU/L   ALT 21 0 - 44 IU/L  Hepatitis C antibody     Status: None   Collection Time: 03/19/23  8:36 AM  Result Value Ref Range   Hep C Virus Ab Non Reactive Non Reactive    Comment: HCV antibody alone does not differentiate between previously resolved infection and active infection. Equivocal and Reactive HCV antibody results should be followed up with an HCV RNA test to support the diagnosis of active HCV infection.   TSH + free T4     Status: None   Collection Time: 03/19/23  8:36 AM  Result Value Ref Range   TSH 0.712 0.450 - 4.500 uIU/mL   Free T4 1.29 0.82 - 1.77 ng/dL  Bladder Scan (Post Void Residual) in office     Status: None   Collection Time: 03/21/23 11:37 AM  Result Value Ref Range   Scan Result 7 ml     Assessment/Plan:  Chronic kidney disease (CKD), stage V (HCC) To evaluate his options for dialysis, noninvasive studies were performed today on both his arterial and venous circuits.  He had triphasic  waveforms throughout the arterial circuit of both upper extremities.  His vein mapping showed adequate cephalic and basilic veins bilaterally including adequate cephalic vein in the forearm bilaterally.   Given these findings, he has adequate anatomy for radiocephalic AV fistula on either side.  Since he is right arm dominant, recommend this on the left.  We discussed this takes 6 to 8 weeks before to be usable for dialysis.  We discussed that having this put in does not put him on dialysis any sooner or keep him off, it is just getting him ready for this.  Says he still not sure if he wants to do dialysis but thinks it is reasonable to go ahead and get the fistula in place in case he needs to start dialysis in the next few months.  I think that is a reasonable option.  A left radiocephalic AV fistula placed in the near future at his convenience.  Essential hypertension An underlying cause of renal failure and blood pressure control important in reducing the progression of atherosclerotic disease. On appropriate oral medications.   Mixed hyperlipidemia lipid control important in reducing the progression of atherosclerotic disease. Continue statin therapy      Aleayah Chico 04/15/2023, 2:22 PM   This note was created with Dragon medical transcription system.  Any errors from dictation are unintentional.   

## 2023-04-15 NOTE — Assessment & Plan Note (Signed)
An underlying cause of renal failure and blood pressure control important in reducing the progression of atherosclerotic disease. On appropriate oral medications.  

## 2023-04-15 NOTE — Assessment & Plan Note (Signed)
lipid control important in reducing the progression of atherosclerotic disease. Continue statin therapy  

## 2023-04-15 NOTE — Assessment & Plan Note (Signed)
To evaluate his options for dialysis, noninvasive studies were performed today on both his arterial and venous circuits.  He had triphasic waveforms throughout the arterial circuit of both upper extremities.  His vein mapping showed adequate cephalic and basilic veins bilaterally including adequate cephalic vein in the forearm bilaterally.   Given these findings, he has adequate anatomy for radiocephalic AV fistula on either side.  Since he is right arm dominant, recommend this on the left.  We discussed this takes 6 to 8 weeks before to be usable for dialysis.  We discussed that having this put in does not put him on dialysis any sooner or keep him off, it is just getting him ready for this.  Says he still not sure if he wants to do dialysis but thinks it is reasonable to go ahead and get the fistula in place in case he needs to start dialysis in the next few months.  I think that is a reasonable option.  A left radiocephalic AV fistula placed in the near future at his convenience.

## 2023-04-21 DIAGNOSIS — R69 Illness, unspecified: Secondary | ICD-10-CM | POA: Diagnosis not present

## 2023-04-24 ENCOUNTER — Other Ambulatory Visit (INDEPENDENT_AMBULATORY_CARE_PROVIDER_SITE_OTHER): Payer: Self-pay | Admitting: Nurse Practitioner

## 2023-04-24 ENCOUNTER — Telehealth (INDEPENDENT_AMBULATORY_CARE_PROVIDER_SITE_OTHER): Payer: Self-pay

## 2023-04-24 DIAGNOSIS — N185 Chronic kidney disease, stage 5: Secondary | ICD-10-CM

## 2023-04-24 NOTE — Telephone Encounter (Signed)
Spoke with the patient and he is scheduled with Dr. Wyn Quaker on 05/01/23 for a left radiocephalic AV fistula at [facility]. Pre-op is on 04/25/23 at 1:00 pm at the [facility]. Pre-surgical instructions were discussed and will be sent to Mychart and mailed.

## 2023-04-25 ENCOUNTER — Encounter
Admission: RE | Admit: 2023-04-25 | Discharge: 2023-04-25 | Disposition: A | Discharge status: Home / Self Care | Payer: Medicare HMO | Source: Ambulatory Visit | Attending: Vascular Surgery | Admitting: Vascular Surgery

## 2023-04-25 DIAGNOSIS — N185 Chronic kidney disease, stage 5: Secondary | ICD-10-CM | POA: Diagnosis not present

## 2023-04-25 DIAGNOSIS — Z01818 Encounter for other preprocedural examination: Secondary | ICD-10-CM | POA: Insufficient documentation

## 2023-04-25 HISTORY — DX: Prediabetes: R73.03

## 2023-04-25 HISTORY — DX: Nicotine dependence, unspecified, uncomplicated: F17.200

## 2023-04-25 HISTORY — DX: Chronic kidney disease, stage 5: N18.5

## 2023-04-25 HISTORY — DX: Proteinuria, unspecified: R80.9

## 2023-04-25 HISTORY — DX: Cyst of kidney, acquired: N28.1

## 2023-04-25 HISTORY — DX: Hyperparathyroidism, unspecified: E21.3

## 2023-04-25 LAB — TYPE AND SCREEN
ABO/RH(D): B POS
Antibody Screen: NEGATIVE

## 2023-04-25 NOTE — Patient Instructions (Addendum)
Your procedure is scheduled on:05-01-23 Thursday Report to the Registration Desk on the 1st floor of the [facility].Then proceed to the 2nd floor Surgery Desk  To find out your arrival time, please call [phone] between 1PM - 3PM on:04-30-23 Wednesday If your arrival time is 6:00 am, do not arrive before that time as the [facility] entrance doors do not open until 6:00 am.  REMEMBER: Instructions that are not followed completely may result in serious medical risk, up to and including death; or upon the discretion of your surgeon and anesthesiologist your surgery may need to be rescheduled.  Do not eat food OR drink any liquids after midnight the night before surgery.  No gum chewing or hard candies.  One week prior to surgery: Stop Anti-inflammatories (NSAIDS) such as Advil, Aleve, Ibuprofen, Motrin, Naproxen, Naprosyn and Aspirin based products such as Excedrin, Goody's Powder, BC Powder.You may however, take Tylenol if needed for pain up until the day of surgery. Stop ANY OVER THE COUNTER supplements/vitamins NOW (04-25-23) until after surgery (Vitamin D3, Magnesium, Multivitamin)   Continue taking all prescribed medications   TAKE ONLY THESE MEDICATIONS THE MORNING OF SURGERY WITH A SIP OF WATER: -carvedilol (COREG)  -levothyroxine (EUTHYROX)  -omeprazole (PRILOSEC)  Continue your 81 mg Aspirin up until the day prior to surgery-Do NOT take the morning of surgery  No Alcohol for 24 hours before or after surgery.  No Smoking including e-cigarettes for 24 hours before surgery.  No chewable tobacco products for at least 6 hours before surgery.  No nicotine patches on the day of surgery.  Do not use any "recreational" drugs for at least a week (preferably 2 weeks) before your surgery.  Please be advised that the combination of cocaine and anesthesia may have negative outcomes, up to and including death. If you test positive for cocaine, your surgery will be cancelled.  On the  morning of surgery brush your teeth with toothpaste and water, you may rinse your mouth with mouthwash if you wish. Do not swallow any toothpaste or mouthwash.  Use CHG Soap as directed on instruction sheet.  Do not wear jewelry, make-up, hairpins, clips or nail polish.  Do not wear lotions, powders, or perfumes.   Do not shave body hair from the neck down 48 hours before surgery.  Contact lenses, hearing aids and dentures may not be worn into surgery.  Do not bring valuables to the hospital. [facility] is not responsible for any missing/lost belongings or valuables.   Notify your doctor if there is any change in your medical condition (cold, fever, infection).  Wear comfortable clothing (specific to your surgery type) to the hospital.  After surgery, you can help prevent lung complications by doing breathing exercises.  Take deep breaths and cough every 1-2 hours. Your doctor may order a device called an Incentive Spirometer to help you take deep breaths. When coughing or sneezing, hold a pillow firmly against your incision with both hands. This is called "splinting." Doing this helps protect your incision. It also decreases belly discomfort.  If you are being admitted to the hospital overnight, leave your suitcase in the car. After surgery it may be brought to your room.  In case of increased patient census, it may be necessary for you, the patient, to continue your postoperative care in the Same Day Surgery department.  If you are being discharged the day of surgery, you will not be allowed to drive home. You will need a responsible individual to drive  you home and stay with you for 24 hours after surgery.   If you are taking public transportation, you will need to have a responsible individual with you.  Please call the Pre-admissions Testing Dept. at [phone] if you have any questions about these instructions.  Surgery Visitation Policy:  Patients having surgery or a  procedure may have two visitors.  Children under the age of 70 must have an adult with them who is not the patient.     Preparing for Surgery with CHLORHEXIDINE GLUCONATE (CHG) Soap  Chlorhexidine Gluconate (CHG) Soap  o An antiseptic cleaner that kills germs and bonds with the skin to continue killing germs even after washing  o Used for showering the night before surgery and morning of surgery  Before surgery, you can play an important role by reducing the number of germs on your skin.  CHG (Chlorhexidine gluconate) soap is an antiseptic cleanser which kills germs and bonds with the skin to continue killing germs even after washing.  Please do not use if you have an allergy to CHG or antibacterial soaps. If your skin becomes reddened/irritated stop using the CHG.  1. Shower the NIGHT BEFORE SURGERY and the MORNING OF SURGERY with CHG soap.  2. If you choose to wash your hair, wash your hair first as usual with your normal shampoo.  3. After shampooing, rinse your hair and body thoroughly to remove the shampoo.  4. Use CHG as you would any other liquid soap. You can apply CHG directly to the skin and wash gently with a scrungie or a clean washcloth.  5. Apply the CHG soap to your body only from the neck down. Do not use on open wounds or open sores. Avoid contact with your eyes, ears, mouth, and genitals (private parts). Wash face and genitals (private parts) with your normal soap.  6. Wash thoroughly, paying special attention to the area where your surgery will be performed.  7. Thoroughly rinse your body with warm water.  8. Do not shower/wash with your normal soap after using and rinsing off the CHG soap.  9. Pat yourself dry with a clean towel.  10. Wear clean pajamas to bed the night before surgery.  12. Place clean sheets on your bed the night of your first shower and do not sleep with pets.  13. Shower again with the CHG soap on the day of surgery prior to arriving at  the hospital.  14. Do not apply any deodorants/lotions/powders.  15. Please wear clean clothes to the hospital.

## 2023-04-30 DIAGNOSIS — E875 Hyperkalemia: Secondary | ICD-10-CM | POA: Diagnosis not present

## 2023-04-30 DIAGNOSIS — D631 Anemia in chronic kidney disease: Secondary | ICD-10-CM | POA: Diagnosis not present

## 2023-04-30 DIAGNOSIS — R809 Proteinuria, unspecified: Secondary | ICD-10-CM | POA: Diagnosis not present

## 2023-04-30 DIAGNOSIS — N281 Cyst of kidney, acquired: Secondary | ICD-10-CM | POA: Diagnosis not present

## 2023-04-30 DIAGNOSIS — N184 Chronic kidney disease, stage 4 (severe): Secondary | ICD-10-CM | POA: Diagnosis not present

## 2023-04-30 DIAGNOSIS — N2581 Secondary hyperparathyroidism of renal origin: Secondary | ICD-10-CM | POA: Diagnosis not present

## 2023-04-30 DIAGNOSIS — J449 Chronic obstructive pulmonary disease, unspecified: Secondary | ICD-10-CM | POA: Diagnosis not present

## 2023-04-30 DIAGNOSIS — I10 Essential (primary) hypertension: Secondary | ICD-10-CM | POA: Diagnosis not present

## 2023-04-30 DIAGNOSIS — N400 Enlarged prostate without lower urinary tract symptoms: Secondary | ICD-10-CM | POA: Diagnosis not present

## 2023-04-30 MED ORDER — CEFAZOLIN SODIUM-DEXTROSE 2-4 GM/100ML-% IV SOLN
2.0000 g | INTRAVENOUS | Status: AC
  Administered 2023-05-01: 2 g via INTRAVENOUS

## 2023-04-30 MED ORDER — CHLORHEXIDINE GLUCONATE CLOTH 2 % EX PADS: 6.0000 | MEDICATED_PAD | Freq: Once | CUTANEOUS | Status: DC

## 2023-04-30 MED ORDER — ORAL CARE MOUTH RINSE: 15.0000 mL | Freq: Once | OROMUCOSAL | Status: AC

## 2023-04-30 MED ORDER — CHLORHEXIDINE GLUCONATE 0.12 % MT SOLN
15.0000 mL | Freq: Once | OROMUCOSAL | Status: AC
  Administered 2023-05-01: 15 mL via OROMUCOSAL

## 2023-04-30 MED ORDER — SODIUM CHLORIDE 0.9 % IV SOLN: INTRAVENOUS | Status: DC

## 2023-05-01 ENCOUNTER — Encounter
Admission: RE | Disposition: A | Discharge status: Home / Self Care | Payer: Self-pay | Source: Home / Self Care | Attending: Vascular Surgery

## 2023-05-01 ENCOUNTER — Ambulatory Visit: Payer: Medicare HMO | Admitting: General Practice

## 2023-05-01 ENCOUNTER — Other Ambulatory Visit: Payer: Self-pay

## 2023-05-01 ENCOUNTER — Ambulatory Visit: Payer: Medicare HMO | Admitting: Urgent Care

## 2023-05-01 ENCOUNTER — Ambulatory Visit
Admission: RE | Admit: 2023-05-01 | Discharge: 2023-05-01 | Disposition: A | Discharge status: Home / Self Care | Payer: Medicare HMO | Attending: Vascular Surgery | Admitting: Vascular Surgery

## 2023-05-01 ENCOUNTER — Encounter: Payer: Self-pay | Admitting: Vascular Surgery

## 2023-05-01 DIAGNOSIS — Z09 Encounter for follow-up examination after completed treatment for conditions other than malignant neoplasm: Secondary | ICD-10-CM | POA: Insufficient documentation

## 2023-05-01 DIAGNOSIS — F17210 Nicotine dependence, cigarettes, uncomplicated: Secondary | ICD-10-CM | POA: Insufficient documentation

## 2023-05-01 DIAGNOSIS — E1122 Type 2 diabetes mellitus with diabetic chronic kidney disease: Secondary | ICD-10-CM | POA: Insufficient documentation

## 2023-05-01 DIAGNOSIS — Z8673 Personal history of transient ischemic attack (TIA), and cerebral infarction without residual deficits: Secondary | ICD-10-CM | POA: Insufficient documentation

## 2023-05-01 DIAGNOSIS — N185 Chronic kidney disease, stage 5: Secondary | ICD-10-CM | POA: Insufficient documentation

## 2023-05-01 DIAGNOSIS — Z8616 Personal history of COVID-19: Secondary | ICD-10-CM | POA: Diagnosis not present

## 2023-05-01 DIAGNOSIS — E785 Hyperlipidemia, unspecified: Secondary | ICD-10-CM | POA: Insufficient documentation

## 2023-05-01 DIAGNOSIS — J449 Chronic obstructive pulmonary disease, unspecified: Secondary | ICD-10-CM | POA: Diagnosis not present

## 2023-05-01 DIAGNOSIS — I7389 Other specified peripheral vascular diseases: Secondary | ICD-10-CM | POA: Diagnosis not present

## 2023-05-01 DIAGNOSIS — E039 Hypothyroidism, unspecified: Secondary | ICD-10-CM | POA: Diagnosis not present

## 2023-05-01 DIAGNOSIS — D631 Anemia in chronic kidney disease: Secondary | ICD-10-CM | POA: Diagnosis not present

## 2023-05-01 DIAGNOSIS — Z992 Dependence on renal dialysis: Secondary | ICD-10-CM | POA: Diagnosis not present

## 2023-05-01 DIAGNOSIS — I120 Hypertensive chronic kidney disease with stage 5 chronic kidney disease or end stage renal disease: Secondary | ICD-10-CM | POA: Diagnosis not present

## 2023-05-01 HISTORY — PX: AV FISTULA PLACEMENT: SHX1204

## 2023-05-01 LAB — ABO/RH: ABO/RH(D): B POS

## 2023-05-01 SURGERY — ARTERIOVENOUS (AV) FISTULA CREATION
Anesthesia: General | Laterality: Left

## 2023-05-01 MED ORDER — OXYCODONE HCL 5 MG/5ML PO SOLN: 5.0000 mg | Freq: Once | ORAL | Status: DC | PRN

## 2023-05-01 MED ORDER — HEPARIN 5000 UNITS IN NS 1000 ML (FLUSH)
INTRAMUSCULAR | Status: DC | PRN
  Administered 2023-05-01: 501 mL via INTRAMUSCULAR

## 2023-05-01 MED ORDER — MIDAZOLAM HCL 5 MG/5ML IJ SOLN
INTRAMUSCULAR | Status: DC | PRN
  Administered 2023-05-01: 2 mg via INTRAVENOUS

## 2023-05-01 MED ORDER — CHLORHEXIDINE GLUCONATE 0.12 % MT SOLN
OROMUCOSAL | Status: AC
  Filled 2023-05-01: qty 15

## 2023-05-01 MED ORDER — DEXAMETHASONE SODIUM PHOSPHATE 10 MG/ML IJ SOLN
INTRAMUSCULAR | Status: DC | PRN
  Administered 2023-05-01: 5 mg via INTRAVENOUS

## 2023-05-01 MED ORDER — ROCURONIUM BROMIDE 10 MG/ML (PF) SYRINGE
PREFILLED_SYRINGE | INTRAVENOUS | Status: AC
  Filled 2023-05-01: qty 10

## 2023-05-01 MED ORDER — EPINEPHRINE PF 1 MG/ML IJ SOLN
INTRAMUSCULAR | Status: AC
  Filled 2023-05-01: qty 1

## 2023-05-01 MED ORDER — DEXAMETHASONE SODIUM PHOSPHATE 10 MG/ML IJ SOLN
INTRAMUSCULAR | Status: AC
  Filled 2023-05-01: qty 1

## 2023-05-01 MED ORDER — HYDROCODONE-ACETAMINOPHEN 5-325 MG PO TABS: 2.0000 | ORAL_TABLET | Freq: Four times a day (QID) | ORAL | 0 refills | Status: DC | PRN

## 2023-05-01 MED ORDER — CEFAZOLIN SODIUM-DEXTROSE 2-4 GM/100ML-% IV SOLN
INTRAVENOUS | Status: AC
  Filled 2023-05-01: qty 100

## 2023-05-01 MED ORDER — PAPAVERINE HCL 30 MG/ML IJ SOLN
INTRAMUSCULAR | Status: AC
  Filled 2023-05-01: qty 2

## 2023-05-01 MED ORDER — PAPAVERINE HCL 30 MG/ML IJ SOLN
INTRAMUSCULAR | Status: DC | PRN
  Administered 2023-05-01: 60 mg via INTRAVENOUS

## 2023-05-01 MED ORDER — FENTANYL CITRATE (PF) 100 MCG/2ML IJ SOLN
INTRAMUSCULAR | Status: DC | PRN
  Administered 2023-05-01 (×4): 25 ug via INTRAVENOUS

## 2023-05-01 MED ORDER — OXYCODONE HCL 5 MG PO TABS: 5.0000 mg | ORAL_TABLET | Freq: Once | ORAL | Status: DC | PRN

## 2023-05-01 MED ORDER — ONDANSETRON HCL 4 MG/2ML IJ SOLN
INTRAMUSCULAR | Status: AC
  Filled 2023-05-01: qty 2

## 2023-05-01 MED ORDER — PROPOFOL 10 MG/ML IV BOLUS
INTRAVENOUS | Status: DC | PRN
  Administered 2023-05-01: 50 mg via INTRAVENOUS
  Administered 2023-05-01: 100 mg via INTRAVENOUS

## 2023-05-01 MED ORDER — ONDANSETRON HCL 4 MG/2ML IJ SOLN
INTRAMUSCULAR | Status: DC | PRN
  Administered 2023-05-01: 4 mg via INTRAVENOUS

## 2023-05-01 MED ORDER — MIDAZOLAM HCL 2 MG/2ML IJ SOLN
INTRAMUSCULAR | Status: AC
  Filled 2023-05-01: qty 2

## 2023-05-01 MED ORDER — FENTANYL CITRATE (PF) 100 MCG/2ML IJ SOLN: 25.0000 ug | INTRAMUSCULAR | Status: DC | PRN

## 2023-05-01 MED ORDER — HEPARIN SODIUM (PORCINE) 1000 UNIT/ML IJ SOLN
INTRAMUSCULAR | Status: DC | PRN
  Administered 2023-05-01: 3000 [IU] via INTRAVENOUS

## 2023-05-01 MED ORDER — FENTANYL CITRATE (PF) 100 MCG/2ML IJ SOLN
INTRAMUSCULAR | Status: AC
  Filled 2023-05-01: qty 2

## 2023-05-01 MED ORDER — LIDOCAINE HCL (CARDIAC) PF 100 MG/5ML IV SOSY
PREFILLED_SYRINGE | INTRAVENOUS | Status: DC | PRN
  Administered 2023-05-01: 40 mg via INTRAVENOUS

## 2023-05-01 MED ORDER — HEPARIN SODIUM (PORCINE) 5000 UNIT/ML IJ SOLN
INTRAMUSCULAR | Status: AC
  Filled 2023-05-01: qty 1

## 2023-05-01 MED ORDER — LIDOCAINE HCL (PF) 2 % IJ SOLN
INTRAMUSCULAR | Status: AC
  Filled 2023-05-01: qty 5

## 2023-05-01 MED ORDER — HEPARIN SODIUM (PORCINE) 1000 UNIT/ML IJ SOLN
INTRAMUSCULAR | Status: AC
  Filled 2023-05-01: qty 10

## 2023-05-01 MED ORDER — BUPIVACAINE HCL (PF) 0.5 % IJ SOLN
INTRAMUSCULAR | Status: AC
  Filled 2023-05-01: qty 30

## 2023-05-01 MED ORDER — PROPOFOL 10 MG/ML IV BOLUS
INTRAVENOUS | Status: AC
  Filled 2023-05-01: qty 20

## 2023-05-01 MED ORDER — SODIUM CHLORIDE 0.9 % IR SOLN
Status: DC | PRN
  Administered 2023-05-01: 500 mL

## 2023-05-01 SURGICAL SUPPLY — 43 items
ADH SKN CLS APL DERMABOND .7 (GAUZE/BANDAGES/DRESSINGS) ×1
APL PRP STRL LF DISP 70% ISPRP (MISCELLANEOUS) ×1
BAG DECANTER FOR FLEXI CONT (MISCELLANEOUS) ×1 IMPLANT
BLADE SURG SZ11 CARB STEEL (BLADE) ×1 IMPLANT
BOOT SUTURE VASCULAR YLW (MISCELLANEOUS) ×1
BRUSH SCRUB EZ 4% CHG (MISCELLANEOUS) ×1 IMPLANT
CHLORAPREP W/TINT 26 (MISCELLANEOUS) ×1 IMPLANT
CLAMP SUTURE YELLOW 5 PAIRS (MISCELLANEOUS) ×1 IMPLANT
DERMABOND ADVANCED .7 DNX12 (GAUZE/BANDAGES/DRESSINGS) ×1 IMPLANT
DRESSING SURGICEL FIBRLLR 1X2 (HEMOSTASIS) ×1 IMPLANT
DRSG SURGICEL FIBRILLAR 1X2 (HEMOSTASIS) ×1
ELECT CAUTERY BLADE 6.4 (BLADE) ×1 IMPLANT
ELECT REM PT RETURN 9FT ADLT (ELECTROSURGICAL) ×1
ELECTRODE REM PT RTRN 9FT ADLT (ELECTROSURGICAL) ×1 IMPLANT
GLOVE BIO SURGEON STRL SZ7 (GLOVE) ×2 IMPLANT
GOWN STRL REUS W/ TWL LRG LVL3 (GOWN DISPOSABLE) ×2 IMPLANT
GOWN STRL REUS W/ TWL XL LVL3 (GOWN DISPOSABLE) ×2 IMPLANT
GOWN STRL REUS W/TWL LRG LVL3 (GOWN DISPOSABLE) ×2
GOWN STRL REUS W/TWL XL LVL3 (GOWN DISPOSABLE) ×2
IV NS 500ML (IV SOLUTION) ×1
IV NS 500ML BAXH (IV SOLUTION) ×1 IMPLANT
IV SODIUM CHL 0.9% 500ML (IV SOLUTION) IMPLANT
KIT TURNOVER KIT A (KITS) ×1 IMPLANT
LABEL OR SOLS (LABEL) ×1 IMPLANT
LOOP VESSEL MAXI 1X406 RED (MISCELLANEOUS) ×1 IMPLANT
LOOP VESSEL MINI 0.8X406 BLUE (MISCELLANEOUS) ×1 IMPLANT
MANIFOLD NEPTUNE II (INSTRUMENTS) ×1 IMPLANT
PACK EXTREMITY ARMC (MISCELLANEOUS) ×1 IMPLANT
PAD PREP OB/GYN DISP 24X41 (PERSONAL CARE ITEMS) ×1 IMPLANT
STOCKINETTE 48X4 2 PLY STRL (GAUZE/BANDAGES/DRESSINGS) ×1 IMPLANT
STOCKINETTE STRL 4IN 9604848 (GAUZE/BANDAGES/DRESSINGS) ×1 IMPLANT
SUT MNCRL AB 4-0 PS2 18 (SUTURE) ×1 IMPLANT
SUT PROLENE 6 0 BV (SUTURE) ×4 IMPLANT
SUT SILK 2 0 (SUTURE) ×1
SUT SILK 2-0 18XBRD TIE 12 (SUTURE) ×1 IMPLANT
SUT SILK 3 0 (SUTURE) ×1
SUT SILK 3-0 18XBRD TIE 12 (SUTURE) ×1 IMPLANT
SUT VIC AB 3-0 SH 27 (SUTURE) ×1
SUT VIC AB 3-0 SH 27X BRD (SUTURE) ×1 IMPLANT
SYR 20ML LL LF (SYRINGE) ×1 IMPLANT
SYR 3ML LL SCALE MARK (SYRINGE) ×1 IMPLANT
TAG SUTURE CLAMP YLW 5PR (MISCELLANEOUS) ×1
TRAP FLUID SMOKE EVACUATOR (MISCELLANEOUS) ×1 IMPLANT

## 2023-05-01 NOTE — Anesthesia Postprocedure Evaluation (Signed)
Anesthesia Post Note  Patient: Tyrone Moore  Procedure(s) Performed: ARTERIOVENOUS (AV) FISTULA CREATION ( RADIOCEPHALIC) (Left)  Patient location during evaluation: PACU Anesthesia Type: General Level of consciousness: awake and alert Pain management: pain level controlled Vital Signs Assessment: post-procedure vital signs reviewed and stable Respiratory status: spontaneous breathing, nonlabored ventilation, respiratory function stable and patient connected to nasal cannula oxygen Cardiovascular status: blood pressure returned to baseline and stable Postop Assessment: no apparent nausea or vomiting Anesthetic complications: no  No notable events documented.   Last Vitals:  Vitals:   05/01/23 1315 05/01/23 1327  BP: (!) 142/80 (!) 128/96  Pulse: 70 68  Resp: 19 20  Temp: 36.4 C 37 C  SpO2: 97% 96%    Last Pain:  Vitals:   05/01/23 1327  TempSrc: Oral  PainSc:                  Stephanie Coup

## 2023-05-01 NOTE — Transfer of Care (Signed)
Immediate Anesthesia Transfer of Care Note  Patient: Tyrone Moore  Procedure(s) Performed: ARTERIOVENOUS (AV) FISTULA CREATION ( RADIOCEPHALIC) (Left)  Patient Location: PACU  Anesthesia Type:General  Level of Consciousness: drowsy  Airway & Oxygen Therapy: Patient Spontanous Breathing and Patient connected to face mask oxygen  Post-op Assessment: Report given to RN and Post -op Vital signs reviewed and stable  Post vital signs: Reviewed and stable  Last Vitals:  Vitals Value Taken Time  BP 133/61 05/01/23 1253  Temp    Pulse 65 05/01/23 1255  Resp 10 05/01/23 1255  SpO2 100 % 05/01/23 1255  Vitals shown include unvalidated device data.  Last Pain:  Vitals:   05/01/23 0847  PainSc: 0-No pain         Complications: No notable events documented.

## 2023-05-01 NOTE — Anesthesia Preprocedure Evaluation (Signed)
Anesthesia Evaluation  Patient identified by MRN, date of birth, ID band Patient awake    Reviewed: Allergy & Precautions, NPO status , Patient's Chart, lab work & pertinent test results  Airway Mallampati: IV  TM Distance: >3 FB Neck ROM: full    Dental  (+) Dental Advidsory Given, Edentulous Upper, Edentulous Lower   Pulmonary shortness of breath and with exertion, COPD, Current Smoker   Pulmonary exam normal        Cardiovascular hypertension, (-) angina (-) Past MI negative cardio ROS Normal cardiovascular exam     Neuro/Psych CVA, No Residual Symptoms  negative psych ROS   GI/Hepatic negative GI ROS, Neg liver ROS,,,  Endo/Other  Hypothyroidism    Renal/GU Renal disease  negative genitourinary   Musculoskeletal   Abdominal   Peds  Hematology negative hematology ROS (+)   Anesthesia Other Findings Past Medical History: 03/26/2021: Acquired hypothyroidism 08/15/2021: Anemia in chronic kidney disease 03/26/2021: BPH with obstruction/lower urinary tract symptoms 09/01/2021: Brainstem stroke (HCC) 03/26/2021: Chronic bilateral low back pain without sciatica     Comment:  S/p 4 lumbar spine surgeries No date: Chronic kidney disease (CKD), stage V (HCC) 07/11/2021: Chronic obstructive pulmonary disease, unspecified (HCC)     Comment:  no inhalers 08/31/2021: COVID-19 virus infection No date: Cyst of kidney, acquired 03/26/2021: Essential hypertension No date: GERD (gastroesophageal reflux disease) No date: Hyperlipidemia No date: Hyperparathyroidism (HCC) No date: Hypertension 03/26/2021: Mixed hyperlipidemia No date: Pre-diabetes No date: Proteinuria No date: Thyroid disease No date: Tobacco use disorder  Past Surgical History: 07/03/2021: CYSTOSCOPY W/ RETROGRADES; Bilateral     Comment:  Procedure: CYSTOSCOPY WITH RETROGRADE PYELOGRAM;                Surgeon: Vanna Scotland, MD;  Location: [facility];                 Service: Urology;  Laterality: Bilateral; 07/03/2021: CYSTOSCOPY WITH LITHOLAPAXY; N/A     Comment:  Procedure: CYSTOSCOPY WITH LITHOLAPAXY;  Surgeon:               Vanna Scotland, MD;  Location: [facility];  Service:               Urology;  Laterality: N/A; 07/03/2021: HOLEP-LASER ENUCLEATION OF THE PROSTATE WITH  MORCELLATION; N/A     Comment:  Procedure: HOLEP-LASER ENUCLEATION OF THE PROSTATE WITH               MORCELLATION;  Surgeon: Vanna Scotland, MD;  Location:               [facility];  Service: Urology;  Laterality: N/A; 2015: SPINE SURGERY     Comment:  X 4 surgery - lumbar disc  BMI    Body Mass Index: 27.27 kg/m      Reproductive/Obstetrics negative OB ROS                             Anesthesia Physical Anesthesia Plan  ASA: 3  Anesthesia Plan: General   Post-op Pain Management:    Induction: Intravenous  PONV Risk Score and Plan: Ondansetron, Dexamethasone and Treatment may vary due to age or medical condition  Airway Management Planned: LMA  Additional Equipment:   Intra-op Plan:   Post-operative Plan: Extubation in OR  Informed Consent: I have reviewed the patients History and Physical, chart, labs and discussed the procedure including the risks, benefits and alternatives for the proposed anesthesia with the  patient or authorized representative who has indicated his/her understanding and acceptance.     Dental Advisory Given  Plan Discussed with: Anesthesiologist, CRNA and Surgeon  Anesthesia Plan Comments: (Patient consented for risks of anesthesia including but not limited to:  - adverse reactions to medications - damage to eyes, teeth, lips or other oral mucosa - nerve damage due to positioning  - sore throat or hoarseness - Damage to heart, brain, nerves, lungs, other parts of body or loss of life  Patient voiced understanding.)       Anesthesia Quick Evaluation

## 2023-05-01 NOTE — Progress Notes (Signed)
Patient arrived to PostOp with bloody drainage from incision.  RN informed MD, applied pressure and dermabond as directed.

## 2023-05-01 NOTE — Discharge Instructions (Signed)
AMBULATORY SURGERY  ?DISCHARGE INSTRUCTIONS ? ? ?The drugs that you were given will stay in your system until tomorrow so for the next 24 hours you should not: ? ?Drive an automobile ?Make any legal decisions ?Drink any alcoholic beverage ? ? ?You may resume regular meals tomorrow.  Today it is better to start with liquids and gradually work up to solid foods. ? ?You may eat anything you prefer, but it is better to start with liquids, then soup and crackers, and gradually work up to solid foods. ? ? ?Please notify your doctor immediately if you have any unusual bleeding, trouble breathing, redness and pain at the surgery site, drainage, fever, or pain not relieved by medication. ? ? ? ?Additional Instructions: ? ? ? ?Please contact your physician with any problems or Same Day Surgery at [phone], Monday through Friday 6 am to 4 pm, or [place] [facility] [place] Main number at [phone].  ?

## 2023-05-01 NOTE — Anesthesia Procedure Notes (Signed)
Procedure Name: LMA Insertion Date/Time: 05/01/2023 11:46 AM  Performed by: Monico Hoar, CRNAPre-anesthesia Checklist: Patient identified, Emergency Drugs available, Suction available, Patient being monitored and Timeout performed Patient Re-evaluated:Patient Re-evaluated prior to induction Oxygen Delivery Method: Circle system utilized Preoxygenation: Pre-oxygenation with 100% oxygen Induction Type: IV induction LMA: LMA inserted LMA Size: 4.0 Number of attempts: 1 Placement Confirmation: positive ETCO2 and breath sounds checked- equal and bilateral Tube secured with: Tape

## 2023-05-01 NOTE — Interval H&P Note (Signed)
History and Physical Interval Note:  05/01/2023 10:44 AM  Tyrone Moore  has presented today for surgery, with the diagnosis of CHRONIC KIDNEY DISEASE STAGE 5.  The various methods of treatment have been discussed with the patient and family. After consideration of risks, benefits and other options for treatment, the patient has consented to  Procedure(s): ARTERIOVENOUS (AV) FISTULA CREATION ( RADIOCEPHALIC) (Left) as a surgical intervention.  The patient's history has been reviewed, patient examined, no change in status, stable for surgery.  I have reviewed the patient's chart and labs.  Questions were answered to the patient's satisfaction.     [name]

## 2023-05-01 NOTE — Op Note (Signed)
[facility]   OPERATIVE NOTE   PROCEDURE: Left radiocephaic arteriovenous fistula placement  PRE-OPERATIVE DIAGNOSIS: 1. CKD stage 4/5  POST-OPERATIVE DIAGNOSIS: Same  SURGEON: [name], MD  ASSISTANT(S): none  ANESTHESIA: general  ESTIMATED BLOOD LOSS: 10 cc  FINDING(S): none  SPECIMEN(S):  None  INDICATIONS:   Tyrone Moore is a 72 y.o. male who presents with advanced renal failure nearing dialysis dependence.  The patient is scheduled for left radiocephalic arteriovenous fistula placement when non-invasive studies suggested adequate anatomy for fistula creation at this location.  The patient is aware the risks include but are not limited to: bleeding, infection, steal syndrome, nerve damage, ischemic monomelic neuropathy, failure to mature, and need for additional procedures.  The patient is aware of the risks of the procedure and elects to proceed forward.  DESCRIPTION: After full informed written consent was obtained from the patient, the patient was brought back to the operating room and placed supine upon the operating table.  Prior to induction, the patient received IV antibiotics.   After obtaining adequate anesthesia, the patient was then prepped and draped in the standard fashion for a left arm access procedure.  I turned my attention first to identifying the patient's distal cephalic vein and radial artery.  I made an incision at the level of the distal forearm and wrist and dissected through the subcutaneous tissue and fascia to gain exposure of the radial artery.  This was noted to be useable for fistula creation.  This was dissected out proximally and distally and controlled with vessel loops.  I then dissected out the cephalic vein.  This was noted to be patent and adequate size for fistula creation. I then gave the patient 3000 units of intravenous heparin.  The distal segment of the vein was ligated with a  2-0 silk, and the vein was transected.   The proximal segment was interrogated with serial dilators.  The vein accepted up to a 3 mm dilator without any difficulty.  I then instilled the heparinized saline into the vein and clamped it.  At this point, I reset my exposure of the radial artery and placed the artery under tension proximally and distally.  I made an arteriotomy with a #11 blade, and then I extended the arteriotomy with a Potts scissor.  I injected heparinized saline proximal and distal to this arteriotomy.  The vein was then sewn to the artery in an end-to-side configuration with a running stitch of 6-0 Prolene.  Prior to completing this anastomosis, I allowed the vein and artery to backbleed.  There was no evidence of clot from any vessels.  I completed the anastomosis in the usual fashion and then released all vessel loops and clamps.  There was a palpable  thrill in the venous outflow, and there was a palpable radial pulse beyond the anastomosis.  At this point, I irrigated out the surgical wound. Surgicel was placed. There was no further active bleeding.  The subcutaneous tissue was reapproximated with a running stitch of 3-0 Vicryl.  The skin was then reapproximated with a running subcuticular stitch of 4-0 Monocryl.  The skin was then cleaned, dried, and reinforced with Dermabond.  The patient tolerated this procedure well and was taken to the recovery room in stable condition  COMPLICATIONS: None  CONDITION: Stable   [name], MD 05/01/2023 1:01 PM   This note was created with Dragon Medical transcription system. Any errors in dictation are purely unintentional.

## 2023-05-02 ENCOUNTER — Encounter: Payer: Self-pay | Admitting: Vascular Surgery

## 2023-06-02 ENCOUNTER — Telehealth: Payer: Self-pay | Admitting: Internal Medicine

## 2023-06-02 NOTE — Telephone Encounter (Signed)
routed

## 2023-06-04 DIAGNOSIS — N185 Chronic kidney disease, stage 5: Secondary | ICD-10-CM | POA: Diagnosis not present

## 2023-06-04 DIAGNOSIS — D631 Anemia in chronic kidney disease: Secondary | ICD-10-CM | POA: Diagnosis not present

## 2023-06-04 DIAGNOSIS — I10 Essential (primary) hypertension: Secondary | ICD-10-CM | POA: Diagnosis not present

## 2023-06-04 DIAGNOSIS — R809 Proteinuria, unspecified: Secondary | ICD-10-CM | POA: Diagnosis not present

## 2023-06-11 ENCOUNTER — Telehealth: Payer: Self-pay

## 2023-06-11 NOTE — Telephone Encounter (Signed)
Spoke to Tyrone Moore concerning power chair. Pt is needing to be reimbursed for power chair. Per Dr. Judithann Graves- drop paperwork off and she will look at it and fill out next week

## 2023-06-12 ENCOUNTER — Other Ambulatory Visit (INDEPENDENT_AMBULATORY_CARE_PROVIDER_SITE_OTHER): Payer: Self-pay | Admitting: Vascular Surgery

## 2023-06-12 DIAGNOSIS — N186 End stage renal disease: Secondary | ICD-10-CM

## 2023-06-12 DIAGNOSIS — Z9889 Other specified postprocedural states: Secondary | ICD-10-CM

## 2023-06-13 ENCOUNTER — Encounter (INDEPENDENT_AMBULATORY_CARE_PROVIDER_SITE_OTHER): Payer: Self-pay | Admitting: Nurse Practitioner

## 2023-06-13 ENCOUNTER — Ambulatory Visit (INDEPENDENT_AMBULATORY_CARE_PROVIDER_SITE_OTHER): Payer: Medicare HMO

## 2023-06-13 ENCOUNTER — Ambulatory Visit (INDEPENDENT_AMBULATORY_CARE_PROVIDER_SITE_OTHER): Payer: Medicare HMO | Admitting: Vascular Surgery

## 2023-06-13 VITALS — BP 170/78 | HR 65 | Resp 18 | Ht 71.0 in | Wt 199.4 lb

## 2023-06-13 DIAGNOSIS — R7303 Prediabetes: Secondary | ICD-10-CM

## 2023-06-13 DIAGNOSIS — Z9889 Other specified postprocedural states: Secondary | ICD-10-CM | POA: Diagnosis not present

## 2023-06-13 DIAGNOSIS — N186 End stage renal disease: Secondary | ICD-10-CM

## 2023-06-13 DIAGNOSIS — I10 Essential (primary) hypertension: Secondary | ICD-10-CM

## 2023-06-13 DIAGNOSIS — N185 Chronic kidney disease, stage 5: Secondary | ICD-10-CM

## 2023-06-13 NOTE — Progress Notes (Signed)
Patient ID: Tyrone Moore, male   DOB: November 16, 1950, 72 y.o.   MRN: [id number]  Chief Complaint  Patient presents with   Follow-up    Follow up - 5 weeks (06/05/2023); with HDA    HPI Tyrone Moore is a 72 y.o. male.  Patient returns about 5 weeks after left radiocephalic AV fistula creation.  He still has a little numbness on his thumb and index finger and some pain just proximal to the incision site.  He started using the hand and arm more in the last week or so.  Duplex today shows a patent left radiocephalic AV fistula with only mildly elevated velocities in the perianastomotic region.   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   Chronic bilateral low back pain without sciatica 03/26/2021   S/p 4 lumbar spine surgeries   Chronic kidney disease (CKD), stage V (HCC)    Chronic obstructive pulmonary disease, unspecified (HCC) 07/11/2021   no inhalers   COVID-19 virus infection 08/31/2021   Cyst of kidney, acquired    Essential hypertension 03/26/2021   GERD (gastroesophageal reflux disease)    Hyperlipidemia    Hyperparathyroidism (HCC)    Hypertension    Mixed hyperlipidemia 03/26/2021   Pre-diabetes    Proteinuria    Thyroid disease    Tobacco use disorder     Past Surgical History:  Procedure Laterality Date   AV FISTULA PLACEMENT Left 05/01/2023   Procedure: ARTERIOVENOUS (AV) FISTULA CREATION ( RADIOCEPHALIC);  Surgeon: Annice Needy, MD;  Location: [facility];  Service: Vascular;  Laterality: Left;   CYSTOSCOPY W/ RETROGRADES Bilateral 07/03/2021   Procedure: CYSTOSCOPY WITH RETROGRADE PYELOGRAM;  Surgeon: Vanna Scotland, MD;  Location: [facility];  Service: Urology;  Laterality: Bilateral;   CYSTOSCOPY WITH LITHOLAPAXY N/A 07/03/2021   Procedure: CYSTOSCOPY WITH LITHOLAPAXY;  Surgeon: Vanna Scotland, MD;  Location: [facility];  Service: Urology;  Laterality: N/A;   HOLEP-LASER ENUCLEATION OF THE PROSTATE WITH MORCELLATION N/A 07/03/2021   Procedure: HOLEP-LASER ENUCLEATION OF THE PROSTATE WITH MORCELLATION;  Surgeon: Vanna Scotland, MD;  Location: [facility];  Service: Urology;  Laterality: N/A;   SPINE SURGERY  2015   X 4 surgery - lumbar disc      No Known Allergies  Current Outpatient Medications  Medication Sig Dispense Refill   acetaminophen (TYLENOL) 500 MG tablet Take 1,000 mg by mouth every 6 (six) hours as needed for moderate pain.     amLODipine (NORVASC) 2.5 MG tablet Take 2.5 mg by mouth at bedtime.     aspirin EC 81 MG EC tablet Take 1 tablet (81 mg total) by mouth daily. Swallow whole. (Patient taking differently: Take 81 mg by mouth at bedtime. Swallow whole.) 30 tablet 11   atorvastatin (LIPITOR) 40 MG tablet Take 40 mg by mouth daily.     calcitRIOL (ROCALTROL) 0.25 MCG capsule Take 0.25 mcg by mouth at bedtime.     carvedilol (COREG) 25 MG tablet Take 1 tablet (25 mg total) by mouth 2 (two) times daily with a meal. 200 tablet 1   Cholecalciferol (D3-1000 PO) Take 2,000 Units by mouth daily.     ferrous sulfate 325 (65 FE) MG tablet Take 325 mg by mouth.     HYDROcodone-acetaminophen (NORCO/VICODIN) 5-325 MG tablet Take 2 tablets by mouth every 6 (six) hours as needed  for moderate pain. 20 tablet 0   levothyroxine (EUTHYROX) 112 MCG tablet Take 1 tablet (112 mcg total) by mouth daily before breakfast. 100 tablet 1   Multiple Vitamins-Minerals (MULTIVITAL-M PO) Take 1 tablet by mouth at bedtime.     omeprazole (PRILOSEC) 20 MG capsule Take 1 capsule (20 mg total) by mouth daily. (Patient taking differently: Take 20 mg by mouth at bedtime.) 100 capsule 1   rosuvastatin (CRESTOR) 5 MG tablet Take 1 tablet by mouth once daily (Patient taking differently: Take 5 mg by mouth at bedtime.) 100 tablet 2   No current facility-administered medications for this visit.         Physical Exam BP (!) 170/78 (BP Location: Right Arm)   Pulse 65   Resp 18   Ht 5\' 11"  (1.803 m)   Wt 199 lb 6.4 oz (90.4 kg)   BMI 27.81 kg/m  Gen:  WD/WN, NAD Skin: incision C/D/I.  Excellent thrill is present in the left radiocephalic AV fistula     Assessment/Plan:  Chronic kidney disease (CKD), stage V (HCC) Duplex today shows a patent left radiocephalic AV fistula with only mildly elevated velocities in the perianastomotic region.  He is now about 5 weeks after fistula placement.  He is scheduled to see his nephrologist later this month and his son expects them to start dialysis at that time.  The fistula should be usable for dialysis after he sees his nephrologist.  I will plan to see him back in 3 to 4 months.  Prediabetes blood glucose control important in reducing the progression of atherosclerotic disease. Also, involved in wound healing. On appropriate medications.   Essential hypertension blood pressure control important in reducing the progression of atherosclerotic disease. On appropriate oral medications.      [name] 06/13/2023, 10:34 AM   This note was created with Dragon medical transcription system.  Any errors from dictation are unintentional.

## 2023-06-13 NOTE — Assessment & Plan Note (Signed)
blood pressure control important in reducing the progression of atherosclerotic disease. On appropriate oral medications.  

## 2023-06-13 NOTE — Assessment & Plan Note (Signed)
blood glucose control important in reducing the progression of atherosclerotic disease. Also, involved in wound healing. On appropriate medications.  

## 2023-06-13 NOTE — Assessment & Plan Note (Signed)
Duplex today shows a patent left radiocephalic AV fistula with only mildly elevated velocities in the perianastomotic region.  He is now about 5 weeks after fistula placement.  He is scheduled to see his nephrologist later this month and his son expects them to start dialysis at that time.  The fistula should be usable for dialysis after he sees his nephrologist.  I will plan to see him back in 3 to 4 months.

## 2023-07-03 ENCOUNTER — Other Ambulatory Visit: Payer: Self-pay

## 2023-07-03 DIAGNOSIS — E782 Mixed hyperlipidemia: Secondary | ICD-10-CM

## 2023-07-03 MED ORDER — ROSUVASTATIN CALCIUM 5 MG PO TABS
5.0000 mg | ORAL_TABLET | Freq: Every day | ORAL | 0 refills | Status: DC
Start: 2023-07-03 — End: 2023-10-01

## 2023-07-08 DIAGNOSIS — N185 Chronic kidney disease, stage 5: Secondary | ICD-10-CM | POA: Diagnosis not present

## 2023-07-08 DIAGNOSIS — R809 Proteinuria, unspecified: Secondary | ICD-10-CM | POA: Diagnosis not present

## 2023-07-08 DIAGNOSIS — D631 Anemia in chronic kidney disease: Secondary | ICD-10-CM | POA: Diagnosis not present

## 2023-07-08 DIAGNOSIS — I10 Essential (primary) hypertension: Secondary | ICD-10-CM | POA: Diagnosis not present

## 2023-07-10 ENCOUNTER — Ambulatory Visit
Admission: RE | Admit: 2023-07-10 | Discharge: 2023-07-10 | Disposition: A | Discharge status: Home / Self Care | Payer: Medicare HMO | Source: Ambulatory Visit | Attending: Nephrology | Admitting: Nephrology

## 2023-07-10 ENCOUNTER — Other Ambulatory Visit: Payer: Self-pay | Admitting: Nephrology

## 2023-07-10 ENCOUNTER — Ambulatory Visit
Admission: RE | Admit: 2023-07-10 | Discharge: 2023-07-10 | Disposition: A | Discharge status: Home / Self Care | Payer: Medicare HMO | Attending: Nephrology | Admitting: Nephrology

## 2023-07-10 DIAGNOSIS — A150 Tuberculosis of lung: Secondary | ICD-10-CM | POA: Insufficient documentation

## 2023-07-10 DIAGNOSIS — A159 Respiratory tuberculosis unspecified: Secondary | ICD-10-CM | POA: Diagnosis not present

## 2023-07-10 DIAGNOSIS — N289 Disorder of kidney and ureter, unspecified: Secondary | ICD-10-CM | POA: Diagnosis not present

## 2023-07-16 DIAGNOSIS — N186 End stage renal disease: Secondary | ICD-10-CM | POA: Diagnosis not present

## 2023-07-16 DIAGNOSIS — Z992 Dependence on renal dialysis: Secondary | ICD-10-CM | POA: Diagnosis not present

## 2023-07-18 DIAGNOSIS — Z992 Dependence on renal dialysis: Secondary | ICD-10-CM | POA: Diagnosis not present

## 2023-07-18 DIAGNOSIS — N186 End stage renal disease: Secondary | ICD-10-CM | POA: Diagnosis not present

## 2023-07-21 DIAGNOSIS — Z13220 Encounter for screening for lipoid disorders: Secondary | ICD-10-CM | POA: Diagnosis not present

## 2023-07-21 DIAGNOSIS — N186 End stage renal disease: Secondary | ICD-10-CM | POA: Diagnosis not present

## 2023-07-21 DIAGNOSIS — Z1159 Encounter for screening for other viral diseases: Secondary | ICD-10-CM | POA: Diagnosis not present

## 2023-07-21 DIAGNOSIS — Z992 Dependence on renal dialysis: Secondary | ICD-10-CM | POA: Diagnosis not present

## 2023-07-23 DIAGNOSIS — Z992 Dependence on renal dialysis: Secondary | ICD-10-CM | POA: Diagnosis not present

## 2023-07-23 DIAGNOSIS — N186 End stage renal disease: Secondary | ICD-10-CM | POA: Diagnosis not present

## 2023-07-25 DIAGNOSIS — N186 End stage renal disease: Secondary | ICD-10-CM | POA: Diagnosis not present

## 2023-07-25 DIAGNOSIS — Z992 Dependence on renal dialysis: Secondary | ICD-10-CM | POA: Diagnosis not present

## 2023-07-28 DIAGNOSIS — Z992 Dependence on renal dialysis: Secondary | ICD-10-CM | POA: Diagnosis not present

## 2023-07-28 DIAGNOSIS — N186 End stage renal disease: Secondary | ICD-10-CM | POA: Diagnosis not present

## 2023-08-11 DIAGNOSIS — N186 End stage renal disease: Secondary | ICD-10-CM | POA: Diagnosis not present

## 2023-08-11 DIAGNOSIS — Z992 Dependence on renal dialysis: Secondary | ICD-10-CM | POA: Diagnosis not present

## 2023-08-12 ENCOUNTER — Other Ambulatory Visit (INDEPENDENT_AMBULATORY_CARE_PROVIDER_SITE_OTHER): Payer: Self-pay | Admitting: Nurse Practitioner

## 2023-08-12 DIAGNOSIS — N186 End stage renal disease: Secondary | ICD-10-CM

## 2023-08-12 DIAGNOSIS — M7989 Other specified soft tissue disorders: Secondary | ICD-10-CM

## 2023-08-14 ENCOUNTER — Ambulatory Visit (INDEPENDENT_AMBULATORY_CARE_PROVIDER_SITE_OTHER): Payer: Medicare HMO

## 2023-08-14 DIAGNOSIS — M7989 Other specified soft tissue disorders: Secondary | ICD-10-CM | POA: Diagnosis not present

## 2023-08-14 DIAGNOSIS — N186 End stage renal disease: Secondary | ICD-10-CM

## 2023-08-27 ENCOUNTER — Ambulatory Visit: Payer: Medicare HMO

## 2023-08-27 VITALS — BP 126/66 | Ht 71.0 in | Wt 199.4 lb

## 2023-08-27 DIAGNOSIS — Z0000 Encounter for general adult medical examination without abnormal findings: Secondary | ICD-10-CM

## 2023-08-27 NOTE — Progress Notes (Signed)
Subjective:   Tyrone Moore is a 72 y.o. male who presents for Medicare Annual/Subsequent preventive examination.  Visit Complete: In person  Cardiac Risk Factors include: advanced age (>80men, >62 women);male gender;dyslipidemia;hypertension;sedentary lifestyle;smoking/ tobacco exposure     Objective:    Today's Vitals   08/27/23 0935  BP: 126/66  Weight: 199 lb 6.4 oz (90.4 kg)  Height: 5\' 11"  (1.803 m)   Body mass index is 27.81 kg/m.     08/27/2023    9:43 AM 05/01/2023    8:48 AM 04/25/2023    1:26 PM 08/21/2022   10:14 AM 08/31/2021    9:40 AM 07/18/2021    1:38 PM 07/08/2021   10:04 AM  Advanced Directives  Does Patient Have a Medical Advance Directive? No No No Yes No Yes No  Type of Advance Directive    Living will;Healthcare Power of [employer] Power of [place];Living will   Copy of Healthcare Power of Attorney in Chart?    No - copy requested  No - copy requested   Would patient like information on creating a medical advance directive? No - Patient declined No - Patient declined   No - Patient declined      Current Medications (verified) Outpatient Encounter Medications as of 08/27/2023  Medication Sig   acetaminophen (TYLENOL) 500 MG tablet Take 1,000 mg by mouth every 6 (six) hours as needed for moderate pain.   amLODipine (NORVASC) 2.5 MG tablet Take 2.5 mg by mouth at bedtime.   aspirin EC 81 MG EC tablet Take 1 tablet (81 mg total) by mouth daily. Swallow whole. (Patient taking differently: Take 81 mg by mouth at bedtime. Swallow whole.)   calcitRIOL (ROCALTROL) 0.25 MCG capsule Take 0.25 mcg by mouth at bedtime.   carvedilol (COREG) 25 MG tablet Take 1 tablet (25 mg total) by mouth 2 (two) times daily with a meal.   Cholecalciferol (D3-1000 PO) Take 2,000 Units by mouth daily.   ferrous sulfate 325 (65 FE) MG tablet Take 325 mg by mouth.   levothyroxine (EUTHYROX) 112 MCG tablet Take 1 tablet (112 mcg total) by mouth daily before breakfast.    Multiple Vitamins-Minerals (MULTIVITAL-M PO) Take 1 tablet by mouth at bedtime.   omeprazole (PRILOSEC) 20 MG capsule Take 1 capsule (20 mg total) by mouth daily. (Patient taking differently: Take 20 mg by mouth at bedtime.)   rosuvastatin (CRESTOR) 5 MG tablet Take 1 tablet (5 mg total) by mouth daily.   HYDROcodone-acetaminophen (NORCO/VICODIN) 5-325 MG tablet Take 2 tablets by mouth every 6 (six) hours as needed for moderate pain. (Patient not taking: Reported on 08/27/2023)   No facility-administered encounter medications on file as of 08/27/2023.    Allergies (verified) Patient has no known allergies.   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   Chronic bilateral low back pain without sciatica 03/26/2021   S/p 4 lumbar spine surgeries   Chronic kidney disease (CKD), stage V (HCC)    Chronic obstructive pulmonary disease, unspecified (HCC) 07/11/2021   no inhalers   COVID-19 virus infection 08/31/2021   Cyst of kidney, acquired    Essential hypertension 03/26/2021   GERD (gastroesophageal reflux disease)    Hyperlipidemia    Hyperparathyroidism (HCC)    Hypertension    Mixed hyperlipidemia 03/26/2021   Pre-diabetes    Proteinuria  Thyroid disease    Tobacco use disorder    Past Surgical History:  Procedure Laterality Date   AV FISTULA PLACEMENT Left 05/01/2023   Procedure: ARTERIOVENOUS (AV) FISTULA CREATION ( RADIOCEPHALIC);  Surgeon: Annice Needy, MD;  Location: [facility] OR[facility];  Service: Vascular;  Laterality: Left;   CYSTOSCOPY W/ RETROGRADES Bilateral 07/03/2021   Procedure: CYSTOSCOPY WITH RETROGRADE PYELOGRAM;  Surgeon: Vanna Scotland, MD;  Location: [facility] OR[facility];  Service: Urology;  Laterality: Bilateral;   CYSTOSCOPY WITH LITHOLAPAXY N/A 07/03/2021   Procedure: CYSTOSCOPY WITH  LITHOLAPAXY;  Surgeon: Vanna Scotland, MD;  Location: [facility];  Service: Urology;  Laterality: N/A;   HOLEP-LASER ENUCLEATION OF THE PROSTATE WITH MORCELLATION N/A 07/03/2021   Procedure: HOLEP-LASER ENUCLEATION OF THE PROSTATE WITH MORCELLATION;  Surgeon: Vanna Scotland, MD;  Location: [facility];  Service: Urology;  Laterality: N/A;   SPINE SURGERY  2015   X 4 surgery - lumbar disc   Family History  Problem Relation Age of Onset   Hypertension Mother    Heart disease Mother    Heart disease Sister    Hypertension Sister    Prostate cancer Neg Hx    Bladder Cancer Neg Hx    Kidney cancer Neg Hx    Social History   Socioeconomic History   Marital status: Widowed    Spouse name: Not on file   Number of children: Not on file   Years of education: Not on file   Highest education level: Not on file  Occupational History   Not on file  Tobacco Use   Smoking status: Heavy Smoker    Current packs/day: 1.00    Average packs/day: 1 pack/day for 54.0 years (54.0 ttl pk-yrs)    Types: Cigarettes    Passive exposure: Current   Smokeless tobacco: Never  Vaping Use   Vaping status: Never Used  Substance and Sexual Activity   Alcohol use: Not Currently    Comment: quit drinking at 19   Drug use: Never   Sexual activity: Not Currently    Partners: Female    Birth control/protection: None  Other Topics Concern   Not on file  Social History Narrative   Pt lives with son and his family.   Social Determinants of Health   Financial Resource Strain: Low Risk  (08/27/2023)   Overall Financial Resource Strain (CARDIA)    Difficulty of Paying Living Expenses: Not hard at all  Food Insecurity: No Food Insecurity (08/27/2023)   Hunger Vital Sign    Worried About Running Out of Food in the Last Year: Never true    Ran Out of Food in the Last Year: Never true  Transportation Needs: No Transportation Needs (08/27/2023)   PRAPARE - Administrator, Civil Service (Medical): No     Lack of Transportation (Non-Medical): No  Physical Activity: Inactive (08/27/2023)   Exercise Vital Sign    Days of Exercise per Week: 0 days    Minutes of Exercise per Session: 0 min  Stress: No Stress Concern Present (08/27/2023)   [employer] of Occupational Health - Occupational Stress Questionnaire    Feeling of Stress : Not at all  Social Connections: Socially Isolated (08/27/2023)   Social Connection and Isolation Panel [NHANES]    Frequency of Communication with Friends and Family: More than three times a week    Frequency of Social Gatherings with Friends and Family: Three times a week    Attends Religious Services: Never    Active Member of Clubs  or Organizations: No    Attends Banker Meetings: Never    Marital Status: Widowed    Tobacco Counseling Ready to quit: Not Answered Counseling given: Not Answered   Clinical Intake:  Pre-visit preparation completed: Yes  Pain : No/denies pain     Nutritional Status: BMI 25 -29 Overweight Nutritional Risks: None Diabetes: No  How often do you need to have someone help you when you read instructions, pamphlets, or other written materials from your doctor or pharmacy?: 1 - Never  Interpreter Needed?: No  Information entered by :: Kennedy Bucker, LPN   Activities of Daily Living    08/27/2023    9:44 AM 04/25/2023    1:41 PM  In your present state of health, do you have any difficulty performing the following activities:  Hearing? 0 0  Vision? 0 0  Difficulty concentrating or making decisions? 0 0  Walking or climbing stairs? 1 0  Comment hip pain   Dressing or bathing? 0 0  Doing errands, shopping? 0 0  Preparing Food and eating ? N   Using the Toilet? N   In the past six months, have you accidently leaked urine? N   Do you have problems with loss of bowel control? N   Managing your Medications? N   Managing your Finances? N   Housekeeping or managing your Housekeeping? N     Patient  Care Team: [name], MD as PCP - General (Internal Medicine) Vanna Scotland, MD as Consulting Physician (Urology) Lorain Childes, MD as Consulting Physician (Nephrology) [facility], [facility])  Indicate any recent Medical Services you may have received from other than [facility] providers in the past year (date may be approximate).     Assessment:   This is a routine wellness examination for Babak.  Hearing/Vision screen Hearing Screening - Comments:: No aids Vision Screening - Comments:: [facility]   Goals Addressed             This Visit's Progress    DIET - EAT MORE FRUITS AND VEGETABLES         Depression Screen    08/27/2023    9:41 AM 03/19/2023    8:06 AM 09/17/2022    1:19 PM 08/21/2022   10:15 AM 06/27/2022   10:04 AM 03/11/2022    1:19 PM 12/27/2021   10:13 AM  PHQ 2/9 Scores  PHQ - 2 Score 0 0 0 2 0 0 0  PHQ- 9 Score 0 2 0 2 0 3 2    Fall Risk    08/27/2023    9:43 AM 03/19/2023    8:06 AM 09/17/2022    1:19 PM 08/21/2022   10:15 AM 06/27/2022   10:05 AM  Fall Risk   Falls in the past year? 0 0 0 0 0  Number falls in past yr: 0 0 0 0 0  Injury with Fall? 0 0 0 0 0  Risk for fall due to : No Fall Risks No Fall Risks No Fall Risks No Fall Risks No Fall Risks  Follow up Falls prevention discussed;Falls evaluation completed Falls evaluation completed Falls evaluation completed Falls evaluation completed Falls evaluation completed    MEDICARE RISK AT HOME: Medicare Risk at Home Any stairs in or around the home?: Yes If so, are there any without handrails?: No Home free of loose throw rugs in walkways, pet beds, electrical cords, etc?: Yes Adequate lighting in your home to reduce risk of falls?: Yes Life  alert?: No Use of a cane, walker or w/c?: No Grab bars in the bathroom?: Yes Shower chair or bench in shower?: Yes Elevated toilet seat or a handicapped toilet?: Yes  TIMED UP AND GO:  Was the test performed?  Yes  Length of  time to ambulate 10 feet: 4 sec Gait steady and fast without use of assistive device    Cognitive Function:        08/27/2023    9:45 AM 08/21/2022   10:16 AM  6CIT Screen  What Year? 0 points 0 points  What month? 0 points 0 points  What time? 0 points 0 points  Count back from 20 0 points 0 points  Months in reverse 4 points 0 points  Repeat phrase 0 points 2 points  Total Score 4 points 2 points    Immunizations  There is no immunization history on file for this patient.  TDAP status: Due, Education has been provided regarding the importance of this vaccine. Advised may receive this vaccine at local pharmacy or Health Dept. Aware to provide a copy of the vaccination record if obtained from local pharmacy or Health Dept. Verbalized acceptance and understanding.  Flu Vaccine status: Declined, Education has been provided regarding the importance of this vaccine but patient still declined. Advised may receive this vaccine at local pharmacy or Health Dept. Aware to provide a copy of the vaccination record if obtained from local pharmacy or Health Dept. Verbalized acceptance and understanding.  Pneumococcal vaccine status: Declined,  Education has been provided regarding the importance of this vaccine but patient still declined. Advised may receive this vaccine at local pharmacy or Health Dept. Aware to provide a copy of the vaccination record if obtained from local pharmacy or Health Dept. Verbalized acceptance and understanding.   Covid-19 vaccine status: Declined, Education has been provided regarding the importance of this vaccine but patient still declined. Advised may receive this vaccine at local pharmacy or Health Dept.or vaccine clinic. Aware to provide a copy of the vaccination record if obtained from local pharmacy or Health Dept. Verbalized acceptance and understanding.  Qualifies for Shingles Vaccine? Yes   Zostavax completed No   Shingrix Completed?: No.    Education has  been provided regarding the importance of this vaccine. Patient has been advised to call insurance company to determine out of pocket expense if they have not yet received this vaccine. Advised may also receive vaccine at local pharmacy or Health Dept. Verbalized acceptance and understanding.  Screening Tests Health Maintenance  Topic Date Due   Colonoscopy  Never done   Zoster Vaccines- Shingrix (1 of 2) Never done   COVID-19 Vaccine (1 - 2023-24 season) Never done   INFLUENZA VACCINE  02/09/2024 (Originally 06/12/2023)   Pneumonia Vaccine 90+ Years old (1 of 2 - PCV) 03/18/2024 (Originally 09/11/1957)   Medicare Annual Wellness (AWV)  08/26/2024   Hepatitis C Screening  Completed   HPV VACCINES  Aged Out   Lung Cancer Screening  Discontinued   DTaP/Tdap/Td  Discontinued    Health Maintenance  Health Maintenance Due  Topic Date Due   Colonoscopy  Never done   Zoster Vaccines- Shingrix (1 of 2) Never done   COVID-19 Vaccine (1 - 2023-24 season) Never done    Declined referral for colonoscopy  Lung Cancer Screening: (Low Dose CT Chest recommended if Age 2-80 years, 20 pack-year currently smoking OR have quit w/in 15years.) does qualify.   Lung Cancer Screening Referral: declined referral  Additional Screening:  Hepatitis C Screening: does qualify; Completed 03/19/23  Vision Screening: Recommended annual ophthalmology exams for early detection of glaucoma and other disorders of the eye. Is the patient up to date with their annual eye exam?  Yes  Who is the provider or what is the name of the office in which the patient attends annual eye exams? [facility] If pt is not established with a provider, would they like to be referred to a provider to establish care? No .   Dental Screening: Recommended annual dental exams for proper oral hygiene   Community Resource Referral / Chronic Care Management: CRR required this visit?  No   CCM required this visit?  No     Plan:      I have personally reviewed and noted the following in the patient's chart:   Medical and social history Use of alcohol, tobacco or illicit drugs  Current medications and supplements including opioid prescriptions. Patient is not currently taking opioid prescriptions. Functional ability and status Nutritional status Physical activity Advanced directives List of other physicians Hospitalizations, surgeries, and ER visits in previous 12 months Vitals Screenings to include cognitive, depression, and falls Referrals and appointments  In addition, I have reviewed and discussed with patient certain preventive protocols, quality metrics, and best practice recommendations. A written personalized care plan for preventive services as well as general preventive health recommendations were provided to patient.     Hal Hope, LPN   78/29/5621   After Visit Summary: my chart  Nurse Notes: none- declined all shots

## 2023-08-27 NOTE — Patient Instructions (Addendum)
Tyrone Moore , Thank you for taking time to come for your Medicare Wellness Visit. I appreciate your ongoing commitment to your health goals. Please review the following plan we discussed and let me know if I can assist you in the future.   Referrals/Orders/Follow-Ups/Clinician Recommendations: none- declined all shots  This is a list of the screening recommended for you and due dates:  Health Maintenance  Topic Date Due   Colon Cancer Screening  Never done   Zoster (Shingles) Vaccine (1 of 2) Never done   COVID-19 Vaccine (1 - 2023-24 season) Never done   Flu Shot  02/09/2024*   Pneumonia Vaccine (1 of 2 - PCV) 03/18/2024*   Medicare Annual Wellness Visit  08/26/2024   Hepatitis C Screening  Completed   HPV Vaccine  Aged Out   Screening for Lung Cancer  Discontinued   DTaP/Tdap/Td vaccine  Discontinued  *Topic was postponed. The date shown is not the original due date.    Advanced directives: (ACP Link)Information on Advanced Care Planning can be found at [facility] Directives Advance Health Care Directives ([email or web address]   Next Medicare Annual Wellness Visit scheduled for next year: Yes   09/01/24 @ 8:55 am in person

## 2023-09-09 ENCOUNTER — Other Ambulatory Visit: Payer: Self-pay | Admitting: Internal Medicine

## 2023-09-09 DIAGNOSIS — K219 Gastro-esophageal reflux disease without esophagitis: Secondary | ICD-10-CM

## 2023-09-16 ENCOUNTER — Encounter (INDEPENDENT_AMBULATORY_CARE_PROVIDER_SITE_OTHER): Payer: Medicare HMO

## 2023-09-16 ENCOUNTER — Ambulatory Visit (INDEPENDENT_AMBULATORY_CARE_PROVIDER_SITE_OTHER): Payer: Medicare HMO | Admitting: Vascular Surgery

## 2023-09-16 ENCOUNTER — Encounter (INDEPENDENT_AMBULATORY_CARE_PROVIDER_SITE_OTHER): Payer: Self-pay | Admitting: Vascular Surgery

## 2023-09-16 VITALS — BP 133/63 | HR 74 | Resp 16 | Wt 197.4 lb

## 2023-09-16 DIAGNOSIS — E782 Mixed hyperlipidemia: Secondary | ICD-10-CM | POA: Diagnosis not present

## 2023-09-16 DIAGNOSIS — Z992 Dependence on renal dialysis: Secondary | ICD-10-CM | POA: Diagnosis not present

## 2023-09-16 DIAGNOSIS — N186 End stage renal disease: Secondary | ICD-10-CM | POA: Diagnosis not present

## 2023-09-16 DIAGNOSIS — I10 Essential (primary) hypertension: Secondary | ICD-10-CM

## 2023-09-16 NOTE — Progress Notes (Signed)
MRN : [id number]  Tyrone Moore is a 72 y.o. (03/04/1951) male who presents with chief complaint of  Chief Complaint  Patient presents with   Follow-up    3 month HDA  .  History of Present Illness: Patient returns today in follow up of his dialysis access.  He used this initially the first week and it worked fine.  The second week he had an infiltration but what sounds like an experience dialysis technologist.  He has not had dialysis since then.  He had a duplex last month that shows stable mildly elevated velocities in the distal forearm cephalic vein portion of the fistula but otherwise a good patent fistula.  Current Outpatient Medications  Medication Sig Dispense Refill   acetaminophen (TYLENOL) 500 MG tablet Take 1,000 mg by mouth every 6 (six) hours as needed for moderate pain.     amLODipine (NORVASC) 2.5 MG tablet Take 2.5 mg by mouth at bedtime.     aspirin EC 81 MG EC tablet Take 1 tablet (81 mg total) by mouth daily. Swallow whole. (Patient taking differently: Take 81 mg by mouth at bedtime. Swallow whole.) 30 tablet 11   calcitRIOL (ROCALTROL) 0.25 MCG capsule Take 0.25 mcg by mouth at bedtime.     carvedilol (COREG) 25 MG tablet Take 1 tablet (25 mg total) by mouth 2 (two) times daily with a meal. 200 tablet 1   Cholecalciferol (D3-1000 PO) Take 2,000 Units by mouth daily.     ferrous sulfate 325 (65 FE) MG tablet Take 325 mg by mouth.     levothyroxine (EUTHYROX) 112 MCG tablet Take 1 tablet (112 mcg total) by mouth daily before breakfast. 100 tablet 1   lidocaine-prilocaine (EMLA) cream Apply 1 Application topically as directed.     Multiple Vitamins-Minerals (MULTIVITAL-M PO) Take 1 tablet by mouth at bedtime.     omeprazole (PRILOSEC) 20 MG capsule Take 1 capsule by mouth once daily 100 capsule 0   rosuvastatin (CRESTOR) 5 MG tablet Take 1 tablet (5 mg total) by mouth daily. 90 tablet 0   HYDROcodone-acetaminophen (NORCO/VICODIN) 5-325 MG tablet Take 2 tablets by mouth  every 6 (six) hours as needed for moderate pain. (Patient not taking: Reported on 08/27/2023) 20 tablet 0   No current facility-administered medications for this visit.    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   Chronic bilateral low back pain without sciatica 03/26/2021   S/p 4 lumbar spine surgeries   Chronic kidney disease (CKD), stage V (HCC)    Chronic obstructive pulmonary disease, unspecified (HCC) 07/11/2021   no inhalers   COVID-19 virus infection 08/31/2021   Cyst of kidney, acquired    Essential hypertension 03/26/2021   GERD (gastroesophageal reflux disease)    Hyperlipidemia    Hyperparathyroidism (HCC)    Hypertension    Mixed hyperlipidemia 03/26/2021   Pre-diabetes    Proteinuria    Thyroid disease    Tobacco use disorder     Past Surgical History:  Procedure Laterality Date   AV FISTULA PLACEMENT Left 05/01/2023   Procedure: ARTERIOVENOUS (AV) FISTULA CREATION ( RADIOCEPHALIC);  Surgeon: Annice Needy, MD;  Location: [facility] ORS;  Service: Vascular;  Laterality: Left;   CYSTOSCOPY W/ RETROGRADES Bilateral 07/03/2021   Procedure: CYSTOSCOPY WITH RETROGRADE PYELOGRAM;  Surgeon: Vanna Scotland, MD;  Location: [facility] OR[facility];  Service: Urology;  Laterality: Bilateral;   CYSTOSCOPY WITH LITHOLAPAXY N/A 07/03/2021   Procedure: CYSTOSCOPY WITH LITHOLAPAXY;  Surgeon: Vanna Scotland, MD;  Location: [facility];  Service: Urology;  Laterality: N/A;   HOLEP-LASER ENUCLEATION OF THE PROSTATE WITH MORCELLATION N/A 07/03/2021   Procedure: HOLEP-LASER ENUCLEATION OF THE PROSTATE WITH MORCELLATION;  Surgeon: Vanna Scotland, MD;  Location: [facility] OR[facility];  Service: Urology;  Laterality: N/A;   SPINE SURGERY  2015   X 4 surgery - lumbar disc     Social History   Tobacco Use   Smoking status: Heavy Smoker     Current packs/day: 1.00    Average packs/day: 1 pack/day for 54.0 years (54.0 ttl pk-yrs)    Types: Cigarettes    Passive exposure: Current   Smokeless tobacco: Never  Vaping Use   Vaping status: Never Used  Substance Use Topics   Alcohol use: Not Currently    Comment: quit drinking at 19   Drug use: Never      Family History  Problem Relation Age of Onset   Hypertension Mother    Heart disease Mother    Heart disease Sister    Hypertension Sister    Prostate cancer Neg Hx    Bladder Cancer Neg Hx    Kidney cancer Neg Hx     No Known Allergies   REVIEW OF SYSTEMS (Negative unless checked)  Constitutional: [] Weight loss  [] Fever  [] Chills Cardiac: [] Chest pain   [] Chest pressure   [] Palpitations   [] Shortness of breath when laying flat   [] Shortness of breath at rest   [] Shortness of breath with exertion. Vascular:  [] Pain in legs with walking   [] Pain in legs at rest   [] Pain in legs when laying flat   [] Claudication   [] Pain in feet when walking  [] Pain in feet at rest  [] Pain in feet when laying flat   [] History of DVT   [] Phlebitis   [] Swelling in legs   [] Varicose veins   [] Non-healing ulcers Pulmonary:   [] Uses home oxygen   [] Productive cough   [] Hemoptysis   [] Wheeze  [] COPD   [] Asthma Neurologic:  [] Dizziness  [] Blackouts   [] Seizures   [] History of stroke   [] History of TIA  [] Aphasia   [] Temporary blindness   [] Dysphagia   [] Weakness or numbness in arms   [] Weakness or numbness in legs Musculoskeletal:  [x] Arthritis   [] Joint swelling   [] Joint pain   [] Low back pain Hematologic:  [] Easy bruising  [] Easy bleeding   [] Hypercoagulable state   [x] Anemic   Gastrointestinal:  [] Blood in stool   [] Vomiting blood  [] Gastroesophageal reflux/heartburn   [] Abdominal pain Genitourinary:  [x] Chronic kidney disease   [] Difficult urination  [] Frequent urination  [] Burning with urination   [] Hematuria Skin:  [] Rashes   [] Ulcers   [] Wounds Psychological:  [] History of anxiety   []   History of major depression.  Physical Examination  BP 133/63 (BP Location: Right Arm)   Pulse 74   Resp 16   Wt 197 lb 6.4 oz (89.5 kg)   BMI 27.53 kg/m  Gen:  WD/WN, NAD Head: [place]/AT, No temporalis wasting. Ear/Nose/Throat: Hearing grossly intact, nares w/o erythema or drainage Eyes: Conjunctiva clear. Sclera non-icteric Neck: Supple.  Trachea midline Pulmonary:  Good air movement, no use of accessory muscles.  Cardiac: RRR, no JVD Vascular: excellent thrill is present in the left radiocephalic AV fistula.  There is 1 competing branch of moderate size.  The fistula is easily palpable and of good diameter.  Vessel Right Left  Radial Palpable Palpable               Musculoskeletal: M/S 5/5 throughout.  No deformity or atrophy. No edema. Neurologic: Sensation grossly intact in extremities.  Symmetrical.  Speech is fluent.  Psychiatric: Judgment intact, Mood & affect appropriate for pt's clinical situation. Dermatologic: No rashes or ulcers noted.  No cellulitis or open wounds.      Labs No results found for this or any previous visit (from the past 2160 hour(s)).  Radiology No results found.  Assessment/Plan  ESRD on dialysis [facility]) The patient has now started dialysis but had a major infiltration second week he was using his left radiocephalic AV fistula.  The infiltration is now healed.  He has an excellent thrill and the fistula is easily palpable.  This should now be usable again for dialysis.  He had a duplex last month showing only stable mildly elevated velocities in the distal forearm.  No role for intervention with this.  He does have at least 1 competing branch although this is not particularly large.  His fistula should be easily usable for dialysis but it is very important that he have experienced technologist do the access over the first couple of months because there is no scar tissue around the fistula and it is much harder to stick in the beginning.  I will plan  to see him back in 3 to 4 months with a duplex.  Essential hypertension blood pressure control important in reducing the progression of atherosclerotic disease. On appropriate oral medications.   Mixed hyperlipidemia lipid control important in reducing the progression of atherosclerotic disease. Continue statin therapy    [name], MD  09/16/2023 11:04 AM    This note was created with Dragon medical transcription system.  Any errors from dictation are purely unintentional

## 2023-09-16 NOTE — Assessment & Plan Note (Signed)
The patient has now started dialysis but had a major infiltration second week he was using his left radiocephalic AV fistula.  The infiltration is now healed.  He has an excellent thrill and the fistula is easily palpable.  This should now be usable again for dialysis.  He had a duplex last month showing only stable mildly elevated velocities in the distal forearm.  No role for intervention with this.  He does have at least 1 competing branch although this is not particularly large.  His fistula should be easily usable for dialysis but it is very important that he have experienced technologist do the access over the first couple of months because there is no scar tissue around the fistula and it is much harder to stick in the beginning.  I will plan to see him back in 3 to 4 months with a duplex.

## 2023-09-16 NOTE — Assessment & Plan Note (Signed)
lipid control important in reducing the progression of atherosclerotic disease. Continue statin therapy  

## 2023-09-16 NOTE — Assessment & Plan Note (Signed)
blood pressure control important in reducing the progression of atherosclerotic disease. On appropriate oral medications.  

## 2023-09-29 DIAGNOSIS — H2511 Age-related nuclear cataract, right eye: Secondary | ICD-10-CM | POA: Diagnosis not present

## 2023-09-29 DIAGNOSIS — H52223 Regular astigmatism, bilateral: Secondary | ICD-10-CM | POA: Diagnosis not present

## 2023-09-29 DIAGNOSIS — H2513 Age-related nuclear cataract, bilateral: Secondary | ICD-10-CM | POA: Diagnosis not present

## 2023-09-29 DIAGNOSIS — H5203 Hypermetropia, bilateral: Secondary | ICD-10-CM | POA: Diagnosis not present

## 2023-09-29 DIAGNOSIS — H25043 Posterior subcapsular polar age-related cataract, bilateral: Secondary | ICD-10-CM | POA: Diagnosis not present

## 2023-09-29 DIAGNOSIS — H25042 Posterior subcapsular polar age-related cataract, left eye: Secondary | ICD-10-CM | POA: Diagnosis not present

## 2023-09-29 DIAGNOSIS — H2512 Age-related nuclear cataract, left eye: Secondary | ICD-10-CM | POA: Diagnosis not present

## 2023-09-29 DIAGNOSIS — H25041 Posterior subcapsular polar age-related cataract, right eye: Secondary | ICD-10-CM | POA: Diagnosis not present

## 2023-09-30 ENCOUNTER — Other Ambulatory Visit: Payer: Self-pay | Admitting: Internal Medicine

## 2023-09-30 DIAGNOSIS — E782 Mixed hyperlipidemia: Secondary | ICD-10-CM

## 2023-10-01 NOTE — Telephone Encounter (Signed)
Requested Prescriptions  Pending Prescriptions Disp Refills   rosuvastatin (CRESTOR) 5 MG tablet [Pharmacy Med Name: Rosuvastatin Calcium 5 MG Oral Tablet] 90 tablet 0    Sig: Take 1 tablet by mouth once daily     Cardiovascular:  Antilipid - Statins 2 Failed - 09/30/2023  6:55 AM      Failed - Cr in normal range and within 360 days    Creatinine  Date Value Ref Range Status  02/26/2023 4.8 (A) 0.6 - 1.3 Final   Creatinine, Ser  Date Value Ref Range Status  09/01/2021 3.53 (H) 0.61 - 1.24 mg/dL Final         Failed - Lipid Panel in normal range within the last 12 months    Cholesterol, Total  Date Value Ref Range Status  03/19/2023 146 100 - 199 mg/dL Final   LDL Chol Calc (NIH)  Date Value Ref Range Status  03/19/2023 83 0 - 99 mg/dL Final   HDL  Date Value Ref Range Status  03/19/2023 32 (L) >39 mg/dL Final   Triglycerides  Date Value Ref Range Status  03/19/2023 180 (H) 0 - 149 mg/dL Final         Passed - Patient is not pregnant      Passed - Valid encounter within last 12 months    Recent Outpatient Visits           6 months ago Annual physical exam   [place] [facility] at [facility], Nyoka Cowden, MD   1 year ago Essential hypertension   [place] [facility] at [facility], Nyoka Cowden, MD   1 year ago Essential hypertension   [place] [facility] at [facility], Nyoka Cowden, MD   1 year ago Prediabetes   [facility] at [facility], Nyoka Cowden, MD   1 year ago Essential hypertension   [facility] at [facility], Nyoka Cowden, MD

## 2023-10-14 ENCOUNTER — Telehealth: Payer: Self-pay | Admitting: Internal Medicine

## 2023-10-14 NOTE — Telephone Encounter (Signed)
Medication Refill -  Most Recent Primary Care Visit:  Provider: Hal Hope  Department: [facility]  Visit Type: MEDICARE AWV, SEQUENTIAL  Date: 08/27/2023  Medication: Levothyroxzine 112 mcg tablet 1 daily  Has the patient contacted their pharmacy? Yes (Agent: If no, request that the patient contact the pharmacy for the refill. If patient does not wish to contact the pharmacy document the reason why and proceed with request.) (Agent: If yes, when and what did the pharmacy advise?)  Is this the correct pharmacy for this prescription? Yes If no, delete pharmacy and type the correct one.  This is the patient's preferred pharmacy:  [facility] [address], [place] - [address] [place] [address] Phone: [phone] Fax: [phone]   Has the prescription been filled recently? Yes  Is the patient out of the medication? Yes  Has the patient been seen for an appointment in the last year OR does the patient have an upcoming appointment? Yes  Can we respond through MyChart? No  Agent: Please be advised that Rx refills may take up to 3 business days. We ask that you follow-up with your pharmacy.

## 2023-10-15 DIAGNOSIS — N2581 Secondary hyperparathyroidism of renal origin: Secondary | ICD-10-CM | POA: Diagnosis not present

## 2023-10-15 DIAGNOSIS — R809 Proteinuria, unspecified: Secondary | ICD-10-CM | POA: Diagnosis not present

## 2023-10-15 DIAGNOSIS — N281 Cyst of kidney, acquired: Secondary | ICD-10-CM | POA: Diagnosis not present

## 2023-10-15 DIAGNOSIS — E875 Hyperkalemia: Secondary | ICD-10-CM | POA: Diagnosis not present

## 2023-10-15 DIAGNOSIS — J449 Chronic obstructive pulmonary disease, unspecified: Secondary | ICD-10-CM | POA: Diagnosis not present

## 2023-10-15 DIAGNOSIS — N185 Chronic kidney disease, stage 5: Secondary | ICD-10-CM | POA: Diagnosis not present

## 2023-10-15 DIAGNOSIS — N400 Enlarged prostate without lower urinary tract symptoms: Secondary | ICD-10-CM | POA: Diagnosis not present

## 2023-10-15 DIAGNOSIS — I10 Essential (primary) hypertension: Secondary | ICD-10-CM | POA: Diagnosis not present

## 2023-10-15 DIAGNOSIS — D631 Anemia in chronic kidney disease: Secondary | ICD-10-CM | POA: Diagnosis not present

## 2023-10-16 ENCOUNTER — Other Ambulatory Visit: Payer: Self-pay

## 2023-10-16 ENCOUNTER — Telehealth: Payer: Self-pay | Admitting: Internal Medicine

## 2023-10-16 DIAGNOSIS — E039 Hypothyroidism, unspecified: Secondary | ICD-10-CM

## 2023-10-16 MED ORDER — LEVOTHYROXINE SODIUM 112 MCG PO TABS: 112.0000 ug | ORAL_TABLET | Freq: Every day | ORAL | 0 refills | Status: DC

## 2023-10-16 NOTE — Telephone Encounter (Signed)
Copied from CRM [id number]. Topic: General - Other >> Oct 16, 2023  2:08 PM Franchot Heidelberg wrote: Reason for CRM: Pharmacy called reporting that the patient was in store today for his levothyroxine refill. They want to know if anyone can submit his refill request today

## 2023-10-16 NOTE — Telephone Encounter (Signed)
Refill sent ? ?KP ?

## 2023-11-18 ENCOUNTER — Other Ambulatory Visit: Payer: Self-pay | Admitting: Internal Medicine

## 2023-11-18 DIAGNOSIS — E039 Hypothyroidism, unspecified: Secondary | ICD-10-CM

## 2023-11-19 ENCOUNTER — Ambulatory Visit (INDEPENDENT_AMBULATORY_CARE_PROVIDER_SITE_OTHER): Payer: Medicare HMO | Admitting: Internal Medicine

## 2023-11-19 ENCOUNTER — Encounter: Payer: Self-pay | Admitting: Internal Medicine

## 2023-11-19 VITALS — BP 138/68 | HR 63 | Ht 71.0 in | Wt 198.2 lb

## 2023-11-19 DIAGNOSIS — Z992 Dependence on renal dialysis: Secondary | ICD-10-CM

## 2023-11-19 DIAGNOSIS — R0981 Nasal congestion: Secondary | ICD-10-CM | POA: Diagnosis not present

## 2023-11-19 DIAGNOSIS — E039 Hypothyroidism, unspecified: Secondary | ICD-10-CM | POA: Diagnosis not present

## 2023-11-19 DIAGNOSIS — J449 Chronic obstructive pulmonary disease, unspecified: Secondary | ICD-10-CM

## 2023-11-19 DIAGNOSIS — N186 End stage renal disease: Secondary | ICD-10-CM | POA: Diagnosis not present

## 2023-11-19 DIAGNOSIS — E782 Mixed hyperlipidemia: Secondary | ICD-10-CM | POA: Diagnosis not present

## 2023-11-19 MED ORDER — LEVOTHYROXINE SODIUM 112 MCG PO TABS: 112.0000 ug | ORAL_TABLET | Freq: Every day | ORAL | 1 refills | Status: DC

## 2023-11-19 NOTE — Assessment & Plan Note (Signed)
 On Crestor without side effects. Lab Results  Component Value Date   LDLCALC 83 03/19/2023

## 2023-11-19 NOTE — Assessment & Plan Note (Signed)
 He continues to smoke and does not use any MDIs - he has tried in the past without benefit Reviewed CXR from 06/2023 which did not show any changes of COPD

## 2023-11-19 NOTE — Patient Instructions (Signed)
 Use Nasocort spray daily.

## 2023-11-19 NOTE — Assessment & Plan Note (Signed)
 Supplemented Lab Results  Component Value Date   TSH 0.712 03/19/2023

## 2023-11-19 NOTE — Assessment & Plan Note (Addendum)
 Planning on trying with HD again at urging of Dr. Wyn Quaker

## 2023-11-19 NOTE — Progress Notes (Signed)
 Date:  11/19/2023   Name:  Tyrone Moore   DOB:  [birth year]   MRN:  [id number]   Chief Complaint: Hypertension  Hypertension This is a chronic problem. Associated symptoms include shortness of breath. Pertinent negatives include no headaches. Hypertensive end-organ damage includes kidney disease. Identifiable causes of hypertension include a thyroid  problem.  Thyroid  Problem Presents for follow-up visit. Symptoms include fatigue. Patient reports no anxiety, constipation or diarrhea. The symptoms have been stable. His past medical history is significant for hyperlipidemia.  Hyperlipidemia This is a chronic problem. The problem is controlled. Associated symptoms include shortness of breath. Current antihyperlipidemic treatment includes statins.    Review of Systems  Constitutional:  Positive for fatigue. Negative for chills, fever and unexpected weight change.  HENT:  Positive for congestion, postnasal drip and sinus pressure.   Respiratory:  Positive for cough and shortness of breath. Negative for chest tightness and wheezing.   Gastrointestinal:  Negative for abdominal pain, constipation and diarrhea.  Neurological:  Negative for dizziness and headaches.  Psychiatric/Behavioral:  Negative for dysphoric mood and sleep disturbance. The patient is not nervous/anxious.      Lab Results  Component Value Date   NA 141 02/26/2023   K 4.9 02/26/2023   CO2 21 02/26/2023   GLUCOSE 98 09/01/2021   BUN 46 (A) 02/26/2023   CREATININE 4.8 (A) 02/26/2023   CALCIUM  9.7 02/26/2023   EGFR 12 02/26/2023   GFRNONAA 18 (L) 09/01/2021   Lab Results  Component Value Date   CHOL 146 03/19/2023   HDL 32 (L) 03/19/2023   LDLCALC 83 03/19/2023   TRIG 180 (H) 03/19/2023   CHOLHDL 4.6 03/19/2023   Lab Results  Component Value Date   TSH 0.712 03/19/2023   Lab Results  Component Value Date   HGBA1C 5.8 (H) 03/19/2023   Lab Results  Component Value Date   WBC 7.4 02/26/2023   HGB 10.8 (A)  02/26/2023   HCT 33 (A) 02/26/2023   MCV 100.0 08/31/2021   PLT 153 02/26/2023   Lab Results  Component Value Date   ALT 21 03/19/2023   AST 21 03/19/2023   ALKPHOS 119 03/19/2023   BILITOT 0.3 03/19/2023   No results found for: MARIEN BOLLS, VD25OH   Patient Active Problem List   Diagnosis Date Noted   ESRD on dialysis (HCC) 09/16/2023   Chronic kidney disease (CKD), stage V (HCC) 04/15/2023   Prediabetes 09/17/2022   GERD without esophagitis 09/17/2022   History of stroke 09/01/2021   Anemia in chronic kidney disease 08/15/2021   Cyst of kidney, acquired 08/15/2021   Hyperparathyroidism due to renal insufficiency (HCC) 08/15/2021   Chronic obstructive pulmonary disease, unspecified (HCC) 07/11/2021   Proteinuria, unspecified 05/16/2021   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   Mixed hyperlipidemia 03/26/2021   Acquired hypothyroidism 03/26/2021   Essential hypertension 03/26/2021   Tobacco use disorder 03/26/2021   Chronic bilateral low back pain without sciatica 03/26/2021    No Known Allergies  Past Surgical History:  Procedure Laterality Date   AV FISTULA PLACEMENT Left 05/01/2023   Procedure: ARTERIOVENOUS (AV) FISTULA CREATION ( RADIOCEPHALIC);  Surgeon: Marea Selinda RAMAN, MD;  Location: [facility] ORS;  Service: Vascular;  Laterality: Left;   CYSTOSCOPY W/ RETROGRADES Bilateral 07/03/2021   Procedure: CYSTOSCOPY WITH RETROGRADE PYELOGRAM;  Surgeon: Penne Knee, MD;  Location: [facility];  Service: Urology;  Laterality: Bilateral;   CYSTOSCOPY WITH LITHOLAPAXY N/A 07/03/2021   Procedure: CYSTOSCOPY WITH LITHOLAPAXY;  Surgeon: Penne,  Rosina, MD;  Location: [facility] OR[facility];  Service: Urology;  Laterality: N/A;   HOLEP-LASER ENUCLEATION OF THE PROSTATE WITH MORCELLATION N/A 07/03/2021   Procedure: HOLEP-LASER ENUCLEATION OF THE PROSTATE WITH MORCELLATION;  Surgeon: Penne Rosina, MD;  Location: [facility] ORS;  Service: Urology;  Laterality: N/A;    SPINE SURGERY  2015   X 4 surgery - lumbar disc    Social History   Tobacco Use   Smoking status: Heavy Smoker    Current packs/day: 1.00    Average packs/day: 1 pack/day for 54.0 years (54.0 ttl pk-yrs)    Types: Cigarettes    Passive exposure: Current   Smokeless tobacco: Never  Vaping Use   Vaping status: Never Used  Substance Use Topics   Alcohol use: Not Currently    Comment: quit drinking at 19   Drug use: Never     Medication list has been reviewed and updated.  Current Meds  Medication Sig   acetaminophen  (TYLENOL ) 500 MG tablet Take 1,000 mg by mouth every 6 (six) hours as needed for moderate pain.   amLODipine  (NORVASC ) 2.5 MG tablet Take 2.5 mg by mouth at bedtime.   aspirin  EC 81 MG EC tablet Take 1 tablet (81 mg total) by mouth daily. Swallow whole. (Patient taking differently: Take 81 mg by mouth at bedtime. Swallow whole.)   calcitRIOL  (ROCALTROL ) 0.25 MCG capsule Take 0.25 mcg by mouth at bedtime.   carvedilol  (COREG ) 25 MG tablet Take 1 tablet (25 mg total) by mouth 2 (two) times daily with a meal.   Cholecalciferol  (D3-1000 PO) Take 2,000 Units by mouth daily.   ferrous sulfate  325 (65 FE) MG tablet Take 325 mg by mouth.   lidocaine -prilocaine  (EMLA ) cream Apply 1 Application topically as directed.   Multiple Vitamins-Minerals (MULTIVITAL-M PO) Take 1 tablet by mouth at bedtime.   omeprazole  (PRILOSEC) 20 MG capsule Take 1 capsule by mouth once daily   rosuvastatin  (CRESTOR ) 5 MG tablet Take 1 tablet by mouth once daily   [DISCONTINUED] levothyroxine  (EUTHYROX ) 112 MCG tablet Take 1 tablet (112 mcg total) by mouth daily before breakfast.       11/19/2023    3:05 PM 03/19/2023    8:06 AM 09/17/2022    1:19 PM 06/27/2022   10:04 AM  GAD 7 : Generalized Anxiety Score  Nervous, Anxious, on Edge 0 0 0 0  Control/stop worrying 0 0 0 0  Worry too much - different things 1 1 0 0  Trouble relaxing 1 0 0 0  Restless 1 0 0 0  Easily annoyed or irritable 1 1 0 0   Afraid - awful might happen 0 0 0 0  Total GAD 7 Score 4 2 0 0  Anxiety Difficulty Not difficult at all Not difficult at all Not difficult at all Not difficult at all       11/19/2023    3:05 PM 08/27/2023    9:41 AM 03/19/2023    8:06 AM  Depression screen PHQ 2/9  Decreased Interest 0 0 0  Down, Depressed, Hopeless 1 0 0  PHQ - 2 Score 1 0 0  Altered sleeping 1 0 0  Tired, decreased energy 2 0 1  Change in appetite 1 0 1  Feeling bad or failure about yourself  1 0 0  Trouble concentrating 0 0 0  Moving slowly or fidgety/restless 0 0 0  Suicidal thoughts 0 0 0  PHQ-9 Score 6 0 2  Difficult doing work/chores Not difficult at all Not  difficult at all Not difficult at all    BP Readings from Last 3 Encounters:  11/19/23 138/68  09/16/23 133/63  08/27/23 126/66    Physical Exam Vitals and nursing note reviewed.  Constitutional:      General: He is not in acute distress.    Appearance: Normal appearance. He is well-developed.  HENT:     Head: Normocephalic and atraumatic.  Neck:     Vascular: No carotid bruit.  Cardiovascular:     Rate and Rhythm: Normal rate and regular rhythm.     Arteriovenous access: Left arteriovenous access is present. Pulmonary:     Effort: Pulmonary effort is normal. No respiratory distress.     Breath sounds: No wheezing or rhonchi.  Musculoskeletal:     Cervical back: Normal range of motion.     Right lower leg: No edema.     Left lower leg: No edema.  Lymphadenopathy:     Cervical: No cervical adenopathy.  Skin:    General: Skin is warm and dry.     Findings: No rash.  Neurological:     Mental Status: He is alert and oriented to person, place, and time.  Psychiatric:        Mood and Affect: Mood normal.        Behavior: Behavior normal.     Wt Readings from Last 3 Encounters:  11/19/23 198 lb 3.2 oz (89.9 kg)  09/16/23 197 lb 6.4 oz (89.5 kg)  08/27/23 199 lb 6.4 oz (90.4 kg)    BP 138/68   Pulse 63   Ht 5' 11 (1.803 m)    Wt 198 lb 3.2 oz (89.9 kg)   SpO2 94%   BMI 27.64 kg/m   Assessment and Plan:  Problem List Items Addressed This Visit       Unprioritized   Mixed hyperlipidemia (Chronic)   On Crestor  without side effects. Lab Results  Component Value Date   LDLCALC 83 03/19/2023         Acquired hypothyroidism (Chronic)   Supplemented Lab Results  Component Value Date   TSH 0.712 03/19/2023         Relevant Medications   levothyroxine  (EUTHYROX ) 112 MCG tablet   Chronic obstructive pulmonary disease, unspecified (HCC) (Chronic)   He continues to smoke and does not use any MDIs - he has tried in the past without benefit Reviewed CXR from 06/2023 which did not show any changes of COPD      ESRD on dialysis [facility]) - Primary   Planning on trying with HD again at urging of Dr. Marea      Other Visit Diagnoses       Sinus congestion       recommend Nasocort spray daily       Return in about 6 months (around 05/18/2024) for thyroid , lipids.    Leita HILARIO Adie, MD [facility] [place]

## 2023-11-27 DIAGNOSIS — N185 Chronic kidney disease, stage 5: Secondary | ICD-10-CM | POA: Diagnosis not present

## 2023-11-27 DIAGNOSIS — N2581 Secondary hyperparathyroidism of renal origin: Secondary | ICD-10-CM | POA: Diagnosis not present

## 2023-11-27 DIAGNOSIS — I10 Essential (primary) hypertension: Secondary | ICD-10-CM | POA: Diagnosis not present

## 2023-11-27 DIAGNOSIS — D631 Anemia in chronic kidney disease: Secondary | ICD-10-CM | POA: Diagnosis not present

## 2023-12-10 ENCOUNTER — Other Ambulatory Visit: Payer: Self-pay | Admitting: Internal Medicine

## 2023-12-10 DIAGNOSIS — K219 Gastro-esophageal reflux disease without esophagitis: Secondary | ICD-10-CM

## 2023-12-11 NOTE — Telephone Encounter (Signed)
Requested Prescriptions  Pending Prescriptions Disp Refills   omeprazole (PRILOSEC) 20 MG capsule [Pharmacy Med Name: Omeprazole 20 MG Oral Capsule Delayed Release] 100 capsule 0    Sig: Take 1 capsule by mouth once daily     Gastroenterology: Proton Pump Inhibitors Passed - 12/11/2023  3:05 PM      Passed - Valid encounter within last 12 months    Recent Outpatient Visits           3 weeks ago ESRD on dialysis [facility])   [place] [facility] at [facility], Nyoka Cowden, MD   8 months ago Annual physical exam   [facility] at [facility], Nyoka Cowden, MD   1 year ago Essential hypertension   [place] [facility] at [facility], Nyoka Cowden, MD   1 year ago Essential hypertension   [place] [facility] at [facility], Nyoka Cowden, MD   1 year ago Prediabetes   [facility] at [facility] Nyoka Cowden, MD       Future Appointments             In 5 months Judithann Graves, Nyoka Cowden, MD [facility] at [facility], [facility]

## 2023-12-16 ENCOUNTER — Ambulatory Visit (INDEPENDENT_AMBULATORY_CARE_PROVIDER_SITE_OTHER): Payer: Medicare HMO | Admitting: Vascular Surgery

## 2023-12-16 ENCOUNTER — Encounter (INDEPENDENT_AMBULATORY_CARE_PROVIDER_SITE_OTHER): Payer: Medicare HMO

## 2023-12-18 DIAGNOSIS — Z992 Dependence on renal dialysis: Secondary | ICD-10-CM | POA: Diagnosis not present

## 2023-12-18 DIAGNOSIS — N186 End stage renal disease: Secondary | ICD-10-CM | POA: Diagnosis not present

## 2023-12-20 DIAGNOSIS — N186 End stage renal disease: Secondary | ICD-10-CM | POA: Diagnosis not present

## 2023-12-20 DIAGNOSIS — Z992 Dependence on renal dialysis: Secondary | ICD-10-CM | POA: Diagnosis not present

## 2023-12-23 DIAGNOSIS — Z992 Dependence on renal dialysis: Secondary | ICD-10-CM | POA: Diagnosis not present

## 2023-12-23 DIAGNOSIS — N186 End stage renal disease: Secondary | ICD-10-CM | POA: Diagnosis not present

## 2023-12-25 DIAGNOSIS — N186 End stage renal disease: Secondary | ICD-10-CM | POA: Diagnosis not present

## 2023-12-25 DIAGNOSIS — Z992 Dependence on renal dialysis: Secondary | ICD-10-CM | POA: Diagnosis not present

## 2023-12-27 DIAGNOSIS — N186 End stage renal disease: Secondary | ICD-10-CM | POA: Diagnosis not present

## 2023-12-27 DIAGNOSIS — Z992 Dependence on renal dialysis: Secondary | ICD-10-CM | POA: Diagnosis not present

## 2023-12-30 DIAGNOSIS — N186 End stage renal disease: Secondary | ICD-10-CM | POA: Diagnosis not present

## 2023-12-30 DIAGNOSIS — Z992 Dependence on renal dialysis: Secondary | ICD-10-CM | POA: Diagnosis not present

## 2024-01-01 ENCOUNTER — Other Ambulatory Visit: Payer: Self-pay | Admitting: Internal Medicine

## 2024-01-01 DIAGNOSIS — Z992 Dependence on renal dialysis: Secondary | ICD-10-CM | POA: Diagnosis not present

## 2024-01-01 DIAGNOSIS — I10 Essential (primary) hypertension: Secondary | ICD-10-CM

## 2024-01-01 DIAGNOSIS — N186 End stage renal disease: Secondary | ICD-10-CM | POA: Diagnosis not present

## 2024-01-01 NOTE — Telephone Encounter (Signed)
LOV 11/19/2023.   Labs in date.  Requested Prescriptions  Pending Prescriptions Disp Refills   carvedilol (COREG) 25 MG tablet [Pharmacy Med Name: Carvedilol 25 MG Oral Tablet] 200 tablet 0    Sig: TAKE 1 TABLET BY MOUTH TWICE DAILY WITH A MEAL     Cardiovascular: Beta Blockers 3 Failed - 01/01/2024  3:48 PM      Failed - Cr in normal range and within 360 days    Creatinine  Date Value Ref Range Status  02/26/2023 4.8 (A) 0.6 - 1.3 Final   Creatinine, Ser  Date Value Ref Range Status  09/01/2021 3.53 (H) 0.61 - 1.24 mg/dL Final         Passed - AST in normal range and within 360 days    AST  Date Value Ref Range Status  03/19/2023 21 0 - 40 IU/L Final         Passed - ALT in normal range and within 360 days    ALT  Date Value Ref Range Status  03/19/2023 21 0 - 44 IU/L Final         Passed - Last BP in normal range    BP Readings from Last 1 Encounters:  11/19/23 138/68         Passed - Last Heart Rate in normal range    Pulse Readings from Last 1 Encounters:  11/19/23 63         Passed - Valid encounter within last 6 months    Recent Outpatient Visits           1 month ago ESRD on dialysis [facility])   [place] [facility] at [facility], Nyoka Cowden, MD   9 months ago Annual physical exam   [facility] at [facility], Nyoka Cowden, MD   1 year ago Essential hypertension   [place] [facility] at [facility], Nyoka Cowden, MD   1 year ago Essential hypertension   [place] [facility] at [facility], Nyoka Cowden, MD   1 year ago Prediabetes   [facility] at [facility], Nyoka Cowden, MD       Future Appointments             In 4 months Judithann Graves, Nyoka Cowden, MD [facility] Medicine at [facility], [facility]

## 2024-01-03 DIAGNOSIS — Z992 Dependence on renal dialysis: Secondary | ICD-10-CM | POA: Diagnosis not present

## 2024-01-03 DIAGNOSIS — N186 End stage renal disease: Secondary | ICD-10-CM | POA: Diagnosis not present

## 2024-01-06 DIAGNOSIS — N186 End stage renal disease: Secondary | ICD-10-CM | POA: Diagnosis not present

## 2024-01-06 DIAGNOSIS — Z992 Dependence on renal dialysis: Secondary | ICD-10-CM | POA: Diagnosis not present

## 2024-01-08 DIAGNOSIS — Z992 Dependence on renal dialysis: Secondary | ICD-10-CM | POA: Diagnosis not present

## 2024-01-08 DIAGNOSIS — N186 End stage renal disease: Secondary | ICD-10-CM | POA: Diagnosis not present

## 2024-01-09 DIAGNOSIS — Z992 Dependence on renal dialysis: Secondary | ICD-10-CM | POA: Diagnosis not present

## 2024-01-09 DIAGNOSIS — N186 End stage renal disease: Secondary | ICD-10-CM | POA: Diagnosis not present

## 2024-01-10 DIAGNOSIS — Z992 Dependence on renal dialysis: Secondary | ICD-10-CM | POA: Diagnosis not present

## 2024-01-10 DIAGNOSIS — N186 End stage renal disease: Secondary | ICD-10-CM | POA: Diagnosis not present

## 2024-01-13 DIAGNOSIS — Z992 Dependence on renal dialysis: Secondary | ICD-10-CM | POA: Diagnosis not present

## 2024-01-13 DIAGNOSIS — N186 End stage renal disease: Secondary | ICD-10-CM | POA: Diagnosis not present

## 2024-01-17 DIAGNOSIS — Z992 Dependence on renal dialysis: Secondary | ICD-10-CM | POA: Diagnosis not present

## 2024-01-17 DIAGNOSIS — N186 End stage renal disease: Secondary | ICD-10-CM | POA: Diagnosis not present

## 2024-01-20 DIAGNOSIS — N186 End stage renal disease: Secondary | ICD-10-CM | POA: Diagnosis not present

## 2024-01-20 DIAGNOSIS — Z992 Dependence on renal dialysis: Secondary | ICD-10-CM | POA: Diagnosis not present

## 2024-01-22 DIAGNOSIS — N186 End stage renal disease: Secondary | ICD-10-CM | POA: Diagnosis not present

## 2024-01-22 DIAGNOSIS — Z992 Dependence on renal dialysis: Secondary | ICD-10-CM | POA: Diagnosis not present

## 2024-01-24 DIAGNOSIS — N186 End stage renal disease: Secondary | ICD-10-CM | POA: Diagnosis not present

## 2024-01-24 DIAGNOSIS — Z992 Dependence on renal dialysis: Secondary | ICD-10-CM | POA: Diagnosis not present

## 2024-01-27 DIAGNOSIS — Z992 Dependence on renal dialysis: Secondary | ICD-10-CM | POA: Diagnosis not present

## 2024-01-27 DIAGNOSIS — N186 End stage renal disease: Secondary | ICD-10-CM | POA: Diagnosis not present

## 2024-01-29 DIAGNOSIS — Z992 Dependence on renal dialysis: Secondary | ICD-10-CM | POA: Diagnosis not present

## 2024-01-29 DIAGNOSIS — N186 End stage renal disease: Secondary | ICD-10-CM | POA: Diagnosis not present

## 2024-02-03 DIAGNOSIS — Z992 Dependence on renal dialysis: Secondary | ICD-10-CM | POA: Diagnosis not present

## 2024-02-03 DIAGNOSIS — N186 End stage renal disease: Secondary | ICD-10-CM | POA: Diagnosis not present

## 2024-02-05 DIAGNOSIS — Z992 Dependence on renal dialysis: Secondary | ICD-10-CM | POA: Diagnosis not present

## 2024-02-05 DIAGNOSIS — N186 End stage renal disease: Secondary | ICD-10-CM | POA: Diagnosis not present

## 2024-02-07 DIAGNOSIS — N186 End stage renal disease: Secondary | ICD-10-CM | POA: Diagnosis not present

## 2024-02-07 DIAGNOSIS — Z992 Dependence on renal dialysis: Secondary | ICD-10-CM | POA: Diagnosis not present

## 2024-02-09 DIAGNOSIS — N186 End stage renal disease: Secondary | ICD-10-CM | POA: Diagnosis not present

## 2024-02-09 DIAGNOSIS — Z992 Dependence on renal dialysis: Secondary | ICD-10-CM | POA: Diagnosis not present

## 2024-02-26 ENCOUNTER — Emergency Department
Admission: EM | Admit: 2024-02-26 | Discharge: 2024-02-26 | Disposition: A | Discharge status: Home / Self Care | Attending: Emergency Medicine | Admitting: Emergency Medicine

## 2024-02-26 DIAGNOSIS — R42 Dizziness and giddiness: Secondary | ICD-10-CM | POA: Diagnosis present

## 2024-02-26 DIAGNOSIS — I10 Essential (primary) hypertension: Secondary | ICD-10-CM

## 2024-02-26 LAB — CBC
HCT: 35 % — ABNORMAL LOW (ref 39.0–52.0)
Hemoglobin: 11.2 g/dL — ABNORMAL LOW (ref 13.0–17.0)
MCH: 33.9 pg (ref 26.0–34.0)
MCHC: 32 g/dL (ref 30.0–36.0)
MCV: 106.1 fL — ABNORMAL HIGH (ref 80.0–100.0)
Platelets: 141 10*3/uL — ABNORMAL LOW (ref 150–400)
RBC: 3.3 MIL/uL — ABNORMAL LOW (ref 4.22–5.81)
RDW: 13 % (ref 11.5–15.5)
WBC: 8.1 10*3/uL (ref 4.0–10.5)
nRBC: 0 % (ref 0.0–0.2)

## 2024-02-26 LAB — COMPREHENSIVE METABOLIC PANEL WITH GFR
ALT: 29 U/L (ref 0–44)
AST: 28 U/L (ref 15–41)
Albumin: 3.7 g/dL (ref 3.5–5.0)
Alkaline Phosphatase: 106 U/L (ref 38–126)
Anion gap: 10 (ref 5–15)
BUN: 38 mg/dL — ABNORMAL HIGH (ref 8–23)
CO2: 25 mmol/L (ref 22–32)
Calcium: 9.3 mg/dL (ref 8.9–10.3)
Chloride: 108 mmol/L (ref 98–111)
Creatinine, Ser: 4.57 mg/dL — ABNORMAL HIGH (ref 0.61–1.24)
GFR, Estimated: 13 mL/min — ABNORMAL LOW (ref >60)
Glucose, Bld: 96 mg/dL (ref 70–99)
Potassium: 4.6 mmol/L (ref 3.5–5.1)
Sodium: 143 mmol/L (ref 135–145)
Total Bilirubin: 0.6 mg/dL (ref 0.0–1.2)
Total Protein: 7.6 g/dL (ref 6.5–8.1)

## 2024-02-26 LAB — TROPONIN I (HIGH SENSITIVITY): Troponin I (High Sensitivity): 19 ng/L — ABNORMAL HIGH (ref <18)

## 2024-02-26 MED ORDER — CLONIDINE HCL 0.1 MG PO TABS
0.1000 mg | ORAL_TABLET | Freq: Once | ORAL | Status: AC
  Administered 2024-02-26: 0.1 mg via ORAL
  Filled 2024-02-26: qty 1

## 2024-02-26 NOTE — ED Provider Notes (Signed)
 [facility] Provider Note    Event Date/Time   First MD Initiated Contact with Patient 02/26/24 2109     (approximate)   History   Hypertension   HPI  Tyrone Moore is a 73 y.o. male who does receive dialysis on Tuesdays Thursdays and Saturdays who comes in with concerns for elevated blood pressure nausea and dizziness.  Patient has not been taking blood pressure medications as prescribed  On review of records patient is on carvedilol 25 mg, amlodipine 2.5.  I reviewed an office visit from 11/19/2023   Pt states this morning around 930AM he developed some dizziness, nausea.  He reports having intermittent episodes for a few days now.  He states because of this he did not go to dialysis.  He states that sometimes he has the dizzy episodes during dialysis and they have recently prescribed him Zofran.  He states that it lasted about an hour but because of it he did not go to dialysis.  He states that he feels like his normal self but given his blood pressure was elevated his family had him come into the emergency room.  He reports that he was not taking his carvedilol but did just restart it 2 days ago and he does not think he is taking the amlodipine.  Patient did miss dialysis today and was last dialyzed on Tuesday but they state that he still makes good urine output.  Denies any new shortness of breath.  Physical Exam   Triage Vital Signs: ED Triage Vitals  Encounter Vitals Group     BP 02/26/24 1944 (!) 200/110     Systolic BP Percentile --      Diastolic BP Percentile --      Pulse Rate 02/26/24 1944 76     Resp 02/26/24 1944 18     Temp 02/26/24 1944 97.9 F (36.6 C)     Temp Source 02/26/24 1944 Oral     SpO2 02/26/24 1944 95 %     Weight 02/26/24 1941 204 lb (92.5 kg)     Height 02/26/24 1941 5\' 11"  (1.803 m)     Head Circumference --      Peak Flow --      Pain Score 02/26/24 1941 0     Pain Loc --      Pain Education --      Exclude from  Growth Chart --     Most recent vital signs: Vitals:   02/26/24 1944 02/26/24 2053  BP: (!) 200/110 (!) 207/97  Pulse: 76 68  Resp: 18 (!) 22  Temp: 97.9 F (36.6 C)   SpO2: 95% 97%     General: Awake, no distress.  CV:  Good peripheral perfusion.  Resp:  Normal effort.  Abd:  No distention.  Other:  Cranial nerves II through XII are intact.  Equal strength in arms and legs finger-to-nose intact.  Patient is ambulatory without any ataxia   ED Results / Procedures / Treatments   Labs (all labs ordered are listed, but only abnormal results are displayed) Labs Reviewed  CBC - Abnormal; Notable for the following components:      Result Value   RBC 3.30 (*)    Hemoglobin 11.2 (*)    HCT 35.0 (*)    MCV 106.1 (*)    Platelets 141 (*)    All other components within normal limits  COMPREHENSIVE METABOLIC PANEL WITH GFR - Abnormal; Notable for the following components:  BUN 38 (*)    Creatinine, Ser 4.57 (*)    GFR, Estimated 13 (*)    All other components within normal limits  TROPONIN I (HIGH SENSITIVITY) - Abnormal; Notable for the following components:   Troponin I (High Sensitivity) 19 (*)    All other components within normal limits  URINALYSIS, ROUTINE W REFLEX MICROSCOPIC     EKG  My interpretation of EKG:  Sinus rate of 73 without any ST elevation, T wave inversions in the inferior leads, which look a little bit more pronounced than his EKG on 05-09-23  RADIOLOGY    PROCEDURES:  Critical Care performed: No  Procedures   MEDICATIONS ORDERED IN ED: Medications  cloNIDine (CATAPRES) tablet 0.1 mg (has no administration in time range)     IMPRESSION / MDM / ASSESSMENT AND PLAN / ED COURSE  I reviewed the triage vital signs and the nursing notes.   Patient's presentation is most consistent with acute presentation with potential threat to life or bodily function.  Differential includes Electra abnormalities, AKI, stroke  Patient completely  asymptomatic.  We discussed CT imaging evaluate for for intercranial hemorrhage but patient has normal neurological examination and denies any headaches and states that he feels that his normal self.  He declined CT head.  We discussed MRI to evaluate for stroke given he has had prior MRI showing some narrowing but again patient is asymptomatic and reports being at baseline self so declines MRI.  We discussed repeat troponin given slight elevation and slight EKG changes but he denies any chest pain, shortness of breath and again declines repeat troponin.  Patient stated he would like to go home  Troponin was 19.  CBC shows stable hemoglobin and normal white count.  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  Patient's not hypoxic he does have some edema in his legs so I suspect that dialysis can pull off some fluid to help with his blood pressure.  And then if his blood pressures are still running elevated they can consider adding back his amlodipine.  Patient was willing to take the clonidine but does not want to stay for recheck of blood pressure.  They report that they have blood pressure cuffs at home and they have somebody at home that can stay with him.  Both the son and daughter-in-law are witnesses to my conversation with patient and he understands that there is a risk for stroke, head bleeds, heart attacks but at this time patient reports feeling at his baseline self and prefers to go home.   FINAL CLINICAL IMPRESSION(S) / ED DIAGNOSES   Final diagnoses:  Hypertension, unspecified type  Dizziness     Rx / DC Orders   ED Discharge Orders     None        Note:  This document was prepared using Dragon voice recognition software and may include unintentional dictation errors.   Lubertha Rush, MD 02/26/24 2211

## 2024-02-26 NOTE — Discharge Instructions (Addendum)
 We discussed CT imaging, MRI to evaluate for your dizzy episode but given you had no symptoms at this time you have declined however you should return to the ER to be evaluated during a dizzy episode to see if additional workup is needed.  We have given you a dose of blood pressure medicine to lower your blood pressure overnight but you should talk to your nephrologist tomorrow.  Your blood pressure is most likely elevated secondary to missing dialysis.  You should call your dialysis clinic and try to get in tomorrow if possible.  You will want to discuss with your nephrologist if they want you to restart your amlodipine if your blood pressures remain elevated even after dialysis.

## 2024-02-26 NOTE — ED Triage Notes (Signed)
 Pt presents to the ED POV from home with family. Pt has had hypertension, nausea and dizziness that started today. Pt receives dialysis on Tuesdays, Thursdays and Saturdays.   Pt's daughter mentioned that patient has not been taking his BP medication as prescribed and that he had it mixed up with his cholesterol medication.

## 2024-02-26 NOTE — ED Notes (Addendum)
 Pt usually TThSat dialysis, did not go today d/t feeling nauseated and dizzy. Reports he has been taking BP meds as prescribed. Pt denies any complaints at this time. States he feels good and is ready to go home. AAOx4, family at bedside

## 2024-03-11 DIAGNOSIS — Z992 Dependence on renal dialysis: Secondary | ICD-10-CM | POA: Diagnosis not present

## 2024-03-11 DIAGNOSIS — N186 End stage renal disease: Secondary | ICD-10-CM | POA: Diagnosis not present

## 2024-03-13 DIAGNOSIS — N186 End stage renal disease: Secondary | ICD-10-CM | POA: Diagnosis not present

## 2024-03-13 DIAGNOSIS — Z992 Dependence on renal dialysis: Secondary | ICD-10-CM | POA: Diagnosis not present

## 2024-03-16 DIAGNOSIS — N186 End stage renal disease: Secondary | ICD-10-CM | POA: Diagnosis not present

## 2024-03-16 DIAGNOSIS — Z992 Dependence on renal dialysis: Secondary | ICD-10-CM | POA: Diagnosis not present

## 2024-03-18 DIAGNOSIS — Z992 Dependence on renal dialysis: Secondary | ICD-10-CM | POA: Diagnosis not present

## 2024-03-18 DIAGNOSIS — N186 End stage renal disease: Secondary | ICD-10-CM | POA: Diagnosis not present

## 2024-03-20 DIAGNOSIS — Z992 Dependence on renal dialysis: Secondary | ICD-10-CM | POA: Diagnosis not present

## 2024-03-20 DIAGNOSIS — N186 End stage renal disease: Secondary | ICD-10-CM | POA: Diagnosis not present

## 2024-03-23 DIAGNOSIS — Z992 Dependence on renal dialysis: Secondary | ICD-10-CM | POA: Diagnosis not present

## 2024-03-23 DIAGNOSIS — N186 End stage renal disease: Secondary | ICD-10-CM | POA: Diagnosis not present

## 2024-03-25 DIAGNOSIS — Z992 Dependence on renal dialysis: Secondary | ICD-10-CM | POA: Diagnosis not present

## 2024-03-25 DIAGNOSIS — N186 End stage renal disease: Secondary | ICD-10-CM | POA: Diagnosis not present

## 2024-03-27 DIAGNOSIS — Z992 Dependence on renal dialysis: Secondary | ICD-10-CM | POA: Diagnosis not present

## 2024-03-27 DIAGNOSIS — N186 End stage renal disease: Secondary | ICD-10-CM | POA: Diagnosis not present

## 2024-03-29 ENCOUNTER — Other Ambulatory Visit: Payer: Self-pay | Admitting: Internal Medicine

## 2024-03-29 DIAGNOSIS — K219 Gastro-esophageal reflux disease without esophagitis: Secondary | ICD-10-CM

## 2024-03-30 ENCOUNTER — Encounter (INDEPENDENT_AMBULATORY_CARE_PROVIDER_SITE_OTHER): Payer: Self-pay

## 2024-03-30 ENCOUNTER — Observation Stay
Admission: EM | Admit: 2024-03-30 | Discharge: 2024-04-01 | Disposition: A | Discharge status: Home / Self Care | Attending: Internal Medicine | Admitting: Internal Medicine

## 2024-03-30 ENCOUNTER — Observation Stay

## 2024-03-30 ENCOUNTER — Other Ambulatory Visit: Payer: Self-pay

## 2024-03-30 ENCOUNTER — Emergency Department

## 2024-03-30 DIAGNOSIS — Z79890 Hormone replacement therapy (postmenopausal): Secondary | ICD-10-CM | POA: Diagnosis not present

## 2024-03-30 DIAGNOSIS — F17200 Nicotine dependence, unspecified, uncomplicated: Secondary | ICD-10-CM | POA: Diagnosis present

## 2024-03-30 DIAGNOSIS — N186 End stage renal disease: Secondary | ICD-10-CM | POA: Insufficient documentation

## 2024-03-30 DIAGNOSIS — H9192 Unspecified hearing loss, left ear: Secondary | ICD-10-CM | POA: Insufficient documentation

## 2024-03-30 DIAGNOSIS — Z8616 Personal history of COVID-19: Secondary | ICD-10-CM | POA: Diagnosis not present

## 2024-03-30 DIAGNOSIS — F17210 Nicotine dependence, cigarettes, uncomplicated: Secondary | ICD-10-CM | POA: Insufficient documentation

## 2024-03-30 DIAGNOSIS — R930 Abnormal findings on diagnostic imaging of skull and head, not elsewhere classified: Secondary | ICD-10-CM | POA: Insufficient documentation

## 2024-03-30 DIAGNOSIS — R2681 Unsteadiness on feet: Secondary | ICD-10-CM | POA: Insufficient documentation

## 2024-03-30 DIAGNOSIS — R531 Weakness: Secondary | ICD-10-CM | POA: Diagnosis present

## 2024-03-30 DIAGNOSIS — Z8673 Personal history of transient ischemic attack (TIA), and cerebral infarction without residual deficits: Secondary | ICD-10-CM | POA: Diagnosis not present

## 2024-03-30 DIAGNOSIS — Z79899 Other long term (current) drug therapy: Secondary | ICD-10-CM | POA: Insufficient documentation

## 2024-03-30 DIAGNOSIS — I120 Hypertensive chronic kidney disease with stage 5 chronic kidney disease or end stage renal disease: Secondary | ICD-10-CM | POA: Diagnosis not present

## 2024-03-30 DIAGNOSIS — E039 Hypothyroidism, unspecified: Secondary | ICD-10-CM | POA: Diagnosis not present

## 2024-03-30 DIAGNOSIS — R29818 Other symptoms and signs involving the nervous system: Principal | ICD-10-CM | POA: Insufficient documentation

## 2024-03-30 DIAGNOSIS — M7981 Nontraumatic hematoma of soft tissue: Secondary | ICD-10-CM | POA: Diagnosis not present

## 2024-03-30 DIAGNOSIS — Z992 Dependence on renal dialysis: Secondary | ICD-10-CM

## 2024-03-30 DIAGNOSIS — I639 Cerebral infarction, unspecified: Secondary | ICD-10-CM | POA: Diagnosis present

## 2024-03-30 DIAGNOSIS — E782 Mixed hyperlipidemia: Secondary | ICD-10-CM | POA: Diagnosis not present

## 2024-03-30 DIAGNOSIS — H9312 Tinnitus, left ear: Secondary | ICD-10-CM | POA: Insufficient documentation

## 2024-03-30 DIAGNOSIS — I10 Essential (primary) hypertension: Secondary | ICD-10-CM | POA: Diagnosis present

## 2024-03-30 DIAGNOSIS — G459 Transient cerebral ischemic attack, unspecified: Secondary | ICD-10-CM | POA: Diagnosis not present

## 2024-03-30 DIAGNOSIS — Z7982 Long term (current) use of aspirin: Secondary | ICD-10-CM | POA: Insufficient documentation

## 2024-03-30 DIAGNOSIS — R200 Anesthesia of skin: Secondary | ICD-10-CM

## 2024-03-30 DIAGNOSIS — R29898 Other symptoms and signs involving the musculoskeletal system: Secondary | ICD-10-CM | POA: Insufficient documentation

## 2024-03-30 DIAGNOSIS — R202 Paresthesia of skin: Secondary | ICD-10-CM | POA: Diagnosis not present

## 2024-03-30 LAB — CBC
HCT: 36.4 % — ABNORMAL LOW (ref 39.0–52.0)
Hemoglobin: 11.7 g/dL — ABNORMAL LOW (ref 13.0–17.0)
MCH: 33.2 pg (ref 26.0–34.0)
MCHC: 32.1 g/dL (ref 30.0–36.0)
MCV: 103.4 fL — ABNORMAL HIGH (ref 80.0–100.0)
Platelets: 143 10*3/uL — ABNORMAL LOW (ref 150–400)
RBC: 3.52 MIL/uL — ABNORMAL LOW (ref 4.22–5.81)
RDW: 13.1 % (ref 11.5–15.5)
WBC: 8.8 10*3/uL (ref 4.0–10.5)
nRBC: 0 % (ref 0.0–0.2)

## 2024-03-30 LAB — DIFFERENTIAL
Abs Immature Granulocytes: 0.02 10*3/uL (ref 0.00–0.07)
Basophils Absolute: 0.1 10*3/uL (ref 0.0–0.1)
Basophils Relative: 1 %
Eosinophils Absolute: 0.3 10*3/uL (ref 0.0–0.5)
Eosinophils Relative: 3 %
Immature Granulocytes: 0 %
Lymphocytes Relative: 14 %
Lymphs Abs: 1.2 10*3/uL (ref 0.7–4.0)
Monocytes Absolute: 0.7 10*3/uL (ref 0.1–1.0)
Monocytes Relative: 8 %
Neutro Abs: 6.5 10*3/uL (ref 1.7–7.7)
Neutrophils Relative %: 74 %

## 2024-03-30 LAB — COMPREHENSIVE METABOLIC PANEL WITH GFR
ALT: 36 U/L (ref 0–44)
AST: 28 U/L (ref 15–41)
Albumin: 3.7 g/dL (ref 3.5–5.0)
Alkaline Phosphatase: 113 U/L (ref 38–126)
Anion gap: 11 (ref 5–15)
BUN: 19 mg/dL (ref 8–23)
CO2: 29 mmol/L (ref 22–32)
Calcium: 8.9 mg/dL (ref 8.9–10.3)
Chloride: 103 mmol/L (ref 98–111)
Creatinine, Ser: 2.79 mg/dL — ABNORMAL HIGH (ref 0.61–1.24)
GFR, Estimated: 23 mL/min — ABNORMAL LOW (ref >60)
Glucose, Bld: 127 mg/dL — ABNORMAL HIGH (ref 70–99)
Potassium: 3.6 mmol/L (ref 3.5–5.1)
Sodium: 143 mmol/L (ref 135–145)
Total Bilirubin: 0.7 mg/dL (ref 0.0–1.2)
Total Protein: 7.5 g/dL (ref 6.5–8.1)

## 2024-03-30 LAB — APTT: aPTT: 36 s (ref 24–36)

## 2024-03-30 LAB — CBG MONITORING, ED
Glucose-Capillary: 136 mg/dL — ABNORMAL HIGH (ref 70–99)
Glucose-Capillary: 137 mg/dL — ABNORMAL HIGH (ref 70–99)

## 2024-03-30 LAB — PROTIME-INR
INR: 1.1 (ref 0.8–1.2)
Prothrombin Time: 14.4 s (ref 11.4–15.2)

## 2024-03-30 LAB — ETHANOL: Alcohol, Ethyl (B): <15 mg/dL (ref <15)

## 2024-03-30 MED ORDER — ASPIRIN 81 MG PO TBEC
81.0000 mg | DELAYED_RELEASE_TABLET | Freq: Every day | ORAL | Status: DC
  Administered 2024-03-31 (×2): 81 mg via ORAL
  Filled 2024-03-30 (×2): qty 1

## 2024-03-30 MED ORDER — PANTOPRAZOLE SODIUM 40 MG PO TBEC
40.0000 mg | DELAYED_RELEASE_TABLET | Freq: Every day | ORAL | Status: DC
  Administered 2024-03-31 (×2): 40 mg via ORAL
  Filled 2024-03-30 (×2): qty 1

## 2024-03-30 MED ORDER — HEPARIN SODIUM (PORCINE) 5000 UNIT/ML IJ SOLN
5000.0000 [IU] | Freq: Three times a day (TID) | INTRAMUSCULAR | Status: DC
  Administered 2024-03-31 – 2024-04-01 (×4): 5000 [IU] via SUBCUTANEOUS
  Filled 2024-03-30 (×5): qty 1

## 2024-03-30 MED ORDER — ACETAMINOPHEN 500 MG PO TABS: 1000.0000 mg | ORAL_TABLET | Freq: Four times a day (QID) | ORAL | Status: DC | PRN

## 2024-03-30 MED ORDER — LEVOTHYROXINE SODIUM 112 MCG PO TABS
112.0000 ug | ORAL_TABLET | Freq: Every day | ORAL | Status: DC
  Administered 2024-03-31 – 2024-04-01 (×2): 112 ug via ORAL
  Filled 2024-03-30 (×2): qty 1

## 2024-03-30 MED ORDER — SODIUM CHLORIDE 0.9% FLUSH: 3.0000 mL | Freq: Once | INTRAVENOUS | Status: DC

## 2024-03-30 MED ORDER — GADOBUTROL 1 MMOL/ML IV SOLN
9.0000 mL | Freq: Once | INTRAVENOUS | Status: AC | PRN
  Administered 2024-03-30: 9 mL via INTRAVENOUS

## 2024-03-30 MED ORDER — ONDANSETRON 4 MG PO TBDP: 4.0000 mg | ORAL_TABLET | Freq: Four times a day (QID) | ORAL | Status: DC | PRN

## 2024-03-30 MED ORDER — CALCITRIOL 0.25 MCG PO CAPS
0.2500 ug | ORAL_CAPSULE | Freq: Every day | ORAL | Status: DC
  Administered 2024-03-31 – 2024-04-01 (×3): 0.25 ug via ORAL
  Filled 2024-03-30 (×3): qty 1

## 2024-03-30 MED ORDER — STROKE: EARLY STAGES OF RECOVERY BOOK: Freq: Once | Status: AC

## 2024-03-30 MED ORDER — ROSUVASTATIN CALCIUM 20 MG PO TABS
20.0000 mg | ORAL_TABLET | Freq: Every day | ORAL | Status: DC
  Administered 2024-03-31 (×2): 20 mg via ORAL
  Filled 2024-03-30 (×2): qty 1

## 2024-03-30 NOTE — ED Triage Notes (Signed)
 Pt arrived via POV from home. Per Pt, he has been having left arm numbness as well as weakness and hearing loss in the left ear that started at 1700. Pt sts that the hearing has resolved but the weakness and numbness has not.

## 2024-03-30 NOTE — ED Notes (Signed)
Code  stroke  called  to  [facility] 

## 2024-03-30 NOTE — Progress Notes (Addendum)
 Telestroke RN note:  Code stroke activated at 1807. Pt currently in CT. LKWT 1700, left arm weakness and numbness with hearing loss in left ear. Pt back to room at 1817. [facility] paged per Dr. Reuel Castle request at 1816. Dr. Jeane Miguel joined tele-neuro cart for exam at 1821. No TNK indicated at this time per Dr. Jeane Miguel. Low suspicion for LVO per Dr. Jeane Miguel.

## 2024-03-30 NOTE — Consult Note (Signed)
 [facility] TeleNeurology Consult Services   Patient Name:   Tyrone Moore, Tyrone Moore Date of Birth:   April 16, 1951 Date of Service:   03/30/2024 18:16:23  Diagnosis:       I63.89 - Cerebrovascular accident (CVA) due to other mechanism [facility])  Impression:      73 y/o man with HTN, HLP, prior stroke without residual deficits presented with L arm numbness/weakness and reduced hearing in the L ear. NIHSS 1 symptoms too mild to warrant TNK. Admit for stroke workup.  - MRI brain  - MRA H/N  - TTE  - Stroke labs  - Bolus with Clopidogrel  300 mg bolus x1 and initiate dual antiplatelet therapy with Aspirin  81 mg daily and Clopidogrel  75 mg daily  Our recommendations are outlined below.  Recommendations:        Stroke/Telemetry Floor       Neuro Checks (Q2)       Bedside Swallow Eval       DVT Prophylaxis       IV Fluids, Normal Saline       Head of Bed 30 Degrees       Euglycemia and Avoid Hyperthermia (PRN Acetaminophen )       Bolus with Clopidogrel  300 mg bolus x1 and initiate dual antiplatelet therapy with Aspirin  81 mg daily and Clopidogrel  75 mg daily  Sign Out:       Discussed with Emergency Department Provider    ------------------------------------------------------------------------------  Advanced Imaging: Advanced Imaging Deferred because:  ESRD on HD. Getting MRI/MRA instead   Metrics: Last Known Well: 03/30/2024 17:00:00 Dispatch Time: 03/30/2024 18:16:23 Arrival Time: 03/30/2024 17:56:00 Initial Response Time: 03/30/2024 18:21:04 Symptoms: L arm weakness/numbness. hearing loss. Initial patient interaction: 03/30/2024 18:24:00 NIHSS Assessment Completed: 03/30/2024 18:25:54 Patient is not a candidate for Thrombolytic. Thrombolytic Medical Decision: 03/30/2024 18:28:23 Patient was not deemed candidate for Thrombolytic because of following reasons: Stroke severity too mild (non-disabling) .  CT Head: I personally reviewed all the CT images that were  available to me and it showed: no acute findings  Primary Provider Notified of Diagnostic Impression and Management Plan on: 03/30/2024 18:36:50    ------------------------------------------------------------------------------  History of Present Illness: Patient is a 73 year old Male.  Patient was brought by private transportation with symptoms of L arm weakness/numbness. hearing loss. 73 y/o man with HTN, HLP, prior stroke without residual deficits presented with L arm numbness/weakness. Had dialysis earlier this afternoon and afterwards had generalized weakness which is not unusual for him after a HD session. Then around 5pm had sudden onset L sided arm weakness/numbness and loss hearing in the left ear.   Past Medical History:      Hypertension      Hyperlipidemia      Stroke  Medications:  No Anticoagulant use  Antiplatelet use: Yes ASA Reviewed EMR for current medications  Allergies:  Reviewed  Social History: Patient Is: Married Smoking: No  Family History:  There is no family history of premature cerebrovascular disease pertinent to this consultation  ROS : 14 Points Review of Systems was performed and was negative except mentioned in HPI.  Past Surgical History: There Is No Surgical History Contributory To Today's Visit    Examination: BP(169/128), Pulse(71), 1A: Level of Consciousness - Alert; keenly responsive + 0 1B: Ask Month and Age - Both Questions Right + 0 1C: Blink Eyes & Squeeze Hands - Performs Both Tasks + 0 2: Test Horizontal Extraocular Movements - Normal + 0 3: Test Visual Fields - No Visual Loss +  0 4: Test Facial Palsy (Use Grimace if Obtunded) - Normal symmetry + 0 5A: Test Left Arm Motor Drift - No Drift for 10 Seconds + 0 5B: Test Right Arm Motor Drift - No Drift for 10 Seconds + 0 6A: Test Left Leg Motor Drift - No Drift for 5 Seconds + 0 6B: Test Right Leg Motor Drift - No Drift for 5 Seconds + 0 7: Test Limb Ataxia  (FNF/Heel-Shin) - No Ataxia + 0 8: Test Sensation - Mild-Moderate Loss: Less Sharp/More Dull + 1 9: Test Language/Aphasia - Normal; No aphasia + 0 10: Test Dysarthria - Normal + 0 11: Test Extinction/Inattention - No abnormality + 0  NIHSS Score: 1   Pre-Morbid Modified Rankin Scale: 0 Points = No symptoms at all  Spoke with : Dr. Alejo Amsler  This consult was conducted in real time using interactive audio and Immunologist. Patient was informed of the technology being used for this visit and agreed to proceed. Patient located in hospital and provider located at home/office setting.   Patient is being evaluated for possible acute neurologic impairment and high probability of imminent or life-threatening deterioration. I spent total of 35 minutes providing care to this patient, including time for face to face visit via telemedicine, review of medical records, imaging studies and discussion of findings with providers, the patient and/or family.   Dr Caroll Cipro   [facility] For Inpatient follow-up with [facility] physician please call [facility] at [phone]. As we are not an outpatient service for any post hospital discharge needs please contact the hospital for assistance. If you have any questions for the [facility] physicians or need to reconsult for clinical or diagnostic changes please contact us  via [facility] at [phone].

## 2024-03-30 NOTE — Group Note (Deleted)
 Date:  03/30/2024 Time:  9:45 PM  Group Topic/Focus:  Wrap-Up Group:   The focus of this group is to help patients review their daily goal of treatment and discuss progress on daily workbooks.     Participation Level:  {BHH PARTICIPATION EAVWU:98119}  Participation Quality:  {BHH PARTICIPATION QUALITY:22265}  Affect:  {BHH AFFECT:22266}  Cognitive:  {BHH COGNITIVE:22267}  Insight: {BHH Insight2:20797}  Engagement in Group:  {BHH ENGAGEMENT IN JYNWG:95621}  Modes of Intervention:  {BHH MODES OF INTERVENTION:22269}  Additional Comments:  ***  Maglione,Preciosa Bundrick E 03/30/2024, 9:45 PM

## 2024-03-30 NOTE — H&P (Signed)
 History and Physical    Patient: Tyrone Moore:[id number] DOB: [birth year] DOA: 03/30/2024 DOS: the patient was seen and examined on 03/30/2024 PCP: Sheron Dixons, MD  Patient coming from: Home  Chief Complaint:  Chief Complaint  Patient presents with   Weakness   HPI: Tyrone Moore is a 73 y.o. male with medical history significant of hypertension, ESRD on hemodialysis Tuesday Thursday Saturday, hyperlipidemia, current smoker, hypothyroidism presents to the emergency department for evaluation of left arm pain, numbness, and weakness and left ear hearing loss of began at about 5 PM today.  Code stroke was called prior to arrival, however on arrival his symptoms had resolved other than the left arm numbness and pain which he reported was improving. He has no other complaints other than chronic dizziness associated with dialysis sessions.  In the emergency department he is able to ambulate without difficulty.  Initial CT head was unremarkable for any acute intracranial abnormality.  Will admit for completion of stroke workup. Review of Systems: Review of Systems  Constitutional:  Negative for fever, malaise/fatigue and weight loss.  HENT:  Positive for hearing loss.   Eyes:  Negative for blurred vision and double vision.  Respiratory:  Negative for cough and sputum production.   Cardiovascular:  Negative for chest pain, palpitations and leg swelling.  Gastrointestinal:  Negative for abdominal pain, constipation, diarrhea, nausea and vomiting.  Genitourinary:  Negative for dysuria and frequency.  Musculoskeletal:  Negative for back pain.  Skin:  Negative for rash.  Neurological:  Positive for tingling, sensory change and focal weakness. Negative for tremors, seizures and loss of consciousness.    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   Chronic bilateral low back pain without sciatica 03/26/2021   S/p 4 lumbar spine surgeries   Chronic kidney disease (CKD), stage V (HCC)    Chronic obstructive pulmonary disease, unspecified (HCC) 07/11/2021   no inhalers   COVID-19 virus infection 08/31/2021   Cyst of kidney, acquired    Essential hypertension 03/26/2021   GERD (gastroesophageal reflux disease)    Hyperlipidemia    Hyperparathyroidism (HCC)    Hypertension    Mixed hyperlipidemia 03/26/2021   Pre-diabetes    Proteinuria    Thyroid  disease    Tobacco use disorder    Past Surgical History:  Procedure Laterality Date   AV FISTULA PLACEMENT Left 05/01/2023   Procedure: ARTERIOVENOUS (AV) FISTULA CREATION ( RADIOCEPHALIC);  Surgeon: Celso College, MD;  Location: [facility] ORS;  Service: Vascular;  Laterality: Left;   CYSTOSCOPY W/ RETROGRADES Bilateral 07/03/2021   Procedure: CYSTOSCOPY WITH RETROGRADE PYELOGRAM;  Surgeon: Dustin Gimenez, MD;  Location: [facility] ORS;  Service: Urology;  Laterality: Bilateral;   CYSTOSCOPY WITH LITHOLAPAXY N/A 07/03/2021   Procedure: CYSTOSCOPY WITH LITHOLAPAXY;  Surgeon: Dustin Gimenez, MD;  Location: [facility] ORS;  Service: Urology;  Laterality: N/A;   HOLEP-LASER ENUCLEATION OF THE PROSTATE WITH MORCELLATION N/A 07/03/2021   Procedure: HOLEP-LASER ENUCLEATION OF THE PROSTATE WITH MORCELLATION;  Surgeon: Dustin Gimenez, MD;  Location: [facility] ORS;  Service: Urology;  Laterality: N/A;   SPINE SURGERY  2015   X 4 surgery - lumbar disc   Social History:  reports that he has been smoking cigarettes. He has a 54 pack-year smoking history. He has been exposed to tobacco smoke. He has never used smokeless tobacco. He  reports that he does not currently use alcohol. He reports that he does not use drugs.  No Known Allergies  Family History  Problem Relation Age of Onset   Hypertension Mother    Heart disease Mother    Heart disease Sister    Hypertension Sister     Prostate cancer Neg Hx    Bladder Cancer Neg Hx    Kidney cancer Neg Hx     Prior to Admission medications   Medication Sig Start Date End Date Taking? Authorizing Provider  acetaminophen  (TYLENOL ) 500 MG tablet Take 1,000 mg by mouth every 6 (six) hours as needed for moderate pain.   Yes [provider]  amLODipine (NORVASC) 2.5 MG tablet Take 2.5 mg by mouth at bedtime.   Yes [provider]  aspirin  EC 81 MG EC tablet Take 1 tablet (81 mg total) by mouth daily. Swallow whole. Patient taking differently: Take 81 mg by mouth at bedtime. Swallow whole. 09/02/21  Yes Darus Engels A, DO  calcitRIOL (ROCALTROL) 0.25 MCG capsule Take 0.25 mcg by mouth at bedtime. 04/02/23 04/01/24 Yes [provider]  carvedilol  (COREG ) 25 MG tablet TAKE 1 TABLET BY MOUTH TWICE DAILY WITH A MEAL 01/01/24  Yes Sheron Dixons, MD  Cholecalciferol  (D3-1000 PO) Take 2,000 Units by mouth at bedtime.   Yes [provider]  ferrous sulfate 325 (65 FE) MG tablet Take 325 mg by mouth at bedtime.   Yes [provider]  levothyroxine  (EUTHYROX ) 112 MCG tablet Take 1 tablet (112 mcg total) by mouth daily before breakfast. 11/19/23  Yes Sheron Dixons, MD  lidocaine -prilocaine (EMLA) cream Apply 1 Application topically as directed. 07/22/23  Yes [provider]  Multiple Vitamins-Minerals (MULTIVITAL-M PO) Take 1 tablet by mouth at bedtime.   Yes [provider]  omeprazole  (PRILOSEC) 20 MG capsule Take 1 capsule by mouth once daily Patient taking differently: Take 20 mg by mouth at bedtime. 12/11/23  Yes Sheron Dixons, MD  ondansetron  (ZOFRAN -ODT) 4 MG disintegrating tablet Take 4 mg by mouth every 6 (six) hours as needed. 02/24/24  Yes [provider]  rosuvastatin  (CRESTOR ) 5 MG tablet Take 1 tablet by mouth once daily Patient taking differently: Take 5 mg by mouth at bedtime. 10/01/23  Yes Sheron Dixons, MD    Physical Exam: Vitals:    03/30/24 1808 03/30/24 1828 03/30/24 1900  BP: (!) 169/128  (!) 188/73  Pulse: 71  66  Resp: 20  18  Temp:  98.2 F (36.8 C)   SpO2: 94%  95%   Physical Activity: Inactive (08/27/2023)   Exercise Vital Sign    Days of Exercise per Week: 0 days    Minutes of Exercise per Session: 0 min   Physical Exam Constitutional:      General: He is not in acute distress. HENT:     Mouth/Throat:     Mouth: Mucous membranes are moist.  Eyes:     Extraocular Movements: Extraocular movements intact.     Pupils: Pupils are equal, round, and reactive to light.  Cardiovascular:     Rate and Rhythm: Normal rate and regular rhythm.  Pulmonary:     Effort: Pulmonary effort is normal. No respiratory distress.  Abdominal:     General: There is no distension.     Palpations: Abdomen is soft.     Tenderness: There is no abdominal tenderness.  Musculoskeletal:     Cervical back: Neck supple.  Neurological:  Mental Status: He is alert and oriented to person, place, and time.     Cranial Nerves: Cranial nerves 2-12 are intact.     Sensory: Sensory deficit present.     Motor: Motor function is intact.     Coordination: Coordination is intact.     Data Reviewed:   Labs on Admission: I have personally reviewed following labs and imaging studies  CBC: Recent Labs  Lab 03/30/24 1823  WBC 8.8  NEUTROABS 6.5  HGB 11.7*  HCT 36.4*  MCV 103.4*  PLT 143*   Basic Metabolic Panel: Recent Labs  Lab 03/30/24 1823  NA 143  K 3.6  CL 103  CO2 29  GLUCOSE 127*  BUN 19  CREATININE 2.79*  CALCIUM  8.9   GFR: CrCl cannot be calculated (Unknown ideal weight.). Liver Function Tests: Recent Labs  Lab 03/30/24 1823  AST 28  ALT 36  ALKPHOS 113  BILITOT 0.7  PROT 7.5  ALBUMIN 3.7   No results for input(s): "LIPASE", "AMYLASE" in the last 168 hours. No results for input(s): "AMMONIA" in the last 168 hours. Coagulation Profile: Recent Labs  Lab 03/30/24 1823  INR 1.1   Cardiac  Enzymes: No results for input(s): "CKTOTAL", "CKMB", "CKMBINDEX", "TROPONINI" in the last 168 hours. BNP (last 3 results) No results for input(s): "PROBNP" in the last 8760 hours. HbA1C: No results for input(s): "HGBA1C" in the last 72 hours. CBG: Recent Labs  Lab 03/30/24 1804  GLUCAP 137*  136*   Lipid Profile: No results for input(s): "CHOL", "HDL", "LDLCALC", "TRIG", "CHOLHDL", "LDLDIRECT" in the last 72 hours. Thyroid  Function Tests: No results for input(s): "TSH", "T4TOTAL", "FREET4", "T3FREE", "THYROIDAB" in the last 72 hours. Anemia Panel: No results for input(s): "VITAMINB12", "FOLATE", "FERRITIN", "TIBC", "IRON", "RETICCTPCT" in the last 72 hours. Urine analysis:    Component Value Date/Time   COLORURINE YELLOW 06/29/2021 1614   APPEARANCEUR Cloudy (A) 07/10/2021 1345   LABSPEC 1.020 06/29/2021 1614   PHURINE 5.5 06/29/2021 1614   GLUCOSEU Negative 07/10/2021 1345   HGBUR TRACE (A) 06/29/2021 1614   BILIRUBINUR Negative 07/10/2021 1345   KETONESUR NEGATIVE 06/29/2021 1614   PROTEINUR 3+ (A) 07/10/2021 1345   PROTEINUR 100 (A) 06/29/2021 1614   UROBILINOGEN 0.2 03/26/2021 1433   NITRITE Negative 07/10/2021 1345   NITRITE NEGATIVE 06/29/2021 1614   LEUKOCYTESUR 1+ (A) 07/10/2021 1345   LEUKOCYTESUR NEGATIVE 06/29/2021 1614    Radiological Exams on Admission: CT HEAD CODE STROKE WO CONTRAST Result Date: 03/30/2024 CLINICAL DATA:  Code stroke. Provided history: Neuro deficit, acute, stroke suspected. Left arm numbness. History of TIA. EXAM: CT HEAD WITHOUT CONTRAST TECHNIQUE: Contiguous axial images were obtained from the base of the skull through the vertex without intravenous contrast. RADIATION DOSE REDUCTION: This exam was performed according to the departmental dose-optimization program which includes automated exposure control, adjustment of the mA and/or kV according to patient size and/or use of iterative reconstruction technique. COMPARISON:  Brain MRI  08/31/2021. FINDINGS: Brain: No age-advanced or lobar predominant cerebral atrophy. There is no acute intracranial hemorrhage. No demarcated cortical infarct. No extra-axial fluid collection. No evidence of an intracranial mass. No midline shift. Vascular: No hyperdense vessel.  Atherosclerotic calcifications. Skull: No calvarial fracture or aggressive osseous lesion. Sinuses/Orbits: No mass or acute finding within the imaged orbits. Minimal mucosal thickening within the right maxillary sinus at the imaged levels. ASPECTS [facility] Stroke Program Early CT Score) - Ganglionic level infarction (caudate, lentiform nuclei, internal capsule, insula, M1-M3 cortex): 7 - Supraganglionic infarction (  M4-M6 cortex): 3 Total score (0-10 with 10 being normal): 10 No acute intracranial finding. These results were communicated to Dr. Doretta Gant At 6:32 pmon 5/20/2025by text page via the [facility] messaging system. IMPRESSION: No acute intracranial finding. ASPECTS is 10. Electronically Signed   By: Bascom Lily D.O.   On: 03/30/2024 18:33       Assessment and Plan: No notes have been filed under this hospital service. Service: Hospitalist  73 year old male with ESRD on hemodialysis Tuesday Thursday Friday, hypertension, current smoker, hyperlipidemia presents emergency department as a code stroke for left upper extremity pain, paresthesias, weakness and sudden left-sided hearing loss.  Symptoms resolved prior to arrival.  Admitted for stroke workup  Strokelike symptom - CVA workup - Initial CT head without acute intracranial abnormality.  Pending MRI brain, MRA head and neck, TEE - monitor on telemetry overnight  - Status post 300 mg of clopidogrel , will continue with dual antiplatelet therapy - increase crestor  to 20mg  for high intensity  - Teleneurology consult appreciated - PT OT - Allow permissive hypertension for now  ESRD on HD T/R/S - Received dialysis today.  - cont calcitriol  Hypertension - home  medications held allowing for permissive hypertension  HLD  - - increase crestor  to 20mg  for high intensity   Hypothyroidism  - Cont LT4, check TSH   Current smoker - Declined nicotine  patch, encourage sensation  Heparin  Heart healthy diet No IVF  Monitor/replace electrolytes  Advance Care Planning:   Code Status: Full Code discussed with patient time of admission  Consults: Tele-neurology   Family Communication: son present at bedside, all questions answered   Severity of Illness: The appropriate patient status for this patient is OBSERVATION. Observation status is judged to be reasonable and necessary in order to provide the required intensity of service to ensure the patient's safety. The patient's presenting symptoms, physical exam findings, and initial radiographic and laboratory data in the context of their medical condition is felt to place them at decreased risk for further clinical deterioration. Furthermore, it is anticipated that the patient will be medically stable for discharge from the hospital within 2 midnights of admission.   Author: Charlesetta Connors, DO 03/30/2024 11:32 PM  For on call review [email or web address].

## 2024-03-30 NOTE — Progress Notes (Signed)
 Pt was returning from CT. No needs shared at this time. Pt aware how to get in touch with chaplain services should needs arise

## 2024-03-30 NOTE — ED Triage Notes (Signed)
 Pt to ED for L arm numbness since 1705. CBG 137. Hx TIA. L arm restricted, had dialysis today (full). Speech clear.

## 2024-03-30 NOTE — ED Notes (Signed)
 Patient states that he feels back to normal.  He reports no difference in sensation from left to right, and is experiencing no weakness.  He says that his hearing went away when he first had symptoms on the left side.  He says he can hear now out of his left ear, but it's not completely back to normal.  Dr. Alejo Amsler has been informed.

## 2024-03-30 NOTE — ED Provider Notes (Signed)
 [facility] Provider Note   Event Date/Time   First MD Initiated Contact with Patient 03/30/24 1808     (approximate) History  Weakness  HPI Tyrone Moore is a 73 y.o. male with a past medical history of hyperlipidemia, hypothyroidism, hypertension, and tobacco use disorder with COPD who presents complaining of left arm numbness and weakness as well as hearing loss in the left ear that began at approximately 1700 today.  Patient states that the hearing has resolved as well as the weakness but the numbness is still present.  Patient states he does have history of TIA.  Code stroke called prior to arrival ROS: Patient currently denies any vision changes, tinnitus, difficulty speaking, facial droop, sore throat, chest pain, shortness of breath, abdominal pain, nausea/vomiting/diarrhea, dysuria   Physical Exam  Triage Vital Signs: ED Triage Vitals  Encounter Vitals Group     BP 03/30/24 1808 (!) 169/128     Systolic BP Percentile --      Diastolic BP Percentile --      Pulse Rate 03/30/24 1808 71     Resp 03/30/24 1808 20     Temp 03/30/24 1828 98.2 F (36.8 C)     Temp src --      SpO2 03/30/24 1808 94 %     Weight --      Height --      Head Circumference --      Peak Flow --      Pain Score --      Pain Loc --      Pain Education --      Exclude from Growth Chart --    Most recent vital signs: Vitals:   03/30/24 1828 03/30/24 1900  BP:  (!) 188/73  Pulse:  66  Resp:  18  Temp: 98.2 F (36.8 C)   SpO2:  95%   General: Awake, oriented x4. CV:  Good peripheral perfusion.  Resp:  Normal effort.  Abd:  No distention.  Other:  Elderly obese Caucasian male resting comfortably in no acute distress.  Decreased sensation to left upper extremity ED Results / Procedures / Treatments  Labs (all labs ordered are listed, but only abnormal results are displayed) Labs Reviewed  CBC - Abnormal; Notable for the following components:      Result Value   RBC  3.52 (*)    Hemoglobin 11.7 (*)    HCT 36.4 (*)    MCV 103.4 (*)    Platelets 143 (*)    All other components within normal limits  COMPREHENSIVE METABOLIC PANEL WITH GFR - Abnormal; Notable for the following components:   Glucose, Bld 127 (*)    Creatinine, Ser 2.79 (*)    GFR, Estimated 23 (*)    All other components within normal limits  CBG MONITORING, ED - Abnormal; Notable for the following components:   Glucose-Capillary 136 (*)    All other components within normal limits  CBG MONITORING, ED - Abnormal; Notable for the following components:   Glucose-Capillary 137 (*)    All other components within normal limits  PROTIME-INR  APTT  DIFFERENTIAL  ETHANOL  I-STAT CREATININE, ED  RADIOLOGY ED MD interpretation: CT of the head without contrast interpreted by me shows no evidence of acute abnormalities including no intracerebral hemorrhage, obvious masses, or significant edema -Agree with radiology assessment Official radiology report(s): CT HEAD CODE STROKE WO CONTRAST Result Date: 03/30/2024 CLINICAL DATA:  Code stroke. Provided history: Neuro deficit, acute,  stroke suspected. Left arm numbness. History of TIA. EXAM: CT HEAD WITHOUT CONTRAST TECHNIQUE: Contiguous axial images were obtained from the base of the skull through the vertex without intravenous contrast. RADIATION DOSE REDUCTION: This exam was performed according to the departmental dose-optimization program which includes automated exposure control, adjustment of the mA and/or kV according to patient size and/or use of iterative reconstruction technique. COMPARISON:  Brain MRI 08/31/2021. FINDINGS: Brain: No age-advanced or lobar predominant cerebral atrophy. There is no acute intracranial hemorrhage. No demarcated cortical infarct. No extra-axial fluid collection. No evidence of an intracranial mass. No midline shift. Vascular: No hyperdense vessel.  Atherosclerotic calcifications. Skull: No calvarial fracture or  aggressive osseous lesion. Sinuses/Orbits: No mass or acute finding within the imaged orbits. Minimal mucosal thickening within the right maxillary sinus at the imaged levels. ASPECTS [facility] Stroke Program Early CT Score) - Ganglionic level infarction (caudate, lentiform nuclei, internal capsule, insula, M1-M3 cortex): 7 - Supraganglionic infarction (M4-M6 cortex): 3 Total score (0-10 with 10 being normal): 10 No acute intracranial finding. These results were communicated to Dr. Doretta Gant At 6:32 pmon 5/20/2025by text page via the [facility] messaging system. IMPRESSION: No acute intracranial finding. ASPECTS is 10. Electronically Signed   By: Bascom Lily D.O.   On: 03/30/2024 18:33   PROCEDURES: Critical Care performed: Yes, see critical care procedure note(s) .1-3 Lead EKG Interpretation  Performed by: Charleen Conn, MD Authorized by: Charleen Conn, MD     Interpretation: normal     ECG rate:  71   ECG rate assessment: normal     Rhythm: sinus rhythm     Ectopy: none     Conduction: normal    MEDICATIONS ORDERED IN ED: Medications  sodium chloride  flush (NS) 0.9 % injection 3 mL (3 mLs Intravenous Not Given 03/30/24 1909)   IMPRESSION / MDM / ASSESSMENT AND PLAN / ED COURSE  I reviewed the triage vital signs and the nursing notes.                             The patient is on the cardiac monitor to evaluate for evidence of arrhythmia and/or significant heart rate changes. Patient's presentation is most consistent with acute presentation with potential threat to life or bodily function. Stroke alert PMH risk factors: Tobacco abuse, hypertension, hyperlipidemia Neurologic Deficits: Left hearing loss, left upper extremity numbness/weakness Last known Well Time: 1700 NIH Stroke Score: 1 Given History and Exam I have lower suspicion for infectious etiology, neurologic changes secondary to toxicologic ingestion, seizure, complex migraine. Presentation concerning for possible stroke requiring  workup.  Workup: Labs: POC glucose, CBC, BMP, LFTs, Troponin, PT/INR, PTT, Type and Screen Other Diagnostics: ECG, CXR, non-contrast head CT followed by CTA brain and neck (to r/o large vessel occlusion amenable to thrombectomy) Interventions: Patient low on NIH scale and out of the window for tpa.  Consult: Neurology. Discussed with Dr. Jeane Miguel regarding patient's neurological symptoms and last well-known time and eligibility for TPA criteria. Disposition: Admission to medicine.   FINAL CLINICAL IMPRESSION(S) / ED DIAGNOSES   Final diagnoses:  Weakness of left upper extremity  Left upper extremity numbness   Rx / DC Orders   ED Discharge Orders     None      Note:  This document was prepared using Dragon voice recognition software and may include unintentional dictation errors.   Rosco Harriott K, MD 03/30/24 [phone]

## 2024-03-31 ENCOUNTER — Observation Stay

## 2024-03-31 ENCOUNTER — Observation Stay
Admit: 2024-03-31 | Discharge: 2024-03-31 | Disposition: A | Discharge status: Home / Self Care | Attending: Internal Medicine | Admitting: Internal Medicine

## 2024-03-31 DIAGNOSIS — R9082 White matter disease, unspecified: Secondary | ICD-10-CM | POA: Diagnosis not present

## 2024-03-31 DIAGNOSIS — I639 Cerebral infarction, unspecified: Secondary | ICD-10-CM | POA: Diagnosis not present

## 2024-03-31 LAB — CBC
HCT: 35.3 % — ABNORMAL LOW (ref 39.0–52.0)
Hemoglobin: 11.3 g/dL — ABNORMAL LOW (ref 13.0–17.0)
MCH: 33.3 pg (ref 26.0–34.0)
MCHC: 32 g/dL (ref 30.0–36.0)
MCV: 104.1 fL — ABNORMAL HIGH (ref 80.0–100.0)
Platelets: 130 10*3/uL — ABNORMAL LOW (ref 150–400)
RBC: 3.39 MIL/uL — ABNORMAL LOW (ref 4.22–5.81)
RDW: 13 % (ref 11.5–15.5)
WBC: 6.8 10*3/uL (ref 4.0–10.5)
nRBC: 0 % (ref 0.0–0.2)

## 2024-03-31 LAB — BASIC METABOLIC PANEL WITH GFR
Anion gap: 7 (ref 5–15)
BUN: 21 mg/dL (ref 8–23)
CO2: 32 mmol/L (ref 22–32)
Calcium: 9.1 mg/dL (ref 8.9–10.3)
Chloride: 104 mmol/L (ref 98–111)
Creatinine, Ser: 3.39 mg/dL — ABNORMAL HIGH (ref 0.61–1.24)
GFR, Estimated: 18 mL/min — ABNORMAL LOW (ref >60)
Glucose, Bld: 90 mg/dL (ref 70–99)
Potassium: 3.9 mmol/L (ref 3.5–5.1)
Sodium: 143 mmol/L (ref 135–145)

## 2024-03-31 LAB — LIPID PANEL
Cholesterol: 140 mg/dL (ref 0–200)
HDL: 36 mg/dL — ABNORMAL LOW (ref >40)
LDL Cholesterol: 78 mg/dL (ref 0–99)
Total CHOL/HDL Ratio: 3.9 ratio
Triglycerides: 129 mg/dL (ref <150)
VLDL: 26 mg/dL (ref 0–40)

## 2024-03-31 LAB — FOLATE: Folate: 33 ng/mL (ref >5.9)

## 2024-03-31 LAB — FERRITIN: Ferritin: 267 ng/mL (ref 24–336)

## 2024-03-31 LAB — TSH: TSH: 2.067 u[IU]/mL (ref 0.350–4.500)

## 2024-03-31 LAB — IRON AND TIBC
Iron: 113 ug/dL (ref 45–182)
Saturation Ratios: 35 % (ref 17.9–39.5)
TIBC: 321 ug/dL (ref 250–450)
UIBC: 208 ug/dL

## 2024-03-31 MED ORDER — CHLORHEXIDINE GLUCONATE CLOTH 2 % EX PADS
6.0000 | MEDICATED_PAD | Freq: Every day | CUTANEOUS | Status: DC
  Administered 2024-04-01: 6 via TOPICAL

## 2024-03-31 MED ORDER — CARVEDILOL 25 MG PO TABS
25.0000 mg | ORAL_TABLET | Freq: Two times a day (BID) | ORAL | Status: DC
  Administered 2024-03-31 – 2024-04-01 (×2): 25 mg via ORAL
  Filled 2024-03-31 (×2): qty 1

## 2024-03-31 MED ORDER — CLOPIDOGREL BISULFATE 75 MG PO TABS
75.0000 mg | ORAL_TABLET | Freq: Every day | ORAL | Status: DC
  Administered 2024-03-31 – 2024-04-01 (×2): 75 mg via ORAL
  Filled 2024-03-31 (×2): qty 1

## 2024-03-31 MED ORDER — AMLODIPINE BESYLATE 5 MG PO TABS
5.0000 mg | ORAL_TABLET | Freq: Every day | ORAL | Status: DC
  Administered 2024-03-31: 5 mg via ORAL
  Filled 2024-03-31: qty 1

## 2024-03-31 NOTE — Progress Notes (Signed)
  Echocardiogram 2D Echocardiogram has been performed.  Dione Franks 03/31/2024, 4:48 PM

## 2024-03-31 NOTE — Telephone Encounter (Signed)
Medication refil

## 2024-03-31 NOTE — Care Management Obs Status (Signed)
 MEDICARE OBSERVATION STATUS NOTIFICATION   Patient Details  Name: Tyrone Moore MRN: [id number] Date of Birth: [birth year]   Medicare Observation Status Notification Given:  Yes    Koa Zoeller W, CMA 03/31/2024, 2:32 PM

## 2024-03-31 NOTE — Evaluation (Signed)
 Occupational Therapy Evaluation Patient Details Name: Tyrone Moore MRN: [id number] DOB: 05/09/1951 Today's Date: 03/31/2024   History of Present Illness   72yoM, here 5/20 after acute onset LUE numbness, weakness, Left hearing loss. HD patient for ~3 months with LUE fistula. At time of PT evaluation, hearing deficit resolved.  Pt recently moved back in with Son/SIL in Jan 2025 after decline in health. Pt reports 2 falls since then, both related to dizziness. Pt reports persistent dizziness episodes since starting HD.     Clinical Impressions Pt was seen for OT evaluation this date. PTA, pt was IND, driving himself to dialysis, etc. No AD use. Pt currently reports being at his baseline with ADLs and mobility. He has been ambulating in the room and to the bathroom independently. He continues to have L sided hearing loss and LUE paraesthesias, that is not affecting his ability to detect light touch or objects in his hand with eyes closed. Able to don/doff socks without issue and manage all aspects of toileting without issue. Edu son and pt on pacing, ECS, and use of DME/AE/AD for modification of tasks to prevent falls from dizziness and overexertion. Pt verbalized understanding and is anxious to get back home. No OT needs will sign off in house. Do not anticipate the need for follow up OT services upon acute hospital DC.      If plan is discharge home, recommend the following:         Functional Status Assessment   Patient has not had a recent decline in their functional status     Equipment Recommendations   None recommended by OT     Recommendations for Other Services         Precautions/Restrictions   Precautions Precautions: Fall Recall of Precautions/Restrictions: Intact Restrictions Weight Bearing Restrictions Per Provider Order: No     Mobility Bed Mobility               General bed mobility comments: seated EOB on entry    Transfers Overall  transfer level: Modified independent Equipment used: None                      Balance Overall balance assessment: History of Falls, Needs assistance Sitting-balance support: Feet supported Sitting balance-Leahy Scale: Normal       Standing balance-Leahy Scale: Good                             ADL either performed or assessed with clinical judgement   ADL Overall ADL's : At baseline                                       General ADL Comments: able to don/doff sock without issues; has been up going to the bathroom and reports being at baseline     Vision         Perception         Praxis         Pertinent Vitals/Pain Pain Assessment Pain Assessment: No/denies pain     Extremity/Trunk Assessment Upper Extremity Assessment Upper Extremity Assessment: Overall WFL for tasks assessed;Right hand dominant;LUE deficits/detail LUE Deficits / Details: LUE paresthesia--able to identify light touch throughout full UE and identify objects in hand without issue   Lower Extremity Assessment Lower Extremity Assessment: Defer to PT evaluation  Communication Communication Communication: Impaired (L sided hearing loss)   Cognition Arousal: Alert Behavior During Therapy: WFL for tasks assessed/performed                                 Following commands: Intact       Cueing  General Comments   Cueing Techniques: Verbal cues      Exercises Other Exercises Other Exercises: Edu on role of OT, pacing and ECS during transfers, mobility and ADL performance.   Shoulder Instructions      Home Living Family/patient expects to be discharged to:: Private residence Living Arrangements: Children (lives with son, DIL) Available Help at Discharge: Family Type of Home: House Home Access: Stairs to enter Secretary/administrator of Steps: 3-4   Home Layout:  (lives on main level)               Home Equipment: None           Prior Functioning/Environment Prior Level of Function : Independent/Modified Independent;Driving             Mobility Comments: pt drives self to HD, reports able to navigate home entyr stairs after HD sessions without difficulty. ADLs Comments: independent    OT Problem List:     OT Treatment/Interventions:        OT Goals(Current goals can be found in the care plan section)       OT Frequency:       Co-evaluation              AM-PAC OT "6 Clicks" Daily Activity     Outcome Measure Help from another person eating meals?: None Help from another person taking care of personal grooming?: None Help from another person toileting, which includes using toliet, bedpan, or urinal?: None Help from another person bathing (including washing, rinsing, drying)?: None Help from another person to put on and taking off regular upper body clothing?: None Help from another person to put on and taking off regular lower body clothing?: None 6 Click Score: 24   End of Session Nurse Communication: Mobility status  Activity Tolerance: Patient tolerated treatment well Patient left: in bed;with family/visitor present  OT Visit Diagnosis: Other abnormalities of gait and mobility (R26.89)                Time: 8119-1478 OT Time Calculation (min): 13 min Charges:  OT General Charges $OT Visit: 1 Visit OT Evaluation $OT Eval Low Complexity: 1 Low [name], OTR/L 03/31/24, 1:17 PM  [name] E [name] 03/31/2024, 1:13 PM

## 2024-03-31 NOTE — Evaluation (Addendum)
 Physical Therapy Evaluation Patient Details Name: Tyrone Moore MRN: [id number] DOB: 1951/05/10 Today's Date: 03/31/2024  History of Present Illness  72yoM, here 5/20 after acute onset LUE numbness, weakness, Left hearing loss. HD patient for ~3 months with LUE fistula. At time of PT evaluation, hearing deficit resolved.  Pt recently moved back in with Son/SIL in Jan 2025 after decline in health. Pt reports 2 falls since then, both related to dizziness. Pt reports persistent dizziness episodes since starting HD.  Clinical Impression  Pt reports continued LUE paresthesias, however strength and sensation are [facility] for ADL/IADL performance. Pt has been AMB since arrival, denies any acute imbalance or difficulty with AMB. Pt has a recent progression in falls at home, he screens out balance well this morning, but warrants more in-depth balance assessment as an outpatient, particularly givne his labile BP issues and persistent HTN. Will follow while admitted, but anticipate no DME needs at DC. Should see OPPT for detailed balance assessment.       If plan is discharge home, recommend the following:     Can travel by private vehicle        Equipment Recommendations None recommended by PT  Recommendations for Other Services       Functional Status Assessment Patient has not had a recent decline in their functional status     Precautions / Restrictions Precautions Precautions: Fall Restrictions Weight Bearing Restrictions Per Provider Order: No      Mobility  Bed Mobility Overal bed mobility: Modified Independent                  Transfers Overall transfer level: Modified independent Equipment used: None                    Ambulation/Gait Ambulation/Gait assistance:  (has been up to BR several times in ED, AMB at baseline per patient and per son's report. Addiitonal distance deferred due to elevated pressures.)                Stairs            Wheelchair  Mobility     Tilt Bed    Modified Rankin (Stroke Patients Only)       Balance                                 Standardized Balance Assessment Standardized Balance Assessment :  (maintains balance during eyes closed firm surface, marching in place; abnormal sway without LOB during 180 degree turns; LOB with hands-free recovery during SLS attempts)           Pertinent Vitals/Pain Pain Assessment Pain Assessment: No/denies pain    Home Living Family/patient expects to be discharged to:: Private residence Living Arrangements: Children (lives with son, DIL) Available Help at Discharge: Family Type of Home: House Home Access: Stairs to enter   Secretary/administrator of Steps: 3-4   Home Layout:  (lives on main level) Home Equipment: None      Prior Function Prior Level of Function : Independent/Modified Independent;Driving             Mobility Comments: pt drives self to HD, reports able to navigate home entyr stairs after HD sessions without difficulty. ADLs Comments: independent     Extremity/Trunk Assessment        Lower Extremity Assessment Lower Extremity Assessment: LLE deficits/detail LLE Deficits / Details: continued whole arm paresthesia; tactile localization  WNL when testing, grip strength funcitonally appropriate.       Communication        Cognition Arousal: Alert Behavior During Therapy: WFL for tasks assessed/performed   PT - Cognitive impairments: No apparent impairments                                 Cueing       General Comments      Exercises     Assessment/Plan    PT Assessment All further PT needs can be met in the next venue of care;Patient needs continued PT services  PT Problem List Decreased balance       PT Treatment Interventions Stair training;Functional mobility training;Therapeutic activities;Balance training;Gait training;Patient/family education    PT Goals (Current goals can be  found in the Care Plan section)  Acute Rehab PT Goals Patient Stated Goal: normalization of sensorium PT Goal Formulation: With patient Time For Goal Achievement: 04/14/24 Potential to Achieve Goals: Fair    Frequency Min 2X/week     Co-evaluation               AM-PAC PT "6 Clicks" Mobility  Outcome Measure Help needed turning from your back to your side while in a flat bed without using bedrails?: None Help needed moving from lying on your back to sitting on the side of a flat bed without using bedrails?: None Help needed moving to and from a bed to a chair (including a wheelchair)?: A Little Help needed standing up from a chair using your arms (e.g., wheelchair or bedside chair)?: A Little Help needed to walk in hospital room?: A Little Help needed climbing 3-5 steps with a railing? : A Little 6 Click Score: 20    End of Session   Activity Tolerance: Patient tolerated treatment well;No increased pain Patient left: in bed;with call bell/phone within reach;with family/visitor present   PT Visit Diagnosis: Unsteadiness on feet (R26.81);Other abnormalities of gait and mobility (R26.89);Other symptoms and signs involving the nervous system (R29.898)    Time: 0981-1914 PT Time Calculation (min) (ACUTE ONLY): 18 min   Charges:   PT Evaluation $PT Eval Moderate Complexity: 1 Mod   PT General Charges $$ ACUTE PT VISIT: 1 Visit       9:15 AM, 03/31/24 Dawn Eth, PT, DPT Physical Therapist - [facility]  [phone] (ASCOM)    Tyrone Moore 03/31/2024, 9:13 AM

## 2024-03-31 NOTE — ED Notes (Signed)
 Pt placed on 2L [place] while sleeping due to spo2 dropping to 87-90% when asleep.

## 2024-03-31 NOTE — Progress Notes (Addendum)
 Progress Note    Tyrone Moore  ZOX:[id number] DOB: May 23, 1951  DOA: 03/30/2024 PCP: Tyrone Dixons, MD      Brief Narrative:    Medical records reviewed and are as summarized below:  Tyrone Moore is a 73 y.o. male  with medical history significant of hypertension, ESRD on hemodialysis Tuesday Thursday Saturday, hyperlipidemia, current smoker, hypothyroidism, who presented to the ED on 03/30/2024 because of weakness and numbness in the left arm, decreased hearing in the left ear, dizziness and unsteady gait.  Symptoms started around 5 PM on 03/30/2024.  Code stroke was activated on arrival in the ED.  However, by the time he got to the ED, his symptoms had improved.  He also mentioned that he usually gets dizzy after dialysis sessions.     Assessment/Plan:   Principal Problem:   CVA (cerebral vascular accident) (HCC) Active Problems:   Mixed hyperlipidemia   Acquired hypothyroidism   Essential hypertension   Tobacco use disorder   ESRD on dialysis (HCC)   Body mass index is 28.45 kg/m.   Strokelike symptoms with weakness and numbness in the left arm, dizziness, unsteady gait and decreased hearing in the left ear: No acute abnormality on CT head.  MRI brain has been ordered for further evaluation. ?  small left ICA aneurysm versus artifact on MRA neck. Continue rosuvastatin , aspirin  and Plavix  for now. 2D echo has been ordered for further evaluation Consult to Dr. Doretta Gant, neurologist, to assist with management PT recommended outpatient PT   ESRD: He is on hemodialysis on TTS schedule.  Consulted Dr. Rhesa Celeste, nephrologist, for hemodialysis tomorrow. He reported intermittent/chronic dizziness after dialysis.   Macrocytosis: Check vitamin B12 level and iron studies   Comorbidities include hypertension, hypothyroidism, hyperlipidemia, tobacco use disorder    Diet Order             Diet Heart Room service appropriate? Yes; Fluid consistency: Thin  Diet  effective now                            Consultants: Neurologist Nephrologist  Procedures: None    Medications:    aspirin  EC  81 mg Oral QHS   calcitRIOL  0.25 mcg Oral Daily   clopidogrel   75 mg Oral Daily   heparin  injection (subcutaneous)  5,000 Units Subcutaneous Q8H   levothyroxine   112 mcg Oral Q0600   pantoprazole   40 mg Oral QHS   rosuvastatin   20 mg Oral QHS   sodium chloride  flush  3 mL Intravenous Once   Continuous Infusions:   Anti-infectives (From admission, onward)    None              Family Communication/Anticipated D/C date and plan/Code Status   DVT prophylaxis: heparin  injection 5,000 Units Start: 03/31/24 0000     Code Status: Full Code  Family Communication: Plan discussed with Tyrone Moore, over the phone Disposition Plan: Plan to discharge home   Status is: Observation The patient will require care spanning > 2 midnights and should be moved to inpatient because: Suspected acute stroke       Subjective:   Interval events noted.  He complains of dizziness, unsteady gait and decreased hearing in the left ear.  Numbness and weakness in the left arm have improved. Nurse at the bedside  Objective:    Vitals:   03/31/24 0938 03/31/24 1100 03/31/24 1223 03/31/24 1227  BP: (!) 179/93 Aaron Aas)  178/96 (!) 174/94   Pulse: 71 64 67   Resp: 16 16 20    Temp:  98 F (36.7 C) 98.8 F (37.1 C)   TempSrc:  Oral    SpO2: 96% 95% 93%   Height:    5\' 11"  (1.803 m)   Orthostatic VS for the past 24 hrs:  BP- Lying Pulse- Lying BP- Standing at 0 minutes Pulse- Standing at 0 minutes  03/31/24 0906 182/75 72 200/74 77     Intake/Output Summary (Last 24 hours) at 03/31/2024 1521 Last data filed at 03/31/2024 1429 Gross per 24 hour  Intake 480 ml  Output --  Net 480 ml   There were no vitals filed for this visit.  Exam:  GEN: NAD SKIN: Warm and dry EYES: No pallor or icterus ENT: MMM.  Decreased hearing in  the left ear CV: RRR PULM: CTA B ABD: soft, ND, NT, +BS CNS: AAO x 3, non focal EXT: No edema or tenderness        Data Reviewed:   I have personally reviewed following labs and imaging studies:  Labs: Labs show the following:   Basic Metabolic Panel: Recent Labs  Lab 03/30/24 1823 03/31/24 0506  NA 143 143  K 3.6 3.9  CL 103 104  CO2 29 32  GLUCOSE 127* 90  BUN 19 21  CREATININE 2.79* 3.39*  CALCIUM  8.9 9.1   GFR CrCl cannot be calculated (Unknown ideal weight.). Liver Function Tests: Recent Labs  Lab 03/30/24 1823  AST 28  ALT 36  ALKPHOS 113  BILITOT 0.7  PROT 7.5  ALBUMIN 3.7   No results for input(s): "LIPASE", "AMYLASE" in the last 168 hours. No results for input(s): "AMMONIA" in the last 168 hours. Coagulation profile Recent Labs  Lab 03/30/24 1823  INR 1.1    CBC: Recent Labs  Lab 03/30/24 1823 03/31/24 0506  WBC 8.8 6.8  NEUTROABS 6.5  --   HGB 11.7* 11.3*  HCT 36.4* 35.3*  MCV 103.4* 104.1*  PLT 143* 130*   Cardiac Enzymes: No results for input(s): "CKTOTAL", "CKMB", "CKMBINDEX", "TROPONINI" in the last 168 hours. BNP (last 3 results) No results for input(s): "PROBNP" in the last 8760 hours. CBG: Recent Labs  Lab 03/30/24 1804  GLUCAP 137*  136*   D-Dimer: No results for input(s): "DDIMER" in the last 72 hours. Hgb A1c: No results for input(s): "HGBA1C" in the last 72 hours. Lipid Profile: Recent Labs    03/31/24 0506  CHOL 140  HDL 36*  LDLCALC 78  TRIG 914  CHOLHDL 3.9   Thyroid  function studies: Recent Labs    03/30/24 1823  TSH 2.067   Anemia work up: No results for input(s): "VITAMINB12", "FOLATE", "FERRITIN", "TIBC", "IRON", "RETICCTPCT" in the last 72 hours. Sepsis Labs: Recent Labs  Lab 03/30/24 1823 03/31/24 0506  WBC 8.8 6.8    Microbiology No results found for this or any previous visit (from the past 240 hours).  Procedures and diagnostic studies:  MR Angiogram Neck W or Wo  Contrast Result Date: 03/31/2024 CLINICAL DATA:  Initial evaluation for acute neuro deficit, stroke. EXAM: MRA NECK WITHOUT AND WITH CONTRAST MRA HEAD WITHOUT CONTRAST TECHNIQUE: Multiplanar and multiecho pulse sequences of the neck were obtained without and with intravenous contrast. Angiographic images of the neck were obtained using MRA technique without and with intravenous contast.; Angiographic images of the Circle of Willis were obtained using MRA technique without and with intravenous contrast. CONTRAST:  9mL GADAVIST  GADOBUTROL  1 MMOL/ML  IV SOLN COMPARISON:  Prior CT from earlier the same day as well as prior study from 08/31/2021. FINDINGS: MRA NECK FINDINGS Aortic arch: Examination severely limited by motion artifact and poor timing of the contrast bolus. Postcontrast sequences are markedly limited, with minimal contrast within the arterial system. Partially visualized aortic arch grossly within normal limits for caliber with standard branch pattern ir. No visible stenosis about the origin the great vessels. Visualized right common and internal carotid arteries are patent with antegrade flow. No visible dissection or hemodynamically significant stenosis. Right carotid system: Visualized left common and internal carotid arteries are patent with antegrade flow. No visible dissection or hemodynamically significant stenosis. Left carotid system: Visualized left common and internal carotid arteries are patent with antegrade flow. No visible dissection or hemodynamically significant stenosis. Vertebral arteries: Visualized portions of the vertebral arteries are patent with antegrade flow. No visible dissection or stenosis. Other: None. MRA HEAD FINDINGS Anterior circulation: Examination markedly degraded by motion artifact. Both internal carotid arteries are patent through the siphons without visible stenosis. Questionable 2 mm outpouching extending laterally from the cavernous left ICA (series 5, image 71).  Difficult to be certain of this finding given extensive motion artifact on this exam. A1 segments patent bilaterally. Grossly normal anterior communicating artery complex. Anterior cerebral arteries patent without visible stenosis. No M1 stenosis or occlusion. Distal MCA branches grossly perfused and symmetric. Posterior circulation: Visualized V4 segments patent without stenosis. Neither PICA origin well visualized. Basilar patent without stenosis. Superior cerebral arteries patent bilaterally. Both PCAs primarily supplied via the basilar. Focal moderate proximal right P2 stenosis (series 1047, image 7). PCAs otherwise patent to their distal aspects without proximal stenosis. Anatomic variants: None significant. Other: None. IMPRESSION: MRA HEAD: 1. Motion degraded exam. 2. Negative intracranial MRA for large vessel occlusion. 3. Focal moderate proximal right P2 stenosis. 4. Question 2 mm outpouching extending laterally from the cavernous left ICA. While this finding may be artifactual due to motion, a possible small aneurysm is difficult to exclude. Attention at follow-up recommended. MRA NECK: 1. Severely limited exam due to motion and poor timing of the contrast bolus. 2. Grossly negative MRA of the neck. No visible hemodynamically significant stenosis or other acute vascular abnormality. Electronically Signed   By: Virgia Griffins M.D.   On: 03/31/2024 00:40   MR ANGIO HEAD WO CONTRAST Result Date: 03/31/2024 CLINICAL DATA:  Initial evaluation for acute neuro deficit, stroke. EXAM: MRA NECK WITHOUT AND WITH CONTRAST MRA HEAD WITHOUT CONTRAST TECHNIQUE: Multiplanar and multiecho pulse sequences of the neck were obtained without and with intravenous contrast. Angiographic images of the neck were obtained using MRA technique without and with intravenous contast.; Angiographic images of the Circle of Willis were obtained using MRA technique without and with intravenous contrast. CONTRAST:  9mL GADAVIST   GADOBUTROL  1 MMOL/ML IV SOLN COMPARISON:  Prior CT from earlier the same day as well as prior study from 08/31/2021. FINDINGS: MRA NECK FINDINGS Aortic arch: Examination severely limited by motion artifact and poor timing of the contrast bolus. Postcontrast sequences are markedly limited, with minimal contrast within the arterial system. Partially visualized aortic arch grossly within normal limits for caliber with standard branch pattern ir. No visible stenosis about the origin the great vessels. Visualized right common and internal carotid arteries are patent with antegrade flow. No visible dissection or hemodynamically significant stenosis. Right carotid system: Visualized left common and internal carotid arteries are patent with antegrade flow. No visible dissection or hemodynamically significant stenosis. Left carotid system: Visualized  left common and internal carotid arteries are patent with antegrade flow. No visible dissection or hemodynamically significant stenosis. Vertebral arteries: Visualized portions of the vertebral arteries are patent with antegrade flow. No visible dissection or stenosis. Other: None. MRA HEAD FINDINGS Anterior circulation: Examination markedly degraded by motion artifact. Both internal carotid arteries are patent through the siphons without visible stenosis. Questionable 2 mm outpouching extending laterally from the cavernous left ICA (series 5, image 71). Difficult to be certain of this finding given extensive motion artifact on this exam. A1 segments patent bilaterally. Grossly normal anterior communicating artery complex. Anterior cerebral arteries patent without visible stenosis. No M1 stenosis or occlusion. Distal MCA branches grossly perfused and symmetric. Posterior circulation: Visualized V4 segments patent without stenosis. Neither PICA origin well visualized. Basilar patent without stenosis. Superior cerebral arteries patent bilaterally. Both PCAs primarily supplied via  the basilar. Focal moderate proximal right P2 stenosis (series 1047, image 7). PCAs otherwise patent to their distal aspects without proximal stenosis. Anatomic variants: None significant. Other: None. IMPRESSION: MRA HEAD: 1. Motion degraded exam. 2. Negative intracranial MRA for large vessel occlusion. 3. Focal moderate proximal right P2 stenosis. 4. Question 2 mm outpouching extending laterally from the cavernous left ICA. While this finding may be artifactual due to motion, a possible small aneurysm is difficult to exclude. Attention at follow-up recommended. MRA NECK: 1. Severely limited exam due to motion and poor timing of the contrast bolus. 2. Grossly negative MRA of the neck. No visible hemodynamically significant stenosis or other acute vascular abnormality. Electronically Signed   By: Virgia Griffins M.D.   On: 03/31/2024 00:40   CT HEAD CODE STROKE WO CONTRAST Result Date: 03/30/2024 CLINICAL DATA:  Code stroke. Provided history: Neuro deficit, acute, stroke suspected. Left arm numbness. History of TIA. EXAM: CT HEAD WITHOUT CONTRAST TECHNIQUE: Contiguous axial images were obtained from the base of the skull through the vertex without intravenous contrast. RADIATION DOSE REDUCTION: This exam was performed according to the departmental dose-optimization program which includes automated exposure control, adjustment of the mA and/or kV according to patient size and/or use of iterative reconstruction technique. COMPARISON:  Brain MRI 08/31/2021. FINDINGS: Brain: No age-advanced or lobar predominant cerebral atrophy. There is no acute intracranial hemorrhage. No demarcated cortical infarct. No extra-axial fluid collection. No evidence of an intracranial mass. No midline shift. Vascular: No hyperdense vessel.  Atherosclerotic calcifications. Skull: No calvarial fracture or aggressive osseous lesion. Sinuses/Orbits: No mass or acute finding within the imaged orbits. Minimal mucosal thickening within  the right maxillary sinus at the imaged levels. ASPECTS [facility] Stroke Program Early CT Score) - Ganglionic level infarction (caudate, lentiform nuclei, internal capsule, insula, M1-M3 cortex): 7 - Supraganglionic infarction (M4-M6 cortex): 3 Total score (0-10 with 10 being normal): 10 No acute intracranial finding. These results were communicated to Dr. Doretta Gant At 6:32 pmon 5/20/2025by text page via the [facility] messaging system. IMPRESSION: No acute intracranial finding. ASPECTS is 10. Electronically Signed   By: Bascom Lily D.O.   On: 03/30/2024 18:33               LOS: 0 days   Kriya Westra  [facility]   Pager on [email or web address]. If 7PM-7AM, please contact night-coverage at [email or web address]     03/31/2024, 3:21 PM

## 2024-03-31 NOTE — Telephone Encounter (Signed)
 Requested medication (s) are due for refill today: yes  Requested medication (s) are on the active medication list: yes  Last refill:  12/11/23 #180 cap  Future visit scheduled: yes  Notes to clinic:   pt currently in ED   Requested Prescriptions  Pending Prescriptions Disp Refills   omeprazole  (PRILOSEC) 20 MG capsule [Pharmacy Med Name: Omeprazole  20 MG Oral Capsule Delayed Release] 20 capsule 0    Sig: Take 1 capsule by mouth once daily     Gastroenterology: Proton Pump Inhibitors Failed - 03/31/2024  9:36 AM      Failed - Valid encounter within last 12 months    Recent Outpatient Visits   None     Future Appointments             In 1 month Gala Jubilee, Chales [place], MD [facility] at [facility], [facility], [place]

## 2024-03-31 NOTE — Plan of Care (Signed)
 Neurology plan of care  73 y/o man with HTN, HLP, prior stroke without residual deficits presented with L arm numbness/weakness and reduced hearing in the L ear. NIHSS 1 symptoms too mild to warrant TNK. He was seen as a telestroke code by telespecialists overnight.  Results of stroke workup:  MRI brain wo contrast 1. No acute intracranial abnormality. 2. Apparent diffusely increased signal intensity throughout the CSF containing spaces on FLAIR sequence. This is felt to almost certainly be artifactual in nature, although if there is concern for meningitis then correlation with CSF analysis would be recommended. No associated susceptibility artifact to suggest hemorrhage. 3. 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  [No concern for meningitis]  MRA HEAD:   1. Motion degraded exam. 2. Negative intracranial MRA for large vessel occlusion. 3. Focal moderate proximal right P2 stenosis. 4. Question 2 mm outpouching extending laterally from the cavernous left ICA. While this finding may be artifactual due to motion, a possible small aneurysm is difficult to exclude. Attention at follow-up recommended.   MRA NECK:   1. Severely limited exam due to motion and poor timing of the contrast bolus. 2. Grossly negative MRA of the neck. No visible hemodynamically significant stenosis or other acute vascular abnormality.  TTE 1. Left ventricular ejection fraction, by estimation, is 50 to 55% . The left ventricle has low normal function. The left ventricle has no regional wall motion abnormalities. The left ventricular internal cavity size was moderately dilated. Left ventricular diastolic parameters are consistent with Grade II diastolic dysfunction ( pseudonormalization) . 2. Right ventricular systolic function is normal. The right ventricular size is normal. Mildly increased right ventricular wall thickness. 3. Left atrial size was mildly  dilated. 4. Right atrial size was mildly dilated. 5. The mitral valve is normal in structure. Trivial mitral valve regurgitation. 6. The aortic valve is calcified. Aortic valve regurgitation is trivial. Mild aortic valve stenosis.   Stroke Labs     Component Value Date/Time   CHOL 140 03/31/2024 0506   CHOL 146 03/19/2023 0836   TRIG 129 03/31/2024 0506   HDL 36 (L) 03/31/2024 0506   HDL 32 (L) 03/19/2023 0836   CHOLHDL 3.9 03/31/2024 0506   VLDL 26 03/31/2024 0506   LDLCALC 78 03/31/2024 0506   LDLCALC 83 03/19/2023 0836   LABVLDL 31 03/19/2023 0836    Lab Results  Component Value Date/Time   HGBA1C 5.8 (H) 03/19/2023 08:36 AM   Assessment: Etiology of L arm numbness and weakness at presentation favored to be TIA.   Final recommendations: - ASA 81mg  daily + plavix  75mg  daily x21 days f/b plavix  75mg  daily monotherapy after that - Crestor  20mg  daily - No further inpatient neurologic workup indicated. Patient may f/u with PCP after discharge.  Neurology will be available prn for questions going forward.  Greg Leaks, MD [facility] [phone]  If 7pm- 7am, please page neurology on call as listed in AMION.

## 2024-03-31 NOTE — ED Notes (Signed)
Informed RN bed assigned 

## 2024-04-01 DIAGNOSIS — I639 Cerebral infarction, unspecified: Secondary | ICD-10-CM | POA: Diagnosis not present

## 2024-04-01 LAB — ECHOCARDIOGRAM COMPLETE
AR max vel: 1.08 cm2
AV Area VTI: 0.92 cm2
AV Area mean vel: 0.94 cm2
AV Mean grad: 20 mmHg
AV Peak grad: 37 mmHg
Ao pk vel: 3.04 m/s
Area-P 1/2: 3.72 cm2
Height: 71 in
P 1/2 time: 500 ms
S' Lateral: 4.6 cm

## 2024-04-01 LAB — HEPATITIS B SURFACE ANTIGEN: Hepatitis B Surface Ag: NONREACTIVE

## 2024-04-01 LAB — VITAMIN B12: Vitamin B-12: 535 pg/mL (ref 180–914)

## 2024-04-01 MED ORDER — ROSUVASTATIN CALCIUM 5 MG PO TABS: 20.0000 mg | ORAL_TABLET | Freq: Every day | ORAL | 0 refills | Status: DC

## 2024-04-01 MED ORDER — LIDOCAINE-PRILOCAINE 2.5-2.5 % EX CREA
1.0000 | TOPICAL_CREAM | CUTANEOUS | Status: DC | PRN
  Administered 2024-04-01: 1 via TOPICAL
  Filled 2024-04-01: qty 5

## 2024-04-01 MED ORDER — ASPIRIN 81 MG PO TBEC: 81.0000 mg | DELAYED_RELEASE_TABLET | Freq: Every evening | ORAL | Status: AC

## 2024-04-01 MED ORDER — PENTAFLUOROPROP-TETRAFLUOROETH EX AERO: 1.0000 | INHALATION_SPRAY | CUTANEOUS | Status: DC | PRN

## 2024-04-01 MED ORDER — HEPARIN SODIUM (PORCINE) 1000 UNIT/ML DIALYSIS: 1000.0000 [IU] | INTRAMUSCULAR | Status: DC | PRN

## 2024-04-01 MED ORDER — CLOPIDOGREL BISULFATE 75 MG PO TABS: 75.0000 mg | ORAL_TABLET | Freq: Every day | ORAL | 0 refills | Status: DC

## 2024-04-01 MED ORDER — ASPIRIN 81 MG PO TBEC: 81.0000 mg | DELAYED_RELEASE_TABLET | Freq: Every evening | ORAL | Status: DC

## 2024-04-01 NOTE — Plan of Care (Signed)
  Problem: Education: Goal: Knowledge of disease or condition will improve Outcome: Progressing Goal: Knowledge of secondary prevention will improve (MUST DOCUMENT ALL) Outcome: Progressing Goal: Knowledge of patient specific risk factors will improve (DELETE if not current risk factor) Outcome: Progressing   Problem: Ischemic Stroke/TIA Tissue Perfusion: Goal: Complications of ischemic stroke/TIA will be minimized Outcome: Progressing   Problem: Coping: Goal: Will verbalize positive feelings about self Outcome: Progressing Goal: Will identify appropriate support needs Outcome: Progressing   Problem: Self-Care: Goal: Ability to participate in self-care as condition permits will improve Outcome: Progressing Goal: Verbalization of feelings and concerns over difficulty with self-care will improve Outcome: Progressing Goal: Ability to communicate needs accurately will improve Outcome: Progressing   Problem: Education: Goal: Knowledge of General Education information will improve Description: Including pain rating scale, medication(s)/side effects and non-pharmacologic comfort measures Outcome: Progressing   Problem: Health Behavior/Discharge Planning: Goal: Ability to manage health-related needs will improve Outcome: Progressing   Problem: Clinical Measurements: Goal: Ability to maintain clinical measurements within normal limits will improve Outcome: Progressing Goal: Will remain free from infection Outcome: Progressing Goal: Diagnostic test results will improve Outcome: Progressing Goal: Respiratory complications will improve Outcome: Progressing Goal: Cardiovascular complication will be avoided Outcome: Progressing   Problem: Activity: Goal: Risk for activity intolerance will decrease Outcome: Progressing   Problem: Nutrition: Goal: Adequate nutrition will be maintained Outcome: Progressing   Problem: Elimination: Goal: Will not experience complications related  to bowel motility Outcome: Progressing Goal: Will not experience complications related to urinary retention Outcome: Progressing

## 2024-04-01 NOTE — Progress Notes (Signed)
 There was a consult for placing PIV access. There are no IV medication ordered. Informed patient's RN Catering manager) and Dr. Leory Rands regarding this matter. According to [employer], patient need a PIV access for further study. Recommended put in right size of needle when order the study. Dr. Leory Rands is okay to hold off on PIV access at this time. HS [employer]

## 2024-04-01 NOTE — Plan of Care (Signed)
 Problem: Education: Goal: Knowledge of disease or condition will improve 04/01/2024 0615 by Magalene Schmitt, RN Outcome: Progressing 04/01/2024 0525 by Magalene Schmitt, RN Outcome: Progressing Goal: Knowledge of secondary prevention will improve (MUST DOCUMENT ALL) 04/01/2024 0615 by Magalene Schmitt, RN Outcome: Progressing 04/01/2024 0525 by Magalene Schmitt, RN Outcome: Progressing Goal: Knowledge of patient specific risk factors will improve (DELETE if not current risk factor) 04/01/2024 0615 by Magalene Schmitt, RN Outcome: Progressing 04/01/2024 0525 by Magalene Schmitt, RN Outcome: Progressing   Problem: Ischemic Stroke/TIA Tissue Perfusion: Goal: Complications of ischemic stroke/TIA will be minimized 04/01/2024 0615 by Magalene Schmitt, RN Outcome: Progressing 04/01/2024 0525 by Magalene Schmitt, RN Outcome: Progressing   Problem: Coping: Goal: Will verbalize positive feelings about self 04/01/2024 0615 by Magalene Schmitt, RN Outcome: Progressing 04/01/2024 0525 by Magalene Schmitt, RN Outcome: Progressing Goal: Will identify appropriate support needs 04/01/2024 0615 by Magalene Schmitt, RN Outcome: Progressing 04/01/2024 0525 by Magalene Schmitt, RN Outcome: Progressing   Problem: Health Behavior/Discharge Planning: Goal: Ability to manage health-related needs will improve 04/01/2024 0615 by Magalene Schmitt, RN Outcome: Progressing 04/01/2024 0525 by Magalene Schmitt, RN Outcome: Progressing Goal: Goals will be collaboratively established with patient/family 04/01/2024 0615 by Magalene Schmitt, RN Outcome: Progressing 04/01/2024 0525 by Magalene Schmitt, RN Outcome: Progressing   Problem: Self-Care: Goal: Ability to participate in self-care as condition permits will improve 04/01/2024 0615 by Magalene Schmitt, RN Outcome: Progressing 04/01/2024 0525 by Magalene Schmitt, RN Outcome: Progressing Goal: Verbalization of feelings and  concerns over difficulty with self-care will improve 04/01/2024 0615 by Magalene Schmitt, RN Outcome: Progressing 04/01/2024 0525 by Magalene Schmitt, RN Outcome: Progressing Goal: Ability to communicate needs accurately will improve 04/01/2024 0615 by Magalene Schmitt, RN Outcome: Progressing 04/01/2024 0525 by Magalene Schmitt, RN Outcome: Progressing   Problem: Nutrition: Goal: Risk of aspiration will decrease 04/01/2024 0615 by Magalene Schmitt, RN Outcome: Progressing 04/01/2024 0525 by Magalene Schmitt, RN Outcome: Progressing Goal: Dietary intake will improve 04/01/2024 0615 by Magalene Schmitt, RN Outcome: Progressing 04/01/2024 0525 by Magalene Schmitt, RN Outcome: Progressing   Problem: Education: Goal: Knowledge of General Education information will improve Description: Including pain rating scale, medication(s)/side effects and non-pharmacologic comfort measures 04/01/2024 0615 by Magalene Schmitt, RN Outcome: Progressing 04/01/2024 0525 by Magalene Schmitt, RN Outcome: Progressing   Problem: Health Behavior/Discharge Planning: Goal: Ability to manage health-related needs will improve 04/01/2024 0615 by Magalene Schmitt, RN Outcome: Progressing 04/01/2024 0525 by Magalene Schmitt, RN Outcome: Progressing   Problem: Clinical Measurements: Goal: Ability to maintain clinical measurements within normal limits will improve 04/01/2024 0615 by Magalene Schmitt, RN Outcome: Progressing 04/01/2024 0525 by Magalene Schmitt, RN Outcome: Progressing Goal: Will remain free from infection 04/01/2024 0615 by Magalene Schmitt, RN Outcome: Progressing 04/01/2024 0525 by Magalene Schmitt, RN Outcome: Progressing Goal: Diagnostic test results will improve 04/01/2024 0615 by Magalene Schmitt, RN Outcome: Progressing 04/01/2024 0525 by Magalene Schmitt, RN Outcome: Progressing Goal: Respiratory complications will improve 04/01/2024 0615 by Magalene Schmitt, RN Outcome: Progressing 04/01/2024 0525 by Magalene Schmitt, RN Outcome: Progressing Goal: Cardiovascular complication will be avoided 04/01/2024 0615 by Magalene Schmitt, RN Outcome: Progressing 04/01/2024 0525 by Magalene Schmitt, RN Outcome: Progressing   Problem: Activity: Goal: Risk for activity intolerance will decrease 04/01/2024 0615 by Magalene Schmitt, RN Outcome: Progressing 04/01/2024 0525 by  Magalene Schmitt, RN Outcome: Progressing   Problem: Nutrition: Goal: Adequate nutrition will be maintained 04/01/2024 0615 by Magalene Schmitt, RN Outcome: Progressing 04/01/2024 0525 by Magalene Schmitt, RN Outcome: Progressing   Problem: Coping: Goal: Level of anxiety will decrease 04/01/2024 0615 by Magalene Schmitt, RN Outcome: Progressing 04/01/2024 0525 by Magalene Schmitt, RN Outcome: Progressing   Problem: Pain Managment: Goal: General experience of comfort will improve and/or be controlled 04/01/2024 0615 by Magalene Schmitt, RN Outcome: Progressing 04/01/2024 0525 by Magalene Schmitt, RN Outcome: Progressing

## 2024-04-01 NOTE — Progress Notes (Signed)
 [facility]  ROUNDING NOTE   Subjective:   Tyrone Moore is a 73 year old male with past medical conditions including tobacco abuse, hypothyroidism, hyperlipidemia, hypertension, and end-stage renal disease on hemodialysis.  Patient reports to the emergency department with complaints of weakness and has been admitted for CVA (cerebral vascular accident) (HCC) [I63.9] Weakness of left upper extremity [R29.898] Left upper extremity numbness [R20.0]  Patient is known to our practice and receives outpatient dialysis treatments at [facility] on a TTS schedule, supervised by Dr. Rhesa Celeste.  Last treatment received on Tuesday.  Patient reports left arm pain with weakness and numbness that began around 5 PM yesterday.  Also reported hearing loss in left ear.  Code stroke was called and route to emergency department.  Patient states some symptoms that resolved once he arrived to the hospital.  Denies any missed recent treatments.  Does continue to smoke.  Labs on ED arrival unremarkable for renal patient.  Head imaging negative for stroke.  We have been consulted to manage dialysis needs during this admission.   Objective:  Vital signs in last 24 hours:  Temp:  [97.4 F (36.3 C)-98.3 F (36.8 C)] 97.4 F (36.3 C) (05/22 1228) Pulse Rate:  [61-65] 62 (05/22 1228) Resp:  [16-20] 16 (05/22 1228) BP: (163-190)/(72-98) 163/72 (05/22 1228) SpO2:  [93 %-99 %] 97 % (05/22 1228)  Weight change:  There were no vitals filed for this visit.  Intake/Output: I/O last 3 completed shifts: In: 480 [P.O.:480] Out: -    Intake/Output this shift:  Total I/O In: 240 [P.O.:240] Out: -   Physical Exam: General: NAD  Head: Normocephalic, atraumatic. Moist oral mucosal membranes  Eyes: Anicteric  Lungs:  Clear to auscultation, normal effort  Heart: Regular rate and rhythm  Abdomen:  Soft, nontender, obese  Extremities: No peripheral edema.  Neurologic: Alert and oriented, moving all four  extremities  Skin: No lesions  Access: Left aVF    Basic Metabolic Panel: Recent Labs  Lab 03/30/24 1823 03/31/24 0506  NA 143 143  K 3.6 3.9  CL 103 104  CO2 29 32  GLUCOSE 127* 90  BUN 19 21  CREATININE 2.79* 3.39*  CALCIUM  8.9 9.1    Liver Function Tests: Recent Labs  Lab 03/30/24 1823  AST 28  ALT 36  ALKPHOS 113  BILITOT 0.7  PROT 7.5  ALBUMIN 3.7   No results for input(s): "LIPASE", "AMYLASE" in the last 168 hours. No results for input(s): "AMMONIA" in the last 168 hours.  CBC: Recent Labs  Lab 03/30/24 1823 03/31/24 0506  WBC 8.8 6.8  NEUTROABS 6.5  --   HGB 11.7* 11.3*  HCT 36.4* 35.3*  MCV 103.4* 104.1*  PLT 143* 130*    Cardiac Enzymes: No results for input(s): "CKTOTAL", "CKMB", "CKMBINDEX", "TROPONINI" in the last 168 hours.  BNP: Invalid input(s): "POCBNP"  CBG: Recent Labs  Lab 03/30/24 1804  GLUCAP 137*  136*    Microbiology: Results for orders placed or performed during the hospital encounter of 08/31/21  Resp Panel by RT-PCR (Flu A&B, Covid) Nasopharyngeal Swab     Status: Abnormal   Collection Time: 08/31/21 11:01 AM   Specimen: Nasopharyngeal Swab; Nasopharyngeal(NP) swabs in vial transport medium  Result Value Ref Range Status   SARS Coronavirus 2 by RT PCR POSITIVE (A) NEGATIVE Final    Comment: RESULT CALLED TO, READ BACK BY AND VERIFIED WITH: Nael ZANDESS 08/31/21 1232 KLW (NOTE) SARS-CoV-2 target nucleic acids are DETECTED.  The SARS-CoV-2 RNA  is generally detectable in upper respiratory specimens during the acute phase of infection. Positive results are indicative of the presence of the identified virus, but do not rule out bacterial infection or co-infection with other pathogens not detected by the test. Clinical correlation with patient history and other diagnostic information is necessary to determine patient infection status. The expected result is Negative.  Fact Sheet for  Patients: BloggerCourse.com  Fact Sheet for Healthcare Providers: SeriousBroker.it  This test is not yet approved or cleared by the [place]  FDA and  has been authorized for detection and/or diagnosis of SARS-CoV-2 by FDA under an Emergency Use Authorization (EUA).  This EUA will remain in effect (meaning this test can be us  ed) for the duration of  the COVID-19 declaration under Section 564(b)(1) of the Act, 21 U.S.C. section 360bbb-3(b)(1), unless the authorization is terminated or revoked sooner.     Influenza A by PCR NEGATIVE NEGATIVE Final   Influenza B by PCR NEGATIVE NEGATIVE Final    Comment: (NOTE) The Xpert Xpress SARS-CoV-2/FLU/RSV plus assay is intended as an aid in the diagnosis of influenza from Nasopharyngeal swab specimens and should not be used as a sole basis for treatment. Nasal washings and aspirates are unacceptable for Xpert Xpress SARS-CoV-2/FLU/RSV testing.  Fact Sheet for Patients: BloggerCourse.com  Fact Sheet for Healthcare Providers: SeriousBroker.it  This test is not yet approved or cleared by the [place]  FDA and has been authorized for detection and/or diagnosis of SARS-CoV-2 by FDA under an Emergency Use Authorization (EUA). This EUA will remain in effect (meaning this test can be used) for the duration of the COVID-19 declaration under Section 564(b)(1) of the Act, 21 U.S.C. section 360bbb-3(b)(1), unless the authorization is terminated or revoked.  Performed at [facility], [address] [place], [place] [address]     Coagulation Studies: Recent Labs    03/30/24 1823  LABPROT 14.4  INR 1.1    Urinalysis: No results for input(s): "COLORURINE", "LABSPEC", "PHURINE", "GLUCOSEU", "HGBUR", "BILIRUBINUR", "KETONESUR", "PROTEINUR", "UROBILINOGEN", "NITRITE", "LEUKOCYTESUR" in the last 72 hours.  Invalid input(s):  "APPERANCEUR"    Imaging: MR BRAIN WO CONTRAST Result Date: 04/01/2024 CLINICAL DATA:  Initial evaluation for acute neuro deficit, stroke suspected. EXAM: MRI HEAD WITHOUT CONTRAST TECHNIQUE: Multiplanar, multiecho pulse sequences of the brain and surrounding structures were obtained without intravenous contrast. COMPARISON:  Comparison made with prior studies from 03/30/2024 FINDINGS: Brain: Cerebral volume within normal limits. No significant cerebral white matter disease for age. There is apparent diffusely increased signal intensity throughout the CSF containing spaces on FLAIR sequence, favored to be artifactual in nature. No signal changes seen on corresponding sequences. No susceptibility artifact to suggest hemorrhage. No abnormal foci of restricted diffusion to suggest acute or subacute ischemia. Gray-white matter disease shin maintained. No areas of chronic cortical infarction. No acute or chronic intracranial blood products. No mass lesion, midline shift or mass effect. No hydrocephalus or extra-axial fluid collection. Pituitary gland within normal limits. Vascular: Major intracranial vascular flow voids are maintained. Skull and upper cervical spine: Craniocervical junction within normal limits. Bone marrow signal intensity heterogeneous but overall within normal limits. 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 No scalp soft tissue abnormality. Sinuses/Orbits: Mildly increased CSF signal intensity noted along the optic nerve sheaths bilaterally. Globes orbital soft tissues otherwise unremarkable. Paranasal sinuses are clear. No significant mastoid effusion. Other: None. IMPRESSION: 1. No acute intracranial abnormality. 2. Apparent diffusely increased signal intensity throughout the CSF containing spaces  on FLAIR sequence. This is felt to almost certainly be artifactual in nature, although if there is concern for meningitis then correlation with  CSF analysis would be recommended. No associated susceptibility artifact to suggest hemorrhage. 3. 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 Electronically Signed   By: Virgia Griffins M.D.   On: 04/01/2024 03:43   MR Angiogram Neck W or Wo Contrast Result Date: 03/31/2024 CLINICAL DATA:  Initial evaluation for acute neuro deficit, stroke. EXAM: MRA NECK WITHOUT AND WITH CONTRAST MRA HEAD WITHOUT CONTRAST TECHNIQUE: Multiplanar and multiecho pulse sequences of the neck were obtained without and with intravenous contrast. Angiographic images of the neck were obtained using MRA technique without and with intravenous contast.; Angiographic images of the Circle of Willis were obtained using MRA technique without and with intravenous contrast. CONTRAST:  9mL GADAVIST  GADOBUTROL  1 MMOL/ML IV SOLN COMPARISON:  Prior CT from earlier the same day as well as prior study from 08/31/2021. FINDINGS: MRA NECK FINDINGS Aortic arch: Examination severely limited by motion artifact and poor timing of the contrast bolus. Postcontrast sequences are markedly limited, with minimal contrast within the arterial system. Partially visualized aortic arch grossly within normal limits for caliber with standard branch pattern ir. No visible stenosis about the origin the great vessels. Visualized right common and internal carotid arteries are patent with antegrade flow. No visible dissection or hemodynamically significant stenosis. Right carotid system: Visualized left common and internal carotid arteries are patent with antegrade flow. No visible dissection or hemodynamically significant stenosis. Left carotid system: Visualized left common and internal carotid arteries are patent with antegrade flow. No visible dissection or hemodynamically significant stenosis. Vertebral arteries: Visualized portions of the vertebral arteries are patent with antegrade flow. No visible dissection or  stenosis. Other: None. MRA HEAD FINDINGS Anterior circulation: Examination markedly degraded by motion artifact. Both internal carotid arteries are patent through the siphons without visible stenosis. Questionable 2 mm outpouching extending laterally from the cavernous left ICA (series 5, image 71). Difficult to be certain of this finding given extensive motion artifact on this exam. A1 segments patent bilaterally. Grossly normal anterior communicating artery complex. Anterior cerebral arteries patent without visible stenosis. No M1 stenosis or occlusion. Distal MCA branches grossly perfused and symmetric. Posterior circulation: Visualized V4 segments patent without stenosis. Neither PICA origin well visualized. Basilar patent without stenosis. Superior cerebral arteries patent bilaterally. Both PCAs primarily supplied via the basilar. Focal moderate proximal right P2 stenosis (series 1047, image 7). PCAs otherwise patent to their distal aspects without proximal stenosis. Anatomic variants: None significant. Other: None. IMPRESSION: MRA HEAD: 1. Motion degraded exam. 2. Negative intracranial MRA for large vessel occlusion. 3. Focal moderate proximal right P2 stenosis. 4. Question 2 mm outpouching extending laterally from the cavernous left ICA. While this finding may be artifactual due to motion, a possible small aneurysm is difficult to exclude. Attention at follow-up recommended. MRA NECK: 1. Severely limited exam due to motion and poor timing of the contrast bolus. 2. Grossly negative MRA of the neck. No visible hemodynamically significant stenosis or other acute vascular abnormality. Electronically Signed   By: Virgia Griffins M.D.   On: 03/31/2024 00:40   MR ANGIO HEAD WO CONTRAST Result Date: 03/31/2024 CLINICAL DATA:  Initial evaluation for acute neuro deficit, stroke. EXAM: MRA NECK WITHOUT AND WITH CONTRAST MRA HEAD WITHOUT CONTRAST TECHNIQUE: Multiplanar and multiecho pulse sequences of the neck  were obtained without and with intravenous contrast. Angiographic images of the neck  were obtained using MRA technique without and with intravenous contast.; Angiographic images of the Circle of Willis were obtained using MRA technique without and with intravenous contrast. CONTRAST:  9mL GADAVIST  GADOBUTROL  1 MMOL/ML IV SOLN COMPARISON:  Prior CT from earlier the same day as well as prior study from 08/31/2021. FINDINGS: MRA NECK FINDINGS Aortic arch: Examination severely limited by motion artifact and poor timing of the contrast bolus. Postcontrast sequences are markedly limited, with minimal contrast within the arterial system. Partially visualized aortic arch grossly within normal limits for caliber with standard branch pattern ir. No visible stenosis about the origin the great vessels. Visualized right common and internal carotid arteries are patent with antegrade flow. No visible dissection or hemodynamically significant stenosis. Right carotid system: Visualized left common and internal carotid arteries are patent with antegrade flow. No visible dissection or hemodynamically significant stenosis. Left carotid system: Visualized left common and internal carotid arteries are patent with antegrade flow. No visible dissection or hemodynamically significant stenosis. Vertebral arteries: Visualized portions of the vertebral arteries are patent with antegrade flow. No visible dissection or stenosis. Other: None. MRA HEAD FINDINGS Anterior circulation: Examination markedly degraded by motion artifact. Both internal carotid arteries are patent through the siphons without visible stenosis. Questionable 2 mm outpouching extending laterally from the cavernous left ICA (series 5, image 71). Difficult to be certain of this finding given extensive motion artifact on this exam. A1 segments patent bilaterally. Grossly normal anterior communicating artery complex. Anterior cerebral arteries patent without visible stenosis. No  M1 stenosis or occlusion. Distal MCA branches grossly perfused and symmetric. Posterior circulation: Visualized V4 segments patent without stenosis. Neither PICA origin well visualized. Basilar patent without stenosis. Superior cerebral arteries patent bilaterally. Both PCAs primarily supplied via the basilar. Focal moderate proximal right P2 stenosis (series 1047, image 7). PCAs otherwise patent to their distal aspects without proximal stenosis. Anatomic variants: None significant. Other: None. IMPRESSION: MRA HEAD: 1. Motion degraded exam. 2. Negative intracranial MRA for large vessel occlusion. 3. Focal moderate proximal right P2 stenosis. 4. Question 2 mm outpouching extending laterally from the cavernous left ICA. While this finding may be artifactual due to motion, a possible small aneurysm is difficult to exclude. Attention at follow-up recommended. MRA NECK: 1. Severely limited exam due to motion and poor timing of the contrast bolus. 2. Grossly negative MRA of the neck. No visible hemodynamically significant stenosis or other acute vascular abnormality. Electronically Signed   By: Virgia Griffins M.D.   On: 03/31/2024 00:40   CT HEAD CODE STROKE WO CONTRAST Result Date: 03/30/2024 CLINICAL DATA:  Code stroke. Provided history: Neuro deficit, acute, stroke suspected. Left arm numbness. History of TIA. EXAM: CT HEAD WITHOUT CONTRAST TECHNIQUE: Contiguous axial images were obtained from the base of the skull through the vertex without intravenous contrast. RADIATION DOSE REDUCTION: This exam was performed according to the departmental dose-optimization program which includes automated exposure control, adjustment of the mA and/or kV according to patient size and/or use of iterative reconstruction technique. COMPARISON:  Brain MRI 08/31/2021. FINDINGS: Brain: No age-advanced or lobar predominant cerebral atrophy. There is no acute intracranial hemorrhage. No demarcated cortical infarct. No extra-axial  fluid collection. No evidence of an intracranial mass. No midline shift. Vascular: No hyperdense vessel.  Atherosclerotic calcifications. Skull: No calvarial fracture or aggressive osseous lesion. Sinuses/Orbits: No mass or acute finding within the imaged orbits. Minimal mucosal thickening within the right maxillary sinus at the imaged levels. ASPECTS [facility] Stroke Program Early CT Score) - Ganglionic level  infarction (caudate, lentiform nuclei, internal capsule, insula, M1-M3 cortex): 7 - Supraganglionic infarction (M4-M6 cortex): 3 Total score (0-10 with 10 being normal): 10 No acute intracranial finding. These results were communicated to Dr. Doretta Gant At 6:32 pmon 5/20/2025by text page via the [facility] messaging system. IMPRESSION: No acute intracranial finding. ASPECTS is 10. Electronically Signed   By: Bascom Lily D.O.   On: 03/30/2024 18:33     Medications:     amLODipine   5 mg Oral QHS   aspirin  EC  81 mg Oral QHS   calcitRIOL   0.25 mcg Oral Daily   carvedilol   25 mg Oral BID WC   Chlorhexidine  Gluconate Cloth  6 each Topical Q0600   clopidogrel   75 mg Oral Daily   heparin  injection (subcutaneous)  5,000 Units Subcutaneous Q8H   levothyroxine   112 mcg Oral Q0600   pantoprazole   40 mg Oral QHS   rosuvastatin   20 mg Oral QHS   sodium chloride  flush  3 mL Intravenous Once   acetaminophen , heparin , lidocaine -prilocaine , ondansetron , pentafluoroprop-tetrafluoroeth  Assessment/ Plan:  Mr. Tyrone Moore is a 73 y.o.  male with past medical conditions including tobacco abuse, hypothyroidism, hyperlipidemia, hypertension, and end-stage renal disease on hemodialysis.  Patient reports to the emergency department with complaints of weakness and has been admitted for CVA (cerebral vascular accident) (HCC) [I63.9] Weakness of left upper extremity [R29.898] Left upper extremity numbness [R20.0]  [facility]/TTS/left aVF  Anemia of chronic kidney disease Lab Results  Component Value Date    HGB 11.3 (L) 03/31/2024   Hemoglobin within optimal range for renal patient.  2.  Strokelike symptoms, reports left hand pain, numbness, weakness, and hearing loss in left ear.  Imaging negative for acute infarct.  3.  End-stage renal disease on hemodialysis.  Last treatment completed on Tuesday.  Patient scheduled to receive treatment later today.  4. Secondary Hyperparathyroidism: with outpatient labs: PTH 51, phosphorus 4.2, calcium  9.1 on 3/11.  :  Lab Results  Component Value Date   CALCIUM  9.1 03/31/2024    Will continue to monitor bone minerals during this admission.   LOS: 0 Mihika Surrette 5/22/202512:53 PM

## 2024-04-01 NOTE — Discharge Summary (Signed)
 Physician Discharge Summary   Patient: Tyrone Moore MRN: [id number] DOB: 11/09/1951  Admit date:     03/30/2024  Discharge date: 04/01/2024  Discharge Physician: Sheril Dines   PCP: Sheron Dixons, MD   Recommendations at discharge:   Outpatient follow-up with neurologist in 1 month Outpatient follow-up with otolaryngologist in 1 week  Discharge Diagnoses: Principal Problem:   CVA (cerebral vascular accident) [facility]) Active Problems:   Mixed hyperlipidemia   Acquired hypothyroidism   Essential hypertension   Tobacco use disorder   ESRD on dialysis [facility])  Resolved Problems:   * No resolved hospital problems. [facility] Course: No notes on file  Assessment and Plan:  Strokelike symptoms with weakness and numbness in the left arm, dizziness, unsteady gait and decreased hearing in the left ear: Symptoms attributed to TIA.  No acute abnormality on CT head.  No acute stroke on MRI brain.   2D echo showed EF estimated at 50 to 55%, borderline concentric LVH, grade 2 diastolic dysfunction, no evidence of atrial shunt. Neurologist recommended dual antiplatelet therapy with aspirin  and Plavix  for 21 days followed by Plavix  monotherapy. ?  small left ICA aneurysm versus artifact on MRA neck. Outpatient follow-up with neurologist for monitoring and further management   Left hearing impairment, tinnitus: Hearing in the left ear has improved but there is still some hearing impairment.  This is fairly new.  He also complained of tinnitus that has been going on intermittently for about a year.  Recommended outpatient follow-up with ENT for further evaluation.   ESRD: He is on hemodialysis on TTS schedule.  Outpatient follow-up with nephrologist for hemodialysis He reported intermittent/chronic dizziness after dialysis.     Macrocytosis: Vitamin B12 level and iron studies were normal.       Comorbidities include hypertension, hypothyroidism, hyperlipidemia, tobacco use  disorder   His condition has improved and he is deemed stable for discharge to home today.  Discharge plan was discussed with his son, Mr. Tyrone Moore, over the phone.      Consultants: Nephrologist, neurologist Procedures performed: None Disposition: Home Diet recommendation:  Discharge Diet Orders (From admission, onward)     Start     Ordered   04/01/24 0000  Diet - low sodium heart healthy        04/01/24 1758           Renal diet DISCHARGE MEDICATION: Allergies as of 04/01/2024   No Known Allergies      Medication List     TAKE these medications    acetaminophen  500 MG tablet Commonly known as: TYLENOL  Take 1,000 mg by mouth every 6 (six) hours as needed for moderate pain.   amLODipine 2.5 MG tablet Commonly known as: NORVASC Take 2.5 mg by mouth at bedtime.   aspirin  EC 81 MG tablet Take 1 tablet (81 mg total) by mouth at bedtime for 20 days. Swallow whole.   calcitRIOL 0.25 MCG capsule Commonly known as: ROCALTROL Take 0.25 mcg by mouth at bedtime.   carvedilol  25 MG tablet Commonly known as: COREG  TAKE 1 TABLET BY MOUTH TWICE DAILY WITH A MEAL   clopidogrel  75 MG tablet Commonly known as: PLAVIX  Take 1 tablet (75 mg total) by mouth daily. Start taking on: Apr 02, 2024   D3-1000 PO Take 2,000 Units by mouth at bedtime.   ferrous sulfate 325 (65 FE) MG tablet Take 325 mg by mouth at bedtime.   levothyroxine  112 MCG tablet Commonly known as: Euthyrox  Take 1  tablet (112 mcg total) by mouth daily before breakfast.   lidocaine -prilocaine cream Commonly known as: EMLA Apply 1 Application topically as directed.   MULTIVITAL-M PO Take 1 tablet by mouth at bedtime.   omeprazole  20 MG capsule Commonly known as: PRILOSEC Take 1 capsule by mouth once daily What changed: when to take this   ondansetron  4 MG disintegrating tablet Commonly known as: ZOFRAN -ODT Take 4 mg by mouth every 6 (six) hours as needed.   rosuvastatin  5 MG  tablet Commonly known as: CRESTOR  Take 4 tablets (20 mg total) by mouth at bedtime. What changed:  how much to take when to take this        Follow-up Information     [facility]. Schedule an appointment as soon as possible for a visit in 1 week(s).   Contact information: [address] [place]  [address] [phone]        Sheron Dixons, MD Follow up.   Specialty: Internal Medicine Why: Hospital follow up Contact information: [address] [place] [address] [phone]                Discharge Exam: Tyrone Moore Weights   04/01/24 1625  Weight: 90.9 kg   GEN: NAD SKIN: Warm and dry EYES: No pallor or icterus ENT: MMM CV: RRR PULM: CTA B ABD: soft, ND, NT, +BS CNS: AAO x 3, non focal EXT: No edema or tenderness   Condition at discharge: good  The results of significant diagnostics from this hospitalization (including imaging, microbiology, ancillary and laboratory) are listed below for reference.   Imaging Studies: ECHOCARDIOGRAM COMPLETE Result Date: 04/01/2024    ECHOCARDIOGRAM REPORT   Patient Name:   Tyrone Moore Date of Exam: 03/31/2024 Medical Rec #:  [id number]     Height:       71.0 in Accession #:    [id number]    Weight:       204.0 lb Date of Birth:  [birth year]     BSA:          2.126 m Patient Age:    73 years      BP:           174/94 mmHg Patient Gender: M             HR:           61 bpm. Exam Location:  [facility] Procedure: 2D Echo (Both Spectral and Color Flow Doppler were utilized during            procedure). Indications:     stroke  History:         Patient has prior history of Echocardiogram examinations, most                  recent 08/31/2021. COPD and end stage renal disease; Risk                  Factors:Hypertension, Dyslipidemia and Current Smoker.  Sonographer:     Dione Franks RDCS Referring Phys:  [id number] Sheril Dines Diagnosing Phys: Dwayne D Callwood MD IMPRESSIONS  1.  Left ventricular ejection fraction, by estimation, is 50 to 55%. The left ventricle has low normal function. The left ventricle has no regional wall motion abnormalities. The left ventricular internal cavity size was moderately dilated. Left ventricular diastolic parameters are consistent with Grade II diastolic dysfunction (pseudonormalization).  2. Right ventricular systolic function is normal. The right ventricular size is  normal. Mildly increased right ventricular wall thickness.  3. Left atrial size was mildly dilated.  4. Right atrial size was mildly dilated.  5. The mitral valve is normal in structure. Trivial mitral valve regurgitation.  6. The aortic valve is calcified. Aortic valve regurgitation is trivial. Mild aortic valve stenosis. FINDINGS  Left Ventricle: Left ventricular ejection fraction, by estimation, is 50 to 55%. The left ventricle has low normal function. The left ventricle has no regional wall motion abnormalities. Strain was performed and the global longitudinal strain is indeterminate. Global longitudinal strain performed but not reported based on interpreter judgement due to suboptimal tracking. The left ventricular internal cavity size was moderately dilated. There is borderline concentric left ventricular hypertrophy.  Left ventricular diastolic parameters are consistent with Grade II diastolic dysfunction (pseudonormalization). Right Ventricle: The right ventricular size is normal. Mildly increased right ventricular wall thickness. Right ventricular systolic function is normal. Left Atrium: Left atrial size was mildly dilated. Right Atrium: Right atrial size was mildly dilated. Pericardium: There is no evidence of pericardial effusion. Mitral Valve: The mitral valve is normal in structure. Trivial mitral valve regurgitation. Tricuspid Valve: The tricuspid valve is normal in structure. Tricuspid valve regurgitation is mild. Aortic Valve: The aortic valve is calcified. Aortic valve  regurgitation is trivial. Aortic regurgitation PHT measures 500 msec. Mild aortic stenosis is present. Aortic valve mean gradient measures 20.0 mmHg. Aortic valve peak gradient measures 37.0 mmHg.  Aortic valve area, by VTI measures 0.92 cm. Pulmonic Valve: The pulmonic valve was normal in structure. Pulmonic valve regurgitation is not visualized. Aorta: The ascending aorta was not well visualized. IAS/Shunts: No atrial level shunt detected by color flow Doppler. Additional Comments: 3D was performed not requiring image post processing on an independent workstation and was indeterminate.  LEFT VENTRICLE PLAX 2D LVIDd:         5.60 cm   Diastology LVIDs:         4.60 cm   LV e' medial:    7.51 cm/s LV PW:         1.10 cm   LV E/e' medial:  14.8 LV IVS:        1.10 cm   LV e' lateral:   9.03 cm/s LVOT diam:     2.00 cm   LV E/e' lateral: 12.3 LV SV:         65 LV SV Index:   30 LVOT Area:     3.14 cm  RIGHT VENTRICLE             IVC RV Basal diam:  2.80 cm     IVC diam: 2.30 cm RV S prime:     14.50 cm/s TAPSE (M-mode): 2.3 cm LEFT ATRIUM             Index        RIGHT ATRIUM           Index LA Vol (A2C):   67.4 ml 31.70 ml/m  RA Area:     16.70 cm LA Vol (A4C):   60.7 ml 28.55 ml/m  RA Volume:   42.20 ml  19.85 ml/m LA Biplane Vol: 64.2 ml 30.19 ml/m  AORTIC VALVE AV Area (Vmax):    1.08 cm AV Area (Vmean):   0.94 cm AV Area (VTI):     0.92 cm AV Vmax:           304.00 cm/s AV Vmean:          209.000 cm/s AV VTI:  0.704 m AV Peak Grad:      37.0 mmHg AV Mean Grad:      20.0 mmHg LVOT Vmax:         104.73 cm/s LVOT Vmean:        62.433 cm/s LVOT VTI:          0.206 m LVOT/AV VTI ratio: 0.29 AI PHT:            500 msec  AORTA Ao Root diam: 2.90 cm MITRAL VALVE MV Area (PHT): 3.72 cm     SHUNTS MV Decel Time: 204 msec     Systemic VTI:  0.21 m MV E velocity: 111.00 cm/s  Systemic Diam: 2.00 cm MV A velocity: 70.90 cm/s MV E/A ratio:  1.57 Antonette Batters MD Electronically signed by Antonette Batters MD Signature Date/Time: 04/01/2024/1:31:45 PM    Final    MR BRAIN WO CONTRAST Result Date: 04/01/2024 CLINICAL DATA:  Initial evaluation for acute neuro deficit, stroke suspected. EXAM: MRI HEAD WITHOUT CONTRAST TECHNIQUE: Multiplanar, multiecho pulse sequences of the brain and surrounding structures were obtained without intravenous contrast. COMPARISON:  Comparison made with prior studies from 03/30/2024 FINDINGS: Brain: Cerebral volume within normal limits. No significant cerebral white matter disease for age. There is apparent diffusely increased signal intensity throughout the CSF containing spaces on FLAIR sequence, favored to be artifactual in nature. No signal changes seen on corresponding sequences. No susceptibility artifact to suggest hemorrhage. No abnormal foci of restricted diffusion to suggest acute or subacute ischemia. Gray-white matter disease shin maintained. No areas of chronic cortical infarction. No acute or chronic intracranial blood products. No mass lesion, midline shift or mass effect. No hydrocephalus or extra-axial fluid collection. Pituitary gland within normal limits. Vascular: Major intracranial vascular flow voids are maintained. Skull and upper cervical spine: Craniocervical junction within normal limits. Bone marrow signal intensity heterogeneous but overall within normal limits. 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 No scalp soft tissue abnormality. Sinuses/Orbits: Mildly increased CSF signal intensity noted along the optic nerve sheaths bilaterally. Globes orbital soft tissues otherwise unremarkable. Paranasal sinuses are clear. No significant mastoid effusion. Other: None. IMPRESSION: 1. No acute intracranial abnormality. 2. Apparent diffusely increased signal intensity throughout the CSF containing spaces on FLAIR sequence. This is felt to almost certainly be artifactual in nature, although if there is concern  for meningitis then correlation with CSF analysis would be recommended. No associated susceptibility artifact to suggest hemorrhage. 3. 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 Electronically Signed   By: Virgia Griffins M.D.   On: 04/01/2024 03:43   MR Angiogram Neck W or Wo Contrast Result Date: 03/31/2024 CLINICAL DATA:  Initial evaluation for acute neuro deficit, stroke. EXAM: MRA NECK WITHOUT AND WITH CONTRAST MRA HEAD WITHOUT CONTRAST TECHNIQUE: Multiplanar and multiecho pulse sequences of the neck were obtained without and with intravenous contrast. Angiographic images of the neck were obtained using MRA technique without and with intravenous contast.; Angiographic images of the Circle of Willis were obtained using MRA technique without and with intravenous contrast. CONTRAST:  9mL GADAVIST  GADOBUTROL  1 MMOL/ML IV SOLN COMPARISON:  Prior CT from earlier the same day as well as prior study from 08/31/2021. FINDINGS: MRA NECK FINDINGS Aortic arch: Examination severely limited by motion artifact and poor timing of the contrast bolus. Postcontrast sequences are markedly limited, with minimal contrast within the arterial system. Partially visualized aortic arch grossly within normal limits  for caliber with standard branch pattern ir. No visible stenosis about the origin the great vessels. Visualized right common and internal carotid arteries are patent with antegrade flow. No visible dissection or hemodynamically significant stenosis. Right carotid system: Visualized left common and internal carotid arteries are patent with antegrade flow. No visible dissection or hemodynamically significant stenosis. Left carotid system: Visualized left common and internal carotid arteries are patent with antegrade flow. No visible dissection or hemodynamically significant stenosis. Vertebral arteries: Visualized portions of the vertebral arteries are patent with  antegrade flow. No visible dissection or stenosis. Other: None. MRA HEAD FINDINGS Anterior circulation: Examination markedly degraded by motion artifact. Both internal carotid arteries are patent through the siphons without visible stenosis. Questionable 2 mm outpouching extending laterally from the cavernous left ICA (series 5, image 71). Difficult to be certain of this finding given extensive motion artifact on this exam. A1 segments patent bilaterally. Grossly normal anterior communicating artery complex. Anterior cerebral arteries patent without visible stenosis. No M1 stenosis or occlusion. Distal MCA branches grossly perfused and symmetric. Posterior circulation: Visualized V4 segments patent without stenosis. Neither PICA origin well visualized. Basilar patent without stenosis. Superior cerebral arteries patent bilaterally. Both PCAs primarily supplied via the basilar. Focal moderate proximal right P2 stenosis (series 1047, image 7). PCAs otherwise patent to their distal aspects without proximal stenosis. Anatomic variants: None significant. Other: None. IMPRESSION: MRA HEAD: 1. Motion degraded exam. 2. Negative intracranial MRA for large vessel occlusion. 3. Focal moderate proximal right P2 stenosis. 4. Question 2 mm outpouching extending laterally from the cavernous left ICA. While this finding may be artifactual due to motion, a possible small aneurysm is difficult to exclude. Attention at follow-up recommended. MRA NECK: 1. Severely limited exam due to motion and poor timing of the contrast bolus. 2. Grossly negative MRA of the neck. No visible hemodynamically significant stenosis or other acute vascular abnormality. Electronically Signed   By: Virgia Griffins M.D.   On: 03/31/2024 00:40   MR ANGIO HEAD WO CONTRAST Result Date: 03/31/2024 CLINICAL DATA:  Initial evaluation for acute neuro deficit, stroke. EXAM: MRA NECK WITHOUT AND WITH CONTRAST MRA HEAD WITHOUT CONTRAST TECHNIQUE: Multiplanar and  multiecho pulse sequences of the neck were obtained without and with intravenous contrast. Angiographic images of the neck were obtained using MRA technique without and with intravenous contast.; Angiographic images of the Circle of Willis were obtained using MRA technique without and with intravenous contrast. CONTRAST:  9mL GADAVIST  GADOBUTROL  1 MMOL/ML IV SOLN COMPARISON:  Prior CT from earlier the same day as well as prior study from 08/31/2021. FINDINGS: MRA NECK FINDINGS Aortic arch: Examination severely limited by motion artifact and poor timing of the contrast bolus. Postcontrast sequences are markedly limited, with minimal contrast within the arterial system. Partially visualized aortic arch grossly within normal limits for caliber with standard branch pattern ir. No visible stenosis about the origin the great vessels. Visualized right common and internal carotid arteries are patent with antegrade flow. No visible dissection or hemodynamically significant stenosis. Right carotid system: Visualized left common and internal carotid arteries are patent with antegrade flow. No visible dissection or hemodynamically significant stenosis. Left carotid system: Visualized left common and internal carotid arteries are patent with antegrade flow. No visible dissection or hemodynamically significant stenosis. Vertebral arteries: Visualized portions of the vertebral arteries are patent with antegrade flow. No visible dissection or stenosis. Other: None. MRA HEAD FINDINGS Anterior circulation: Examination markedly degraded by motion artifact. Both internal carotid arteries are patent through the  siphons without visible stenosis. Questionable 2 mm outpouching extending laterally from the cavernous left ICA (series 5, image 71). Difficult to be certain of this finding given extensive motion artifact on this exam. A1 segments patent bilaterally. Grossly normal anterior communicating artery complex. Anterior cerebral  arteries patent without visible stenosis. No M1 stenosis or occlusion. Distal MCA branches grossly perfused and symmetric. Posterior circulation: Visualized V4 segments patent without stenosis. Neither PICA origin well visualized. Basilar patent without stenosis. Superior cerebral arteries patent bilaterally. Both PCAs primarily supplied via the basilar. Focal moderate proximal right P2 stenosis (series 1047, image 7). PCAs otherwise patent to their distal aspects without proximal stenosis. Anatomic variants: None significant. Other: None. IMPRESSION: MRA HEAD: 1. Motion degraded exam. 2. Negative intracranial MRA for large vessel occlusion. 3. Focal moderate proximal right P2 stenosis. 4. Question 2 mm outpouching extending laterally from the cavernous left ICA. While this finding may be artifactual due to motion, a possible small aneurysm is difficult to exclude. Attention at follow-up recommended. MRA NECK: 1. Severely limited exam due to motion and poor timing of the contrast bolus. 2. Grossly negative MRA of the neck. No visible hemodynamically significant stenosis or other acute vascular abnormality. Electronically Signed   By: Virgia Griffins M.D.   On: 03/31/2024 00:40   CT HEAD CODE STROKE WO CONTRAST Result Date: 03/30/2024 CLINICAL DATA:  Code stroke. Provided history: Neuro deficit, acute, stroke suspected. Left arm numbness. History of TIA. EXAM: CT HEAD WITHOUT CONTRAST TECHNIQUE: Contiguous axial images were obtained from the base of the skull through the vertex without intravenous contrast. RADIATION DOSE REDUCTION: This exam was performed according to the departmental dose-optimization program which includes automated exposure control, adjustment of the mA and/or kV according to patient size and/or use of iterative reconstruction technique. COMPARISON:  Brain MRI 08/31/2021. FINDINGS: Brain: No age-advanced or lobar predominant cerebral atrophy. There is no acute intracranial hemorrhage. No  demarcated cortical infarct. No extra-axial fluid collection. No evidence of an intracranial mass. No midline shift. Vascular: No hyperdense vessel.  Atherosclerotic calcifications. Skull: No calvarial fracture or aggressive osseous lesion. Sinuses/Orbits: No mass or acute finding within the imaged orbits. Minimal mucosal thickening within the right maxillary sinus at the imaged levels. ASPECTS [facility] Stroke Program Early CT Score) - Ganglionic level infarction (caudate, lentiform nuclei, internal capsule, insula, M1-M3 cortex): 7 - Supraganglionic infarction (M4-M6 cortex): 3 Total score (0-10 with 10 being normal): 10 No acute intracranial finding. These results were communicated to Dr. Doretta Gant At 6:32 pmon 5/20/2025by text page via the [facility] messaging system. IMPRESSION: No acute intracranial finding. ASPECTS is 10. Electronically Signed   By: Bascom Lily D.O.   On: 03/30/2024 18:33    Microbiology: Results for orders placed or performed during the hospital encounter of 08/31/21  Resp Panel by RT-PCR (Flu A&B, Covid) Nasopharyngeal Swab     Status: Abnormal   Collection Time: 08/31/21 11:01 AM   Specimen: Nasopharyngeal Swab; Nasopharyngeal(NP) swabs in vial transport medium  Result Value Ref Range Status   SARS Coronavirus 2 by RT PCR POSITIVE (A) NEGATIVE Final    Comment: RESULT CALLED TO, READ BACK BY AND VERIFIED WITH: Nial ZANDESS 08/31/21 1232 KLW (NOTE) SARS-CoV-2 target nucleic acids are DETECTED.  The SARS-CoV-2 RNA is generally detectable in upper respiratory specimens during the acute phase of infection. Positive results are indicative of the presence of the identified virus, but do not rule out bacterial infection or co-infection with other pathogens not detected by the test. Clinical correlation with  patient history and other diagnostic information is necessary to determine patient infection status. The expected result is Negative.  Fact Sheet for  Patients: BloggerCourse.com  Fact Sheet for Healthcare Providers: SeriousBroker.it  This test is not yet approved or cleared by the [place]  FDA and  has been authorized for detection and/or diagnosis of SARS-CoV-2 by FDA under an Emergency Use Authorization (EUA).  This EUA will remain in effect (meaning this test can be us  ed) for the duration of  the COVID-19 declaration under Section 564(b)(1) of the Act, 21 U.S.C. section 360bbb-3(b)(1), unless the authorization is terminated or revoked sooner.     Influenza A by PCR NEGATIVE NEGATIVE Final   Influenza B by PCR NEGATIVE NEGATIVE Final    Comment: (NOTE) The Xpert Xpress SARS-CoV-2/FLU/RSV plus assay is intended as an aid in the diagnosis of influenza from Nasopharyngeal swab specimens and should not be used as a sole basis for treatment. Nasal washings and aspirates are unacceptable for Xpert Xpress SARS-CoV-2/FLU/RSV testing.  Fact Sheet for Patients: BloggerCourse.com  Fact Sheet for Healthcare Providers: SeriousBroker.it  This test is not yet approved or cleared by the [place]  FDA and has been authorized for detection and/or diagnosis of SARS-CoV-2 by FDA under an Emergency Use Authorization (EUA). This EUA will remain in effect (meaning this test can be used) for the duration of the COVID-19 declaration under Section 564(b)(1) of the Act, 21 U.S.C. section 360bbb-3(b)(1), unless the authorization is terminated or revoked.  Performed at [facility], [address] [place], [place] [address]     Labs: CBC: Recent Labs  Lab 03/30/24 1823 03/31/24 0506  WBC 8.8 6.8  NEUTROABS 6.5  --   HGB 11.7* 11.3*  HCT 36.4* 35.3*  MCV 103.4* 104.1*  PLT 143* 130*   Basic Metabolic Panel: Recent Labs  Lab 03/30/24 1823 03/31/24 0506  NA 143 143  K 3.6 3.9  CL 103 104  CO2 29 32  GLUCOSE 127*  90  BUN 19 21  CREATININE 2.79* 3.39*  CALCIUM  8.9 9.1   Liver Function Tests: Recent Labs  Lab 03/30/24 1823  AST 28  ALT 36  ALKPHOS 113  BILITOT 0.7  PROT 7.5  ALBUMIN 3.7   CBG: Recent Labs  Lab 03/30/24 1804  GLUCAP 137*  136*    Discharge time spent: greater than 30 minutes.  Signed: Sheril Dines, MD [facility] 04/01/2024

## 2024-04-01 NOTE — Progress Notes (Signed)
 Patient returned to floor from dialysis.  Remains free from any noted signs of acute distress.  Has verbally denied any pain/discomfort.  Noted to have son/caregiver in room to assist with discharge.  Dressing to left forearm AV fistula remains clean/dry/intact upon inspection.  RN able to review with patient and caregiver/son on discharge information-printed instructions given to patient and caregiver prior to patient departing hospital.  Both patient and caregiver verbalized understanding of information reviewed.  Patient assisted to car/vehicle via wheelchair.

## 2024-04-01 NOTE — Progress Notes (Signed)
 Hemodialysis Note:  Received patient in bed to unit. Alert and oriented. Informed consent singed and in chart.  Treatment initiated: 1638 Treatment completed: 1945  Access used: Left AVF Access issues: None  Patient tolerated well. Transported back to room, alert without acute distress. Report given to patient's RN.  Total UF removed: 0 Medications given: None  Post HD weight: 90.9 Kg  [facility]

## 2024-04-03 DIAGNOSIS — N186 End stage renal disease: Secondary | ICD-10-CM | POA: Diagnosis not present

## 2024-04-03 DIAGNOSIS — Z992 Dependence on renal dialysis: Secondary | ICD-10-CM | POA: Diagnosis not present

## 2024-04-05 ENCOUNTER — Other Ambulatory Visit: Payer: Self-pay | Admitting: Internal Medicine

## 2024-04-05 DIAGNOSIS — I10 Essential (primary) hypertension: Secondary | ICD-10-CM

## 2024-04-05 DIAGNOSIS — E782 Mixed hyperlipidemia: Secondary | ICD-10-CM

## 2024-04-05 LAB — VITAMIN B1: Vitamin B1 (Thiamine): 167.5 nmol/L (ref 66.5–200.0)

## 2024-04-06 DIAGNOSIS — N186 End stage renal disease: Secondary | ICD-10-CM | POA: Diagnosis not present

## 2024-04-06 DIAGNOSIS — Z992 Dependence on renal dialysis: Secondary | ICD-10-CM | POA: Diagnosis not present

## 2024-04-08 ENCOUNTER — Other Ambulatory Visit: Payer: Self-pay | Admitting: Internal Medicine

## 2024-04-08 DIAGNOSIS — N186 End stage renal disease: Secondary | ICD-10-CM | POA: Diagnosis not present

## 2024-04-08 DIAGNOSIS — Z992 Dependence on renal dialysis: Secondary | ICD-10-CM | POA: Diagnosis not present

## 2024-04-08 DIAGNOSIS — E782 Mixed hyperlipidemia: Secondary | ICD-10-CM

## 2024-04-08 MED ORDER — ROSUVASTATIN CALCIUM 20 MG PO TABS: 20.0000 mg | ORAL_TABLET | Freq: Every day | ORAL | 1 refills | Status: DC

## 2024-04-08 NOTE — Telephone Encounter (Signed)
 Requested medication (s) are due for refill today: yes for longer DS  Requested medication (s) are on the active medication list: yes  Last refill:  04/01/24 #30/0  Future visit scheduled: yes  Notes to clinic:  Unable to refill per protocol, last refill by another provider. Hospital provider      Requested Prescriptions  Pending Prescriptions Disp Refills   rosuvastatin  (CRESTOR ) 5 MG tablet [Pharmacy Med Name: Rosuvastatin  Calcium  5 MG Oral Tablet] 90 tablet 0    Sig: Take 1 tablet by mouth once daily     Cardiovascular:  Antilipid - Statins 2 Failed - 04/08/2024  9:32 AM      Failed - Cr in normal range and within 360 days    Creatinine, Ser  Date Value Ref Range Status  03/31/2024 3.39 (H) 0.61 - 1.24 mg/dL Final         Failed - Valid encounter within last 12 months    Recent Outpatient Visits   None     Future Appointments             In 1 month Sheron Dixons, MD [facility] Medicine at [facility], [facility]            Failed - Lipid Panel in normal range within the last 12 months    Cholesterol, Total  Date Value Ref Range Status  03/19/2023 146 100 - 199 mg/dL Final   Cholesterol  Date Value Ref Range Status  03/31/2024 140 0 - 200 mg/dL Final   LDL Chol Calc (NIH)  Date Value Ref Range Status  03/19/2023 83 0 - 99 mg/dL Final   LDL Cholesterol  Date Value Ref Range Status  03/31/2024 78 0 - 99 mg/dL Final    Comment:           Total Cholesterol/HDL:CHD Risk Coronary Heart Disease Risk Table                     Men   Women  1/2 Average Risk   3.4   3.3  Average Risk       5.0   4.4  2 X Average Risk   9.6   7.1  3 X Average Risk  23.4   11.0        Use the calculated Patient Ratio above and the CHD Risk Table to determine the patient's CHD Risk.        ATP III CLASSIFICATION (LDL):  <100     mg/dL   Optimal  409-811  mg/dL   Near or Above                    Optimal  130-159  mg/dL   Borderline  914-782  mg/dL    High  >956     mg/dL   Very High Performed at [facility], [address] [place], [place] [address]    HDL  Date Value Ref Range Status  03/31/2024 36 (L) >40 mg/dL Final  65/78/4696 32 (L) >39 mg/dL Final   Triglycerides  Date Value Ref Range Status  03/31/2024 129 <150 mg/dL Final         Passed - Patient is not pregnant      Signed Prescriptions Disp Refills   carvedilol  (COREG ) 25 MG tablet 200 tablet 0    Sig: TAKE 1 TABLET BY MOUTH TWICE DAILY WITH A MEAL     Cardiovascular: Beta Blockers 3 Failed -  04/08/2024  9:32 AM      Failed - Cr in normal range and within 360 days    Creatinine, Ser  Date Value Ref Range Status  03/31/2024 3.39 (H) 0.61 - 1.24 mg/dL Final         Failed - Last BP in normal range    BP Readings from Last 1 Encounters:  04/01/24 (!) 187/95         Failed - Valid encounter within last 6 months    Recent Outpatient Visits   None     Future Appointments             In 1 month Gala Jubilee, Chales [place], MD [facility] Primary Care & Sports Medicine at [facility], [facility]            Passed - AST in normal range and within 360 days    AST  Date Value Ref Range Status  03/30/2024 28 15 - 41 U/L Final         Passed - ALT in normal range and within 360 days    ALT  Date Value Ref Range Status  03/30/2024 36 0 - 44 U/L Final         Passed - Last Heart Rate in normal range    Pulse Readings from Last 1 Encounters:  04/01/24 64

## 2024-04-08 NOTE — Telephone Encounter (Signed)
 Requested Prescriptions  Pending Prescriptions Disp Refills   rosuvastatin  (CRESTOR ) 5 MG tablet [Pharmacy Med Name: Rosuvastatin  Calcium  5 MG Oral Tablet] 90 tablet 0    Sig: Take 1 tablet by mouth once daily     Cardiovascular:  Antilipid - Statins 2 Failed - 04/08/2024  9:32 AM      Failed - Cr in normal range and within 360 days    Creatinine, Ser  Date Value Ref Range Status  03/31/2024 3.39 (H) 0.61 - 1.24 mg/dL Final         Failed - Valid encounter within last 12 months    Recent Outpatient Visits   None     Future Appointments             In 1 month Gala Jubilee, Chales [place], MD [facility] Primary Care & Sports Medicine at [facility], [facility]            Failed - Lipid Panel in normal range within the last 12 months    Cholesterol, Total  Date Value Ref Range Status  03/19/2023 146 100 - 199 mg/dL Final   Cholesterol  Date Value Ref Range Status  03/31/2024 140 0 - 200 mg/dL Final   LDL Chol Calc (NIH)  Date Value Ref Range Status  03/19/2023 83 0 - 99 mg/dL Final   LDL Cholesterol  Date Value Ref Range Status  03/31/2024 78 0 - 99 mg/dL Final    Comment:           Total Cholesterol/HDL:CHD Risk Coronary Heart Disease Risk Table                     Men   Women  1/2 Average Risk   3.4   3.3  Average Risk       5.0   4.4  2 X Average Risk   9.6   7.1  3 X Average Risk  23.4   11.0        Use the calculated Patient Ratio above and the CHD Risk Table to determine the patient's CHD Risk.        ATP III CLASSIFICATION (LDL):  <100     mg/dL   Optimal  409-811  mg/dL   Near or Above                    Optimal  130-159  mg/dL   Borderline  914-782  mg/dL   High  >956     mg/dL   Very High Performed at [facility], [address] [place], [place] [address]    HDL  Date Value Ref Range Status  03/31/2024 36 (L) >40 mg/dL Final  65/78/4696 32 (L) >39 mg/dL Final   Triglycerides  Date Value Ref Range Status  03/31/2024 129 <150 mg/dL Final          Passed - Patient is not pregnant       carvedilol  (COREG ) 25 MG tablet [Pharmacy Med Name: Carvedilol  25 MG Oral Tablet] 200 tablet 0    Sig: TAKE 1 TABLET BY MOUTH TWICE DAILY WITH A MEAL     Cardiovascular: Beta Blockers 3 Failed - 04/08/2024  9:32 AM      Failed - Cr in normal range and within 360 days    Creatinine, Ser  Date Value Ref Range Status  03/31/2024 3.39 (H) 0.61 - 1.24 mg/dL Final         Failed - Last BP in normal  range    BP Readings from Last 1 Encounters:  04/01/24 (!) 187/95         Failed - Valid encounter within last 6 months    Recent Outpatient Visits   None     Future Appointments             In 1 month Gala Jubilee, Chales [place], MD [facility] Primary Care & Sports Medicine at [facility], [facility]            Passed - AST in normal range and within 360 days    AST  Date Value Ref Range Status  03/30/2024 28 15 - 41 U/L Final         Passed - ALT in normal range and within 360 days    ALT  Date Value Ref Range Status  03/30/2024 36 0 - 44 U/L Final         Passed - Last Heart Rate in normal range    Pulse Readings from Last 1 Encounters:  04/01/24 64

## 2024-04-08 NOTE — Progress Notes (Unsigned)
 Date:  04/08/2024   Name:  Tyrone Moore   DOB:  [birth year]   MRN:  [id number]   Chief Complaint: No chief complaint on file.  HPI  Review of Systems   Lab Results  Component Value Date   NA 143 03/31/2024   K 3.9 03/31/2024   CO2 32 03/31/2024   GLUCOSE 90 03/31/2024   BUN 21 03/31/2024   CREATININE 3.39 (H) 03/31/2024   CALCIUM  9.1 03/31/2024   EGFR 12 02/26/2023   GFRNONAA 18 (L) 03/31/2024   Lab Results  Component Value Date   CHOL 140 03/31/2024   HDL 36 (L) 03/31/2024   LDLCALC 78 03/31/2024   TRIG 129 03/31/2024   CHOLHDL 3.9 03/31/2024   Lab Results  Component Value Date   TSH 2.067 03/30/2024   Lab Results  Component Value Date   HGBA1C 5.8 (H) 03/19/2023   Lab Results  Component Value Date   WBC 6.8 03/31/2024   HGB 11.3 (L) 03/31/2024   HCT 35.3 (L) 03/31/2024   MCV 104.1 (H) 03/31/2024   PLT 130 (L) 03/31/2024   Lab Results  Component Value Date   ALT 36 03/30/2024   AST 28 03/30/2024   ALKPHOS 113 03/30/2024   BILITOT 0.7 03/30/2024   No results found for: "25OHVITD2", "25OHVITD3", "VD25OH"   Patient Active Problem List   Diagnosis Date Noted   CVA (cerebral vascular accident) (HCC) 03/30/2024   ESRD on dialysis (HCC) 09/16/2023   Chronic kidney disease (CKD), stage V (HCC) 04/15/2023   Prediabetes 09/17/2022   GERD without esophagitis 09/17/2022   History of stroke 09/01/2021   Anemia in chronic kidney disease 08/15/2021   Cyst of kidney, acquired 08/15/2021   Hyperparathyroidism due to renal insufficiency (HCC) 08/15/2021   Chronic obstructive pulmonary disease, unspecified (HCC) 07/11/2021   Proteinuria, unspecified 05/16/2021   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   Mixed hyperlipidemia 03/26/2021   Acquired hypothyroidism 03/26/2021   Essential hypertension 03/26/2021   Tobacco use disorder 03/26/2021   Chronic bilateral low back pain without sciatica 03/26/2021    No Known Allergies  Past  Surgical History:  Procedure Laterality Date   AV FISTULA PLACEMENT Left 05/01/2023   Procedure: ARTERIOVENOUS (AV) FISTULA CREATION ( RADIOCEPHALIC);  Surgeon: Celso College, MD;  Location: [facility] ORS;  Service: Vascular;  Laterality: Left;   CYSTOSCOPY W/ RETROGRADES Bilateral 07/03/2021   Procedure: CYSTOSCOPY WITH RETROGRADE PYELOGRAM;  Surgeon: Dustin Gimenez, MD;  Location: [facility] OR[facility];  Service: Urology;  Laterality: Bilateral;   CYSTOSCOPY WITH LITHOLAPAXY N/A 07/03/2021   Procedure: CYSTOSCOPY WITH LITHOLAPAXY;  Surgeon: Dustin Gimenez, MD;  Location: [facility] OR[facility];  Service: Urology;  Laterality: N/A;   HOLEP-LASER ENUCLEATION OF THE PROSTATE WITH MORCELLATION N/A 07/03/2021   Procedure: HOLEP-LASER ENUCLEATION OF THE PROSTATE WITH MORCELLATION;  Surgeon: Dustin Gimenez, MD;  Location: [facility] OR[facility];  Service: Urology;  Laterality: N/A;   SPINE SURGERY  2015   X 4 surgery - lumbar disc    Social History   Tobacco Use   Smoking status: Heavy Smoker    Current packs/day: 1.00    Average packs/day: 1 pack/day for 54.0 years (54.0 ttl pk-yrs)    Types: Cigarettes    Passive exposure: Current   Smokeless tobacco: Never  Vaping Use   Vaping status: Never Used  Substance Use Topics   Alcohol use: Not Currently    Comment: quit drinking at 19   Drug use: Never     Medication list has  been reviewed and updated.  No outpatient medications have been marked as taking for the 04/08/24 encounter (Orders Only) with Sheron Dixons, MD.       11/19/2023    3:05 PM 03/19/2023    8:06 AM 09/17/2022    1:19 PM 06/27/2022   10:04 AM  GAD 7 : Generalized Anxiety Score  Nervous, Anxious, on Edge 0 0 0 0  Control/stop worrying 0 0 0 0  Worry too much - different things 1 1 0 0  Trouble relaxing 1 0 0 0  Restless 1 0 0 0  Easily annoyed or irritable 1 1 0 0  Afraid - awful might happen 0 0 0 0  Total GAD 7 Score 4 2 0 0  Anxiety Difficulty Not difficult at all Not difficult at all Not difficult at  all Not difficult at all       11/19/2023    3:05 PM 08/27/2023    9:41 AM 03/19/2023    8:06 AM  Depression screen PHQ 2/9  Decreased Interest 0 0 0  Down, Depressed, Hopeless 1 0 0  PHQ - 2 Score 1 0 0  Altered sleeping 1 0 0  Tired, decreased energy 2 0 1  Change in appetite 1 0 1  Feeling bad or failure about yourself  1 0 0  Trouble concentrating 0 0 0  Moving slowly or fidgety/restless 0 0 0  Suicidal thoughts 0 0 0  PHQ-9 Score 6 0 2  Difficult doing work/chores Not difficult at all Not difficult at all Not difficult at all    BP Readings from Last 3 Encounters:  04/01/24 (!) 187/95  02/26/24 (!) 197/86  11/19/23 138/68    Physical Exam  Wt Readings from Last 3 Encounters:  04/01/24 200 lb 6.4 oz (90.9 kg)  02/26/24 204 lb (92.5 kg)  11/19/23 198 lb 3.2 oz (89.9 kg)    There were no vitals taken for this visit.  Assessment and Plan:  Problem List Items Addressed This Visit   None   No follow-ups on file.    Sheron Dixons, MD [facility] Primary Care and Sports Medicine [place]

## 2024-04-08 NOTE — Telephone Encounter (Signed)
 Medication refill

## 2024-04-10 DIAGNOSIS — Z992 Dependence on renal dialysis: Secondary | ICD-10-CM | POA: Diagnosis not present

## 2024-04-10 DIAGNOSIS — N186 End stage renal disease: Secondary | ICD-10-CM | POA: Diagnosis not present

## 2024-04-13 DIAGNOSIS — Z992 Dependence on renal dialysis: Secondary | ICD-10-CM | POA: Diagnosis not present

## 2024-04-13 DIAGNOSIS — N186 End stage renal disease: Secondary | ICD-10-CM | POA: Diagnosis not present

## 2024-04-15 DIAGNOSIS — N186 End stage renal disease: Secondary | ICD-10-CM | POA: Diagnosis not present

## 2024-04-15 DIAGNOSIS — Z992 Dependence on renal dialysis: Secondary | ICD-10-CM | POA: Diagnosis not present

## 2024-04-17 DIAGNOSIS — Z992 Dependence on renal dialysis: Secondary | ICD-10-CM | POA: Diagnosis not present

## 2024-04-17 DIAGNOSIS — N186 End stage renal disease: Secondary | ICD-10-CM | POA: Diagnosis not present

## 2024-04-20 DIAGNOSIS — N186 End stage renal disease: Secondary | ICD-10-CM | POA: Diagnosis not present

## 2024-04-20 DIAGNOSIS — Z992 Dependence on renal dialysis: Secondary | ICD-10-CM | POA: Diagnosis not present

## 2024-04-22 ENCOUNTER — Telehealth (INDEPENDENT_AMBULATORY_CARE_PROVIDER_SITE_OTHER): Payer: Self-pay

## 2024-04-22 DIAGNOSIS — Z992 Dependence on renal dialysis: Secondary | ICD-10-CM | POA: Diagnosis not present

## 2024-04-22 DIAGNOSIS — N186 End stage renal disease: Secondary | ICD-10-CM | POA: Diagnosis not present

## 2024-04-22 NOTE — Telephone Encounter (Signed)
 Spoke with the patient and he is scheduled with Dr. Vonna Guardian on 04/26/24 with a 6:45 am arrival time to the [facility] for a left arm fistulagram. Pre-procedure instructions were discussed and will be sent to Mychart.

## 2024-04-23 ENCOUNTER — Telehealth (INDEPENDENT_AMBULATORY_CARE_PROVIDER_SITE_OTHER): Payer: Self-pay

## 2024-04-23 NOTE — Telephone Encounter (Addendum)
 Hunter from [facility] left a voice mail stating this patient called her to cancel his procedure for 04/26/24 with Dr. Vonna Guardian. I attempted to contact the patient and was unable to leave a message or speak with the patient. Patient has  been canceled.

## 2024-04-24 DIAGNOSIS — N186 End stage renal disease: Secondary | ICD-10-CM | POA: Diagnosis not present

## 2024-04-24 DIAGNOSIS — Z992 Dependence on renal dialysis: Secondary | ICD-10-CM | POA: Diagnosis not present

## 2024-04-26 ENCOUNTER — Ambulatory Visit: Admission: RE | Admit: 2024-04-26 | Source: Home / Self Care | Admitting: Vascular Surgery

## 2024-04-26 ENCOUNTER — Encounter: Admission: RE | Payer: Self-pay | Source: Home / Self Care

## 2024-04-26 DIAGNOSIS — N186 End stage renal disease: Secondary | ICD-10-CM

## 2024-04-26 SURGERY — A/V FISTULAGRAM
Anesthesia: Moderate Sedation | Laterality: Left

## 2024-04-27 DIAGNOSIS — N186 End stage renal disease: Secondary | ICD-10-CM | POA: Diagnosis not present

## 2024-04-27 DIAGNOSIS — Z992 Dependence on renal dialysis: Secondary | ICD-10-CM | POA: Diagnosis not present

## 2024-05-01 DIAGNOSIS — Z992 Dependence on renal dialysis: Secondary | ICD-10-CM | POA: Diagnosis not present

## 2024-05-01 DIAGNOSIS — N186 End stage renal disease: Secondary | ICD-10-CM | POA: Diagnosis not present

## 2024-05-04 DIAGNOSIS — E1122 Type 2 diabetes mellitus with diabetic chronic kidney disease: Secondary | ICD-10-CM | POA: Diagnosis not present

## 2024-05-04 DIAGNOSIS — Z992 Dependence on renal dialysis: Secondary | ICD-10-CM | POA: Diagnosis not present

## 2024-05-04 DIAGNOSIS — N186 End stage renal disease: Secondary | ICD-10-CM | POA: Diagnosis not present

## 2024-05-06 DIAGNOSIS — N186 End stage renal disease: Secondary | ICD-10-CM | POA: Diagnosis not present

## 2024-05-06 DIAGNOSIS — Z992 Dependence on renal dialysis: Secondary | ICD-10-CM | POA: Diagnosis not present

## 2024-05-08 DIAGNOSIS — N186 End stage renal disease: Secondary | ICD-10-CM | POA: Diagnosis not present

## 2024-05-08 DIAGNOSIS — Z992 Dependence on renal dialysis: Secondary | ICD-10-CM | POA: Diagnosis not present

## 2024-05-10 ENCOUNTER — Other Ambulatory Visit: Payer: Self-pay | Admitting: Internal Medicine

## 2024-05-10 DIAGNOSIS — N186 End stage renal disease: Secondary | ICD-10-CM | POA: Diagnosis not present

## 2024-05-10 DIAGNOSIS — E039 Hypothyroidism, unspecified: Secondary | ICD-10-CM

## 2024-05-10 DIAGNOSIS — Z992 Dependence on renal dialysis: Secondary | ICD-10-CM | POA: Diagnosis not present

## 2024-05-11 DIAGNOSIS — N186 End stage renal disease: Secondary | ICD-10-CM | POA: Diagnosis not present

## 2024-05-11 DIAGNOSIS — Z992 Dependence on renal dialysis: Secondary | ICD-10-CM | POA: Diagnosis not present

## 2024-05-11 NOTE — Telephone Encounter (Signed)
 Requested Prescriptions  Pending Prescriptions Disp Refills   levothyroxine  (SYNTHROID ) 112 MCG tablet [Pharmacy Med Name: Levothyroxine  Sodium 112 MCG Oral Tablet] 90 tablet 0    Sig: TAKE 1 TABLET BY MOUTH BEFORE BREAKFAST     Endocrinology:  Hypothyroid Agents Failed - 05/11/2024  4:12 PM      Failed - Valid encounter within last 12 months    Recent Outpatient Visits   None     Future Appointments             In 1 week [name], MD [facility] Primary Care & Sports Medicine at [facility], [facility]            Passed - TSH in normal range and within 360 days    TSH  Date Value Ref Range Status  03/30/2024 2.067 0.350 - 4.500 uIU/mL Final    Comment:    Performed by a 3rd Generation assay with a functional sensitivity of <=0.01 uIU/mL. Performed at [facility], [address] [place], [place] [address]   03/19/2023 0.712 0.450 - 4.500 uIU/mL Final

## 2024-05-13 DIAGNOSIS — N186 End stage renal disease: Secondary | ICD-10-CM | POA: Diagnosis not present

## 2024-05-13 DIAGNOSIS — Z992 Dependence on renal dialysis: Secondary | ICD-10-CM | POA: Diagnosis not present

## 2024-05-15 DIAGNOSIS — N186 End stage renal disease: Secondary | ICD-10-CM | POA: Diagnosis not present

## 2024-05-15 DIAGNOSIS — Z992 Dependence on renal dialysis: Secondary | ICD-10-CM | POA: Diagnosis not present

## 2024-05-18 ENCOUNTER — Ambulatory Visit: Payer: Self-pay | Admitting: Internal Medicine

## 2024-05-18 DIAGNOSIS — N186 End stage renal disease: Secondary | ICD-10-CM | POA: Diagnosis not present

## 2024-05-18 DIAGNOSIS — Z992 Dependence on renal dialysis: Secondary | ICD-10-CM | POA: Diagnosis not present

## 2024-05-19 ENCOUNTER — Encounter: Payer: Self-pay | Admitting: Internal Medicine

## 2024-05-19 ENCOUNTER — Ambulatory Visit (INDEPENDENT_AMBULATORY_CARE_PROVIDER_SITE_OTHER): Admitting: Internal Medicine

## 2024-05-19 VITALS — BP 138/62 | HR 79 | Ht 71.0 in | Wt 208.0 lb

## 2024-05-19 DIAGNOSIS — Z992 Dependence on renal dialysis: Secondary | ICD-10-CM | POA: Diagnosis not present

## 2024-05-19 DIAGNOSIS — I10 Essential (primary) hypertension: Secondary | ICD-10-CM | POA: Diagnosis not present

## 2024-05-19 DIAGNOSIS — J449 Chronic obstructive pulmonary disease, unspecified: Secondary | ICD-10-CM | POA: Diagnosis not present

## 2024-05-19 DIAGNOSIS — E782 Mixed hyperlipidemia: Secondary | ICD-10-CM

## 2024-05-19 DIAGNOSIS — E039 Hypothyroidism, unspecified: Secondary | ICD-10-CM | POA: Diagnosis not present

## 2024-05-19 DIAGNOSIS — N186 End stage renal disease: Secondary | ICD-10-CM | POA: Diagnosis not present

## 2024-05-19 DIAGNOSIS — F17200 Nicotine dependence, unspecified, uncomplicated: Secondary | ICD-10-CM

## 2024-05-19 MED ORDER — ALBUTEROL SULFATE HFA 108 (90 BASE) MCG/ACT IN AERS: 2.0000 | INHALATION_SPRAY | Freq: Four times a day (QID) | RESPIRATORY_TRACT | 0 refills | Status: DC | PRN

## 2024-05-19 NOTE — Assessment & Plan Note (Signed)
 Stable, tolerating well. VS seeing him for graft evaluation in the near future

## 2024-05-19 NOTE — Assessment & Plan Note (Signed)
 LDL is  Lab Results  Component Value Date   LDLCALC 78 03/31/2024   Current regimen is atorvastatin .  No medication side effects noted. Goal LDL is <70.

## 2024-05-19 NOTE — Assessment & Plan Note (Signed)
 He is still smoking 2 ppd but lets most of them burn up. He tried Nicotine  patches 21 mg otc but they caused hallucinations I recommend he try the lowest dose patch first.

## 2024-05-19 NOTE — Progress Notes (Signed)
 Date:  05/19/2024   Name:  Tyrone Moore   DOB:  Oct 03, 1951   MRN:  [id number]   Chief Complaint: Hypothyroidism and Hyperlipidemia  Hyperlipidemia This is a chronic problem. The problem is controlled. Pertinent negatives include no chest pain or shortness of breath.  Hypertension This is a chronic problem. The problem is controlled. Pertinent negatives include no chest pain, headaches, palpitations or shortness of breath. Past treatments include beta blockers and calcium  channel blockers. The current treatment provides moderate improvement. Hypertensive end-organ damage includes kidney disease. Identifiable causes of hypertension include a thyroid  problem.  Thyroid  Problem Presents for follow-up visit. Patient reports no anxiety, constipation, diarrhea, fatigue or palpitations. His past medical history is significant for hyperlipidemia.  ESRD on HD - doing well, weak afterwards; AVG functioning fairly well. Recent labs reviewed.   Review of Systems  Constitutional:  Positive for appetite change. Negative for chills, fatigue and unexpected weight change.  HENT:  Negative for nosebleeds.   Eyes:  Negative for visual disturbance.  Respiratory:  Negative for cough, chest tightness, shortness of breath and wheezing.   Cardiovascular:  Negative for chest pain, palpitations and leg swelling.  Gastrointestinal:  Negative for abdominal pain, constipation and diarrhea.  Neurological:  Negative for dizziness, weakness, light-headedness and headaches.  Psychiatric/Behavioral:  Negative for dysphoric mood and sleep disturbance. The patient is not nervous/anxious.      Lab Results  Component Value Date   NA 143 03/31/2024   K 3.9 03/31/2024   CO2 32 03/31/2024   GLUCOSE 90 03/31/2024   BUN 21 03/31/2024   CREATININE 3.39 (H) 03/31/2024   CALCIUM  9.1 03/31/2024   EGFR 12 02/26/2023   GFRNONAA 18 (L) 03/31/2024   Lab Results  Component Value Date   CHOL 140 03/31/2024   HDL 36 (L)  03/31/2024   LDLCALC 78 03/31/2024   TRIG 129 03/31/2024   CHOLHDL 3.9 03/31/2024   Lab Results  Component Value Date   TSH 2.067 03/30/2024   Lab Results  Component Value Date   HGBA1C 5.8 (H) 03/19/2023   Lab Results  Component Value Date   WBC 6.8 03/31/2024   HGB 11.3 (L) 03/31/2024   HCT 35.3 (L) 03/31/2024   MCV 104.1 (H) 03/31/2024   PLT 130 (L) 03/31/2024   Lab Results  Component Value Date   ALT 36 03/30/2024   AST 28 03/30/2024   ALKPHOS 113 03/30/2024   BILITOT 0.7 03/30/2024   No results found for: MARIEN BOLLS, VD25OH   Patient Active Problem List   Diagnosis Date Noted   CVA (cerebral vascular accident) (HCC) 03/30/2024   ESRD on dialysis (HCC) 09/16/2023   Chronic kidney disease (CKD), stage V (HCC) 04/15/2023   Prediabetes 09/17/2022   GERD without esophagitis 09/17/2022   History of stroke 09/01/2021   Anemia in chronic kidney disease 08/15/2021   Cyst of kidney, acquired 08/15/2021   Hyperparathyroidism due to renal insufficiency (HCC) 08/15/2021   Chronic obstructive pulmonary disease, unspecified (HCC) 07/11/2021   Proteinuria, unspecified 05/16/2021   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   Mixed hyperlipidemia 03/26/2021   Acquired hypothyroidism 03/26/2021   Essential hypertension 03/26/2021   Tobacco use disorder 03/26/2021   Chronic bilateral low back pain without sciatica 03/26/2021    No Known Allergies  Past Surgical History:  Procedure Laterality Date   AV FISTULA PLACEMENT Left 05/01/2023   Procedure: ARTERIOVENOUS (AV) FISTULA CREATION ( RADIOCEPHALIC);  Surgeon: Marea Selinda RAMAN, MD;  Location: [facility];  Service: Vascular;  Laterality: Left;   CYSTOSCOPY W/ RETROGRADES Bilateral 07/03/2021   Procedure: CYSTOSCOPY WITH RETROGRADE PYELOGRAM;  Surgeon: Penne Knee, MD;  Location: [facility];  Service: Urology;  Laterality: Bilateral;   CYSTOSCOPY WITH LITHOLAPAXY N/A 07/03/2021   Procedure:  CYSTOSCOPY WITH LITHOLAPAXY;  Surgeon: Penne Knee, MD;  Location: [facility];  Service: Urology;  Laterality: N/A;   HOLEP-LASER ENUCLEATION OF THE PROSTATE WITH MORCELLATION N/A 07/03/2021   Procedure: HOLEP-LASER ENUCLEATION OF THE PROSTATE WITH MORCELLATION;  Surgeon: Penne Knee, MD;  Location: [facility];  Service: Urology;  Laterality: N/A;   SPINE SURGERY  2015   X 4 surgery - lumbar disc    Social History   Tobacco Use   Smoking status: Heavy Smoker    Current packs/day: 1.00    Average packs/day: 1 pack/day for 54.0 years (54.0 ttl pk-yrs)    Types: Cigarettes    Passive exposure: Current   Smokeless tobacco: Never  Vaping Use   Vaping status: Never Used  Substance Use Topics   Alcohol use: Not Currently    Comment: quit drinking at 19   Drug use: Never     Medication list has been reviewed and updated.  Current Meds  Medication Sig   acetaminophen  (TYLENOL ) 500 MG tablet Take 1,000 mg by mouth every 6 (six) hours as needed for moderate pain.   albuterol  (VENTOLIN  HFA) 108 (90 Base) MCG/ACT inhaler Inhale 2 puffs into the lungs every 6 (six) hours as needed for wheezing or shortness of breath.   amLODipine  (NORVASC ) 2.5 MG tablet Take 2.5 mg by mouth at bedtime.   carvedilol  (COREG ) 25 MG tablet TAKE 1 TABLET BY MOUTH TWICE DAILY WITH A MEAL   Cholecalciferol  (D3-1000 PO) Take 2,000 Units by mouth at bedtime.   clopidogrel  (PLAVIX ) 75 MG tablet Take 1 tablet (75 mg total) by mouth daily.   ferrous sulfate 325 (65 FE) MG tablet Take 325 mg by mouth at bedtime.   levothyroxine  (SYNTHROID ) 112 MCG tablet TAKE 1 TABLET BY MOUTH BEFORE BREAKFAST   lidocaine -prilocaine  (EMLA ) cream Apply 1 Application topically as directed.   Multiple Vitamins-Minerals (MULTIVITAL-M PO) Take 1 tablet by mouth at bedtime.   omeprazole  (PRILOSEC) 20 MG capsule Take 1 capsule by mouth once daily   ondansetron  (ZOFRAN -ODT) 4 MG disintegrating tablet Take 4 mg by mouth every 6 (six) hours  as needed.   rosuvastatin  (CRESTOR ) 20 MG tablet Take 1 tablet (20 mg total) by mouth at bedtime.       05/19/2024    2:40 PM 11/19/2023    3:05 PM 03/19/2023    8:06 AM 09/17/2022    1:19 PM  GAD 7 : Generalized Anxiety Score  Nervous, Anxious, on Edge 0 0 0 0  Control/stop worrying 0 0 0 0  Worry too much - different things 0 1 1 0  Trouble relaxing 0 1 0 0  Restless 0 1 0 0  Easily annoyed or irritable 0 1 1 0  Afraid - awful might happen 0 0 0 0  Total GAD 7 Score 0 4 2 0  Anxiety Difficulty Not difficult at all Not difficult at all Not difficult at all Not difficult at all       05/19/2024    2:39 PM 11/19/2023    3:05 PM 08/27/2023    9:41 AM  Depression screen PHQ 2/9  Decreased Interest 0 0 0  Down, Depressed, Hopeless 1 1 0  PHQ - 2 Score 1 1 0  Altered sleeping 1 1 0  Tired, decreased energy 1 2 0  Change in appetite 0 1 0  Feeling bad or failure about yourself  0 1 0  Trouble concentrating 0 0 0  Moving slowly or fidgety/restless 0 0 0  Suicidal thoughts 0 0 0  PHQ-9 Score 3 6 0  Difficult doing work/chores Not difficult at all Not difficult at all Not difficult at all    BP Readings from Last 3 Encounters:  05/19/24 138/62  04/01/24 (!) 187/95  02/26/24 (!) 197/86    Physical Exam Constitutional:      General: He is not in acute distress. Cardiovascular:     Rate and Rhythm: Normal rate and regular rhythm.     Heart sounds: No murmur heard.    Arteriovenous access: Left arteriovenous access is present.  Pulmonary:     Breath sounds: Decreased air movement present. Decreased breath sounds present. No wheezing or rhonchi.  Musculoskeletal:     Cervical back: Normal range of motion.     Right lower leg: No edema.     Left lower leg: No edema.  Lymphadenopathy:     Cervical: No cervical adenopathy.  Neurological:     Mental Status: He is alert.  Psychiatric:        Attention and Perception: Attention normal.        Mood and Affect: Mood normal.      Wt Readings from Last 3 Encounters:  05/19/24 208 lb (94.3 kg)  04/01/24 200 lb 6.4 oz (90.9 kg)  02/26/24 204 lb (92.5 kg)    BP 138/62   Pulse 79   Ht 5' 11 (1.803 m)   Wt 208 lb (94.3 kg)   SpO2 96%   BMI 29.01 kg/m   Assessment and Plan:  Problem List Items Addressed This Visit       Unprioritized   Mixed hyperlipidemia (Chronic)   LDL is  Lab Results  Component Value Date   LDLCALC 78 03/31/2024   Current regimen is atorvastatin .  No medication side effects noted. Goal LDL is <70.       Acquired hypothyroidism (Chronic)   Supplemented. Lab Results  Component Value Date   TSH 2.067 03/30/2024         Essential hypertension (Chronic)   Blood pressure is well controlled.  Current medications Coreg  and amlodipine   Will continue same regimen along with efforts to limit dietary sodium.       Tobacco use disorder (Chronic)   He is still smoking 2 ppd but lets most of them burn up. He tried Nicotine  patches 21 mg otc but they caused hallucinations I recommend he try the lowest dose patch first.      Chronic obstructive pulmonary disease, unspecified (HCC) (Chronic)   He has stable baseline shortness of breath, cough and chest tightness. He is not on any inhaled medications due to cost. Recommend at least Albuterol  MDI prn      Relevant Medications   albuterol  (VENTOLIN  HFA) 108 (90 Base) MCG/ACT inhaler   ESRD on dialysis (HCC) - Primary   Stable, tolerating well. VS seeing him for graft evaluation in the near future       Return in about 6 months (around 11/19/2024) for HTN.    Leita HILARIO Adie, MD [facility] [place]

## 2024-05-19 NOTE — Assessment & Plan Note (Signed)
 He has stable baseline shortness of breath, cough and chest tightness. He is not on any inhaled medications due to cost. Recommend at least Albuterol  MDI prn

## 2024-05-19 NOTE — Assessment & Plan Note (Signed)
 Supplemented. Lab Results  Component Value Date   TSH 2.067 03/30/2024

## 2024-05-19 NOTE — Assessment & Plan Note (Signed)
 Blood pressure is well controlled.  Current medications Coreg  and amlodipine   Will continue same regimen along with efforts to limit dietary sodium.

## 2024-05-20 DIAGNOSIS — Z992 Dependence on renal dialysis: Secondary | ICD-10-CM | POA: Diagnosis not present

## 2024-05-20 DIAGNOSIS — N186 End stage renal disease: Secondary | ICD-10-CM | POA: Diagnosis not present

## 2024-05-22 DIAGNOSIS — N186 End stage renal disease: Secondary | ICD-10-CM | POA: Diagnosis not present

## 2024-05-22 DIAGNOSIS — Z992 Dependence on renal dialysis: Secondary | ICD-10-CM | POA: Diagnosis not present

## 2024-05-25 DIAGNOSIS — Z992 Dependence on renal dialysis: Secondary | ICD-10-CM | POA: Diagnosis not present

## 2024-05-25 DIAGNOSIS — N186 End stage renal disease: Secondary | ICD-10-CM | POA: Diagnosis not present

## 2024-05-29 DIAGNOSIS — N186 End stage renal disease: Secondary | ICD-10-CM | POA: Diagnosis not present

## 2024-05-29 DIAGNOSIS — Z992 Dependence on renal dialysis: Secondary | ICD-10-CM | POA: Diagnosis not present

## 2024-06-02 ENCOUNTER — Ambulatory Visit (INDEPENDENT_AMBULATORY_CARE_PROVIDER_SITE_OTHER): Admitting: Nurse Practitioner

## 2024-06-02 ENCOUNTER — Other Ambulatory Visit (INDEPENDENT_AMBULATORY_CARE_PROVIDER_SITE_OTHER)

## 2024-06-02 ENCOUNTER — Encounter (INDEPENDENT_AMBULATORY_CARE_PROVIDER_SITE_OTHER): Payer: Self-pay | Admitting: Nurse Practitioner

## 2024-06-02 VITALS — BP 130/64 | HR 60 | Ht 71.0 in | Wt 207.0 lb

## 2024-06-02 DIAGNOSIS — I10 Essential (primary) hypertension: Secondary | ICD-10-CM

## 2024-06-02 DIAGNOSIS — Z992 Dependence on renal dialysis: Secondary | ICD-10-CM

## 2024-06-02 DIAGNOSIS — T82898A Other specified complication of vascular prosthetic devices, implants and grafts, initial encounter: Secondary | ICD-10-CM

## 2024-06-02 DIAGNOSIS — E782 Mixed hyperlipidemia: Secondary | ICD-10-CM | POA: Diagnosis not present

## 2024-06-02 DIAGNOSIS — N186 End stage renal disease: Secondary | ICD-10-CM | POA: Diagnosis not present

## 2024-06-02 NOTE — H&P (View-Only) (Signed)
 Subjective:    Patient ID: Tyrone Moore, male    DOB: 05/17/1951, 73 y.o.   MRN: [id number] Chief Complaint  Patient presents with   LS 09/16/23 JD/FB. HDA + consult. pain and swelling in left     The patient returns to the office for follow up regarding a problem with their dialysis access.  He notes that he has swelling in his left hand that has been ongoing for the last few months.  It is better than it was initially but the swelling has never completely resolved.  In addition he does note some numbness within the middle 3 fingers of his left hand.  This happened following surgery for his fistula placement.  He notes that the numbness in his hand is constant and it does not worsen with dialysis.  The patient denies redness or swelling at the access site. The patient denies fever or chills at home or while on dialysis.  He was previously scheduled for a fistulogram in June but canceled due to some confusion regarding the procedure.  Duplex ultrasound of the AV access shows a patent access.  The previously noted stenosis is significantly increased compared to last study.  Flow volume today is 900 cc/min (previous flow volume was 661 cc/min) there is an elevated elevation of velocities in the distal forearm.    Review of Systems  Cardiovascular:        Swollen arm  Neurological:  Positive for numbness.  All other systems reviewed and are negative.      Objective:   Physical Exam Vitals reviewed.  HENT:     Head: Normocephalic.  Cardiovascular:     Rate and Rhythm: Normal rate.     Pulses:          Radial pulses are 1+ on the left side.     Arteriovenous access: Left arteriovenous access is present.     Comments: Good thrill and bruit Skin:    General: Skin is warm and dry.  Neurological:     Mental Status: He is alert and oriented to person, place, and time.  Psychiatric:        Mood and Affect: Mood normal.        Behavior: Behavior normal.        Thought Content:  Thought content normal.        Judgment: Judgment normal.     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   Chronic bilateral low back pain without sciatica 03/26/2021   S/p 4 lumbar spine surgeries   Chronic kidney disease (CKD), stage V (HCC)    Chronic obstructive pulmonary disease, unspecified (HCC) 07/11/2021   no inhalers   COVID-19 virus infection 08/31/2021   Cyst of kidney, acquired    Essential hypertension 03/26/2021   GERD (gastroesophageal reflux disease)    Hyperlipidemia    Hyperparathyroidism (HCC)    Hypertension    Mixed hyperlipidemia 03/26/2021   Pre-diabetes    Proteinuria    Thyroid  disease    Tobacco use disorder     Social History   Socioeconomic History   Marital status: Widowed    Spouse name: Not on file   Number  of children: Not on file   Years of education: Not on file   Highest education level: Not on file  Occupational History   Not on file  Tobacco Use   Smoking status: Heavy Smoker    Current packs/day: 1.00    Average packs/day: 1 pack/day for 54.0 years (54.0 ttl pk-yrs)    Types: Cigarettes    Passive exposure: Current   Smokeless tobacco: Never  Vaping Use   Vaping status: Never Used  Substance and Sexual Activity   Alcohol use: Not Currently    Comment: quit drinking at 19   Drug use: Never   Sexual activity: Not Currently    Partners: Female    Birth control/protection: None  Other Topics Concern   Not on file  Social History Narrative   Pt lives with son and his family.   Social Drivers of Corporate investment banker Strain: Low Risk  (08/27/2023)   Overall Financial Resource Strain (CARDIA)    Difficulty of Paying Living Expenses: Not hard at all   Food Insecurity: No Food Insecurity (03/31/2024)   Hunger Vital Sign    Worried About Running Out of Food in the Last Year: Never true    Ran Out of Food in the Last Year: Never true  Transportation Needs: No Transportation Needs (03/31/2024)   PRAPARE - Administrator, Civil Service (Medical): No    Lack of Transportation (Non-Medical): No  Physical Activity: Inactive (08/27/2023)   Exercise Vital Sign    Days of Exercise per Week: 0 days    Minutes of Exercise per Session: 0 min  Stress: No Stress Concern Present (08/27/2023)   [employer] of Occupational Health - Occupational Stress Questionnaire    Feeling of Stress : Not at all  Social Connections: Socially Isolated (03/31/2024)   Social Connection and Isolation Panel    Frequency of Communication with Friends and Family: More than three times a week    Frequency of Social Gatherings with Friends and Family: Three times a week    Attends Religious Services: Never    Active Member of Clubs or Organizations: No    Attends Banker Meetings: Never    Marital Status: Widowed  Intimate Partner Violence: Not At Risk (03/31/2024)   Humiliation, Afraid, Rape, and Kick questionnaire    Fear of Current or Ex-Partner: No    Emotionally Abused: No    Physically Abused: No    Sexually Abused: No    Past Surgical History:  Procedure Laterality Date   AV FISTULA PLACEMENT Left 05/01/2023   Procedure: ARTERIOVENOUS (AV) FISTULA CREATION ( RADIOCEPHALIC);  Surgeon: Marea Selinda RAMAN, MD;  Location: [facility];  Service: Vascular;  Laterality: Left;   CYSTOSCOPY W/ RETROGRADES Bilateral 07/03/2021   Procedure: CYSTOSCOPY WITH RETROGRADE PYELOGRAM;  Surgeon: Penne Knee, MD;  Location: [facility];  Service: Urology;  Laterality: Bilateral;   CYSTOSCOPY WITH LITHOLAPAXY N/A 07/03/2021   Procedure: CYSTOSCOPY WITH LITHOLAPAXY;  Surgeon: Penne Knee, MD;  Location: [facility];  Service: Urology;  Laterality: N/A;    HOLEP-LASER ENUCLEATION OF THE PROSTATE WITH MORCELLATION N/A 07/03/2021   Procedure: HOLEP-LASER ENUCLEATION OF THE PROSTATE WITH MORCELLATION;  Surgeon: Penne Knee, MD;  Location: [facility];  Service: Urology;  Laterality: N/A;   SPINE SURGERY  2015   X 4 surgery - lumbar disc    Family History  Problem Relation Age of Onset   Hypertension Mother    Heart disease Mother    Heart  disease Sister    Hypertension Sister    Prostate cancer Neg Hx    Bladder Cancer Neg Hx    Kidney cancer Neg Hx     No Known Allergies     Latest Ref Rng & Units 03/31/2024    5:06 AM 03/30/2024    6:23 PM 02/26/2024    7:46 PM  CBC  WBC 4.0 - 10.5 K/uL 6.8  8.8  8.1   Hemoglobin 13.0 - 17.0 g/dL 88.6  88.2  88.7   Hematocrit 39.0 - 52.0 % 35.3  36.4  35.0   Platelets 150 - 400 K/uL 130  143  141       CMP     Component Value Date/Time   NA 143 03/31/2024 0506   NA 141 02/26/2023 0000   K 3.9 03/31/2024 0506   CL 104 03/31/2024 0506   CO2 32 03/31/2024 0506   GLUCOSE 90 03/31/2024 0506   BUN 21 03/31/2024 0506   BUN 46 (A) 02/26/2023 0000   CREATININE 3.39 (H) 03/31/2024 0506   CALCIUM  9.1 03/31/2024 0506   PROT 7.5 03/30/2024 1823   PROT 7.0 03/19/2023 0836   ALBUMIN 3.7 03/30/2024 1823   ALBUMIN 4.2 03/19/2023 0836   AST 28 03/30/2024 1823   ALT 36 03/30/2024 1823   ALKPHOS 113 03/30/2024 1823   BILITOT 0.7 03/30/2024 1823   BILITOT 0.3 03/19/2023 0836   EGFR 12 02/26/2023 0000   EGFR 20 (L) 03/26/2021 1453   GFRNONAA 18 (L) 03/31/2024 0506     No results found.     Assessment & Plan:   1. ESRD on dialysis [facility]) (Primary) Recommend:  The patient is experiencing increasing problems with their dialysis access.  Patient should have a fistulagram with the intention for intervention.  The intention for intervention is to restore appropriate flow and prevent thrombosis and possible loss of the access.  As well as improve the quality of dialysis therapy.  The risks,  benefits and alternative therapies were reviewed in detail with the patient.  All questions were answered.  The patient agrees to proceed with angio/intervention.    The patient will follow up with me in the office after the procedure.   2. Essential hypertension Continue antihypertensive medications as already ordered, these medications have been reviewed and there are no changes at this time.  3. Mixed hyperlipidemia Continue statin as ordered and reviewed, no changes at this time  4. Steal syndrome as complication of dialysis access, initial encounter (HCC) The numbness noted in the patient's hand is somewhat concerning for steal syndrome however his symptoms are not entirely consistent with steal syndrome.  Typically it affects the whole hand versus just the middle fingers, in addition it typically worsens during dialysis.  Whereas he has numbness does not.  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     albuterol  (VENTOLIN  HFA) 108 (90 Base) MCG/ACT inhaler Inhale 2 puffs into the lungs every 6 (six) hours as needed for wheezing or shortness of breath. 1 each 0   amLODipine  (NORVASC ) 2.5 MG tablet Take 2.5 mg by mouth at bedtime.     carvedilol  (COREG ) 25 MG tablet TAKE 1 TABLET BY MOUTH TWICE DAILY WITH A  MEAL 200 tablet 0   Cholecalciferol  (D3-1000 PO) Take 2,000 Units by mouth at bedtime.     clopidogrel  (PLAVIX ) 75 MG tablet Take 1 tablet (75 mg total) by mouth daily. 30 tablet 0   ferrous sulfate 325 (65 FE) MG tablet Take 325 mg by mouth at bedtime.     levothyroxine  (SYNTHROID ) 112 MCG tablet TAKE 1 TABLET BY MOUTH BEFORE BREAKFAST 90 tablet 0   lidocaine -prilocaine  (EMLA ) cream Apply 1 Application topically as  directed.     Multiple Vitamins-Minerals (MULTIVITAL-M PO) Take 1 tablet by mouth at bedtime.     omeprazole  (PRILOSEC) 20 MG capsule Take 1 capsule by mouth once daily 90 capsule 1   ondansetron  (ZOFRAN -ODT) 4 MG disintegrating tablet Take 4 mg by mouth every 6 (six) hours as needed.     rosuvastatin  (CRESTOR ) 20 MG tablet Take 1 tablet (20 mg total) by mouth at bedtime. 90 tablet 1   No current facility-administered medications on file prior to visit.    There are no Patient Instructions on file for this visit. No follow-ups on file.   Nahzir Pohle E Gillis Boardley, NP

## 2024-06-02 NOTE — Progress Notes (Signed)
 Subjective:    Patient ID: Tyrone Moore, male    DOB: 05/17/1951, 73 y.o.   MRN: [id number] Chief Complaint  Patient presents with   LS 09/16/23 JD/FB. HDA + consult. pain and swelling in left     The patient returns to the office for follow up regarding a problem with their dialysis access.  He notes that he has swelling in his left hand that has been ongoing for the last few months.  It is better than it was initially but the swelling has never completely resolved.  In addition he does note some numbness within the middle 3 fingers of his left hand.  This happened following surgery for his fistula placement.  He notes that the numbness in his hand is constant and it does not worsen with dialysis.  The patient denies redness or swelling at the access site. The patient denies fever or chills at home or while on dialysis.  He was previously scheduled for a fistulogram in June but canceled due to some confusion regarding the procedure.  Duplex ultrasound of the AV access shows a patent access.  The previously noted stenosis is significantly increased compared to last study.  Flow volume today is 900 cc/min (previous flow volume was 661 cc/min) there is an elevated elevation of velocities in the distal forearm.    Review of Systems  Cardiovascular:        Swollen arm  Neurological:  Positive for numbness.  All other systems reviewed and are negative.      Objective:   Physical Exam Vitals reviewed.  HENT:     Head: Normocephalic.  Cardiovascular:     Rate and Rhythm: Normal rate.     Pulses:          Radial pulses are 1+ on the left side.     Arteriovenous access: Left arteriovenous access is present.     Comments: Good thrill and bruit Skin:    General: Skin is warm and dry.  Neurological:     Mental Status: He is alert and oriented to person, place, and time.  Psychiatric:        Mood and Affect: Mood normal.        Behavior: Behavior normal.        Thought Content:  Thought content normal.        Judgment: Judgment normal.     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   Chronic bilateral low back pain without sciatica 03/26/2021   S/p 4 lumbar spine surgeries   Chronic kidney disease (CKD), stage V (HCC)    Chronic obstructive pulmonary disease, unspecified (HCC) 07/11/2021   no inhalers   COVID-19 virus infection 08/31/2021   Cyst of kidney, acquired    Essential hypertension 03/26/2021   GERD (gastroesophageal reflux disease)    Hyperlipidemia    Hyperparathyroidism (HCC)    Hypertension    Mixed hyperlipidemia 03/26/2021   Pre-diabetes    Proteinuria    Thyroid  disease    Tobacco use disorder     Social History   Socioeconomic History   Marital status: Widowed    Spouse name: Not on file   Number  of children: Not on file   Years of education: Not on file   Highest education level: Not on file  Occupational History   Not on file  Tobacco Use   Smoking status: Heavy Smoker    Current packs/day: 1.00    Average packs/day: 1 pack/day for 54.0 years (54.0 ttl pk-yrs)    Types: Cigarettes    Passive exposure: Current   Smokeless tobacco: Never  Vaping Use   Vaping status: Never Used  Substance and Sexual Activity   Alcohol use: Not Currently    Comment: quit drinking at 19   Drug use: Never   Sexual activity: Not Currently    Partners: Female    Birth control/protection: None  Other Topics Concern   Not on file  Social History Narrative   Pt lives with son and his family.   Social Drivers of Corporate investment banker Strain: Low Risk  (08/27/2023)   Overall Financial Resource Strain (CARDIA)    Difficulty of Paying Living Expenses: Not hard at all   Food Insecurity: No Food Insecurity (03/31/2024)   Hunger Vital Sign    Worried About Running Out of Food in the Last Year: Never true    Ran Out of Food in the Last Year: Never true  Transportation Needs: No Transportation Needs (03/31/2024)   PRAPARE - Administrator, Civil Service (Medical): No    Lack of Transportation (Non-Medical): No  Physical Activity: Inactive (08/27/2023)   Exercise Vital Sign    Days of Exercise per Week: 0 days    Minutes of Exercise per Session: 0 min  Stress: No Stress Concern Present (08/27/2023)   [employer] of Occupational Health - Occupational Stress Questionnaire    Feeling of Stress : Not at all  Social Connections: Socially Isolated (03/31/2024)   Social Connection and Isolation Panel    Frequency of Communication with Friends and Family: More than three times a week    Frequency of Social Gatherings with Friends and Family: Three times a week    Attends Religious Services: Never    Active Member of Clubs or Organizations: No    Attends Banker Meetings: Never    Marital Status: Widowed  Intimate Partner Violence: Not At Risk (03/31/2024)   Humiliation, Afraid, Rape, and Kick questionnaire    Fear of Current or Ex-Partner: No    Emotionally Abused: No    Physically Abused: No    Sexually Abused: No    Past Surgical History:  Procedure Laterality Date   AV FISTULA PLACEMENT Left 05/01/2023   Procedure: ARTERIOVENOUS (AV) FISTULA CREATION ( RADIOCEPHALIC);  Surgeon: Marea Selinda RAMAN, MD;  Location: [facility];  Service: Vascular;  Laterality: Left;   CYSTOSCOPY W/ RETROGRADES Bilateral 07/03/2021   Procedure: CYSTOSCOPY WITH RETROGRADE PYELOGRAM;  Surgeon: Penne Knee, MD;  Location: [facility];  Service: Urology;  Laterality: Bilateral;   CYSTOSCOPY WITH LITHOLAPAXY N/A 07/03/2021   Procedure: CYSTOSCOPY WITH LITHOLAPAXY;  Surgeon: Penne Knee, MD;  Location: [facility];  Service: Urology;  Laterality: N/A;    HOLEP-LASER ENUCLEATION OF THE PROSTATE WITH MORCELLATION N/A 07/03/2021   Procedure: HOLEP-LASER ENUCLEATION OF THE PROSTATE WITH MORCELLATION;  Surgeon: Penne Knee, MD;  Location: [facility];  Service: Urology;  Laterality: N/A;   SPINE SURGERY  2015   X 4 surgery - lumbar disc    Family History  Problem Relation Age of Onset   Hypertension Mother    Heart disease Mother    Heart  disease Sister    Hypertension Sister    Prostate cancer Neg Hx    Bladder Cancer Neg Hx    Kidney cancer Neg Hx     No Known Allergies     Latest Ref Rng & Units 03/31/2024    5:06 AM 03/30/2024    6:23 PM 02/26/2024    7:46 PM  CBC  WBC 4.0 - 10.5 K/uL 6.8  8.8  8.1   Hemoglobin 13.0 - 17.0 g/dL 88.6  88.2  88.7   Hematocrit 39.0 - 52.0 % 35.3  36.4  35.0   Platelets 150 - 400 K/uL 130  143  141       CMP     Component Value Date/Time   NA 143 03/31/2024 0506   NA 141 02/26/2023 0000   K 3.9 03/31/2024 0506   CL 104 03/31/2024 0506   CO2 32 03/31/2024 0506   GLUCOSE 90 03/31/2024 0506   BUN 21 03/31/2024 0506   BUN 46 (A) 02/26/2023 0000   CREATININE 3.39 (H) 03/31/2024 0506   CALCIUM  9.1 03/31/2024 0506   PROT 7.5 03/30/2024 1823   PROT 7.0 03/19/2023 0836   ALBUMIN 3.7 03/30/2024 1823   ALBUMIN 4.2 03/19/2023 0836   AST 28 03/30/2024 1823   ALT 36 03/30/2024 1823   ALKPHOS 113 03/30/2024 1823   BILITOT 0.7 03/30/2024 1823   BILITOT 0.3 03/19/2023 0836   EGFR 12 02/26/2023 0000   EGFR 20 (L) 03/26/2021 1453   GFRNONAA 18 (L) 03/31/2024 0506     No results found.     Assessment & Plan:   1. ESRD on dialysis [facility]) (Primary) Recommend:  The patient is experiencing increasing problems with their dialysis access.  Patient should have a fistulagram with the intention for intervention.  The intention for intervention is to restore appropriate flow and prevent thrombosis and possible loss of the access.  As well as improve the quality of dialysis therapy.  The risks,  benefits and alternative therapies were reviewed in detail with the patient.  All questions were answered.  The patient agrees to proceed with angio/intervention.    The patient will follow up with me in the office after the procedure.   2. Essential hypertension Continue antihypertensive medications as already ordered, these medications have been reviewed and there are no changes at this time.  3. Mixed hyperlipidemia Continue statin as ordered and reviewed, no changes at this time  4. Steal syndrome as complication of dialysis access, initial encounter (HCC) The numbness noted in the patient's hand is somewhat concerning for steal syndrome however his symptoms are not entirely consistent with steal syndrome.  Typically it affects the whole hand versus just the middle fingers, in addition it typically worsens during dialysis.  Whereas he has numbness does not.  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     albuterol  (VENTOLIN  HFA) 108 (90 Base) MCG/ACT inhaler Inhale 2 puffs into the lungs every 6 (six) hours as needed for wheezing or shortness of breath. 1 each 0   amLODipine  (NORVASC ) 2.5 MG tablet Take 2.5 mg by mouth at bedtime.     carvedilol  (COREG ) 25 MG tablet TAKE 1 TABLET BY MOUTH TWICE DAILY WITH A  MEAL 200 tablet 0   Cholecalciferol  (D3-1000 PO) Take 2,000 Units by mouth at bedtime.     clopidogrel  (PLAVIX ) 75 MG tablet Take 1 tablet (75 mg total) by mouth daily. 30 tablet 0   ferrous sulfate 325 (65 FE) MG tablet Take 325 mg by mouth at bedtime.     levothyroxine  (SYNTHROID ) 112 MCG tablet TAKE 1 TABLET BY MOUTH BEFORE BREAKFAST 90 tablet 0   lidocaine -prilocaine  (EMLA ) cream Apply 1 Application topically as  directed.     Multiple Vitamins-Minerals (MULTIVITAL-M PO) Take 1 tablet by mouth at bedtime.     omeprazole  (PRILOSEC) 20 MG capsule Take 1 capsule by mouth once daily 90 capsule 1   ondansetron  (ZOFRAN -ODT) 4 MG disintegrating tablet Take 4 mg by mouth every 6 (six) hours as needed.     rosuvastatin  (CRESTOR ) 20 MG tablet Take 1 tablet (20 mg total) by mouth at bedtime. 90 tablet 1   No current facility-administered medications on file prior to visit.    There are no Patient Instructions on file for this visit. No follow-ups on file.   Nahzir Pohle E Gillis Boardley, NP

## 2024-06-03 DIAGNOSIS — Z992 Dependence on renal dialysis: Secondary | ICD-10-CM | POA: Diagnosis not present

## 2024-06-03 DIAGNOSIS — N186 End stage renal disease: Secondary | ICD-10-CM | POA: Diagnosis not present

## 2024-06-04 ENCOUNTER — Telehealth (INDEPENDENT_AMBULATORY_CARE_PROVIDER_SITE_OTHER): Payer: Self-pay

## 2024-06-04 NOTE — Telephone Encounter (Signed)
 Spoke with the patient and he is scheduled with Dr. Marea on 06/14/24 with a 1:00 pm arrival time to the [facility] for a left arm fistulagram. Pre-procedure instructions were discussed and will sent to Mychart and mailed.

## 2024-06-05 DIAGNOSIS — Z992 Dependence on renal dialysis: Secondary | ICD-10-CM | POA: Diagnosis not present

## 2024-06-05 DIAGNOSIS — N186 End stage renal disease: Secondary | ICD-10-CM | POA: Diagnosis not present

## 2024-06-08 DIAGNOSIS — Z992 Dependence on renal dialysis: Secondary | ICD-10-CM | POA: Diagnosis not present

## 2024-06-08 DIAGNOSIS — N186 End stage renal disease: Secondary | ICD-10-CM | POA: Diagnosis not present

## 2024-06-10 DIAGNOSIS — N186 End stage renal disease: Secondary | ICD-10-CM | POA: Diagnosis not present

## 2024-06-10 DIAGNOSIS — Z992 Dependence on renal dialysis: Secondary | ICD-10-CM | POA: Diagnosis not present

## 2024-06-12 DIAGNOSIS — N186 End stage renal disease: Secondary | ICD-10-CM | POA: Diagnosis not present

## 2024-06-12 DIAGNOSIS — Z992 Dependence on renal dialysis: Secondary | ICD-10-CM | POA: Diagnosis not present

## 2024-06-12 DIAGNOSIS — L03116 Cellulitis of left lower limb: Secondary | ICD-10-CM | POA: Diagnosis not present

## 2024-06-14 ENCOUNTER — Other Ambulatory Visit: Payer: Self-pay

## 2024-06-14 ENCOUNTER — Encounter
Admission: RE | Disposition: A | Discharge status: Home / Self Care | Payer: Self-pay | Source: Home / Self Care | Attending: Vascular Surgery

## 2024-06-14 ENCOUNTER — Encounter: Payer: Self-pay | Admitting: Vascular Surgery

## 2024-06-14 ENCOUNTER — Ambulatory Visit
Admission: RE | Admit: 2024-06-14 | Discharge: 2024-06-14 | Disposition: A | Discharge status: Home / Self Care | Attending: Vascular Surgery | Admitting: Vascular Surgery

## 2024-06-14 DIAGNOSIS — N186 End stage renal disease: Secondary | ICD-10-CM | POA: Diagnosis not present

## 2024-06-14 DIAGNOSIS — T82858A Stenosis of vascular prosthetic devices, implants and grafts, initial encounter: Secondary | ICD-10-CM

## 2024-06-14 DIAGNOSIS — Y832 Surgical operation with anastomosis, bypass or graft as the cause of abnormal reaction of the patient, or of later complication, without mention of misadventure at the time of the procedure: Secondary | ICD-10-CM | POA: Diagnosis not present

## 2024-06-14 DIAGNOSIS — F17210 Nicotine dependence, cigarettes, uncomplicated: Secondary | ICD-10-CM | POA: Diagnosis not present

## 2024-06-14 DIAGNOSIS — Z992 Dependence on renal dialysis: Secondary | ICD-10-CM

## 2024-06-14 DIAGNOSIS — Z79899 Other long term (current) drug therapy: Secondary | ICD-10-CM | POA: Diagnosis not present

## 2024-06-14 DIAGNOSIS — I120 Hypertensive chronic kidney disease with stage 5 chronic kidney disease or end stage renal disease: Secondary | ICD-10-CM | POA: Diagnosis not present

## 2024-06-14 DIAGNOSIS — E782 Mixed hyperlipidemia: Secondary | ICD-10-CM | POA: Insufficient documentation

## 2024-06-14 HISTORY — PX: A/V FISTULAGRAM: CATH118298

## 2024-06-14 LAB — POTASSIUM (ARMC VASCULAR LAB ONLY): Potassium (ARMC vascular lab): 5.2 mmol/L — ABNORMAL HIGH (ref 3.5–5.1)

## 2024-06-14 SURGERY — A/V FISTULAGRAM
Anesthesia: Moderate Sedation

## 2024-06-14 MED ORDER — FENTANYL CITRATE (PF) 100 MCG/2ML IJ SOLN
INTRAMUSCULAR | Status: AC
Start: 2024-06-14 — End: 2024-06-14
  Filled 2024-06-14: qty 2

## 2024-06-14 MED ORDER — CEFAZOLIN SODIUM-DEXTROSE 1-4 GM/50ML-% IV SOLN
1.0000 g | INTRAVENOUS | Status: AC
  Administered 2024-06-14: 1 g via INTRAVENOUS

## 2024-06-14 MED ORDER — LIDOCAINE-EPINEPHRINE (PF) 1 %-1:200000 IJ SOLN
INTRAMUSCULAR | Status: DC | PRN
  Administered 2024-06-14: 5 mL via INTRADERMAL

## 2024-06-14 MED ORDER — HEPARIN (PORCINE) IN NACL 1000-0.9 UT/500ML-% IV SOLN
INTRAVENOUS | Status: DC | PRN
  Administered 2024-06-14: 500 mL

## 2024-06-14 MED ORDER — HYDROMORPHONE HCL 1 MG/ML IJ SOLN: 1.0000 mg | Freq: Once | INTRAMUSCULAR | Status: DC | PRN

## 2024-06-14 MED ORDER — HYDRALAZINE HCL 20 MG/ML IJ SOLN
INTRAMUSCULAR | Status: DC | PRN
  Administered 2024-06-14: 10 mg via INTRAVENOUS

## 2024-06-14 MED ORDER — HYDRALAZINE HCL 20 MG/ML IJ SOLN
INTRAMUSCULAR | Status: AC
  Filled 2024-06-14: qty 1

## 2024-06-14 MED ORDER — SODIUM CHLORIDE 0.9 % IV SOLN: INTRAVENOUS | Status: DC

## 2024-06-14 MED ORDER — FAMOTIDINE 20 MG PO TABS: 40.0000 mg | ORAL_TABLET | Freq: Once | ORAL | Status: DC | PRN

## 2024-06-14 MED ORDER — MIDAZOLAM HCL 2 MG/2ML IJ SOLN
INTRAMUSCULAR | Status: DC | PRN
  Administered 2024-06-14: 2 mg via INTRAVENOUS
  Administered 2024-06-14: .5 mg via INTRAVENOUS

## 2024-06-14 MED ORDER — MIDAZOLAM HCL 2 MG/2ML IJ SOLN
INTRAMUSCULAR | Status: AC
  Filled 2024-06-14: qty 2

## 2024-06-14 MED ORDER — DIPHENHYDRAMINE HCL 50 MG/ML IJ SOLN: 50.0000 mg | Freq: Once | INTRAMUSCULAR | Status: DC | PRN

## 2024-06-14 MED ORDER — HEPARIN SODIUM (PORCINE) 1000 UNIT/ML IJ SOLN
INTRAMUSCULAR | Status: AC
  Filled 2024-06-14: qty 10

## 2024-06-14 MED ORDER — MIDAZOLAM HCL 2 MG/ML PO SYRP: 8.0000 mg | ORAL_SOLUTION | Freq: Once | ORAL | Status: DC | PRN

## 2024-06-14 MED ORDER — CEFAZOLIN SODIUM-DEXTROSE 1-4 GM/50ML-% IV SOLN
INTRAVENOUS | Status: AC
  Filled 2024-06-14: qty 50

## 2024-06-14 MED ORDER — METHYLPREDNISOLONE SODIUM SUCC 125 MG IJ SOLR: 125.0000 mg | Freq: Once | INTRAMUSCULAR | Status: DC | PRN

## 2024-06-14 MED ORDER — FENTANYL CITRATE (PF) 100 MCG/2ML IJ SOLN
INTRAMUSCULAR | Status: DC | PRN
  Administered 2024-06-14: 50 ug via INTRAVENOUS
  Administered 2024-06-14: 25 ug via INTRAVENOUS

## 2024-06-14 MED ORDER — ONDANSETRON HCL 4 MG/2ML IJ SOLN: 4.0000 mg | Freq: Four times a day (QID) | INTRAMUSCULAR | Status: DC | PRN

## 2024-06-14 MED ORDER — IODIXANOL 320 MG/ML IV SOLN
INTRAVENOUS | Status: DC | PRN
  Administered 2024-06-14: 30 mL via INTRAVENOUS

## 2024-06-14 MED ORDER — HEPARIN SODIUM (PORCINE) 10000 UNIT/ML IJ SOLN
INTRAMUSCULAR | Status: DC | PRN
  Administered 2024-06-14: 3000 [IU]

## 2024-06-14 SURGICAL SUPPLY — 11 items
BALLOON DORADO 6X60X80 (BALLOONS) IMPLANT
BALLOON LUTONIX DCB 5X80X130 (BALLOONS) IMPLANT
CATH BEACON 5 .035 40 KMP TP (CATHETERS) IMPLANT
COVER PROBE ULTRASOUND 5X96 (MISCELLANEOUS) IMPLANT
DEVICE PRESTO INFLATION (MISCELLANEOUS) IMPLANT
DRAPE BRACHIAL (DRAPES) IMPLANT
GLIDEWIRE ADV .035X180CM (WIRE) IMPLANT
KIT MICROPUNCTURE VSI 5F STIFF (SHEATH) IMPLANT
PACK ANGIOGRAPHY (CUSTOM PROCEDURE TRAY) ×1 IMPLANT
SHEATH BRITE TIP 6FRX5.5 (SHEATH) IMPLANT
SUT MNCRL AB 4-0 PS2 18 (SUTURE) IMPLANT

## 2024-06-14 NOTE — Interval H&P Note (Signed)
 History and Physical Interval Note:  06/14/2024 12:38 PM  Tyrone Moore.  has presented today for surgery, with the diagnosis of L Arm Fistulagram    End Stage Renal.  The various methods of treatment have been discussed with the patient and family. After consideration of risks, benefits and other options for treatment, the patient has consented to  Procedure(s): A/V Fistulagram (N/A) as a surgical intervention.  The patient's history has been reviewed, patient examined, no change in status, stable for surgery.  I have reviewed the patient's chart and labs.  Questions were answered to the patient's satisfaction.     [name]

## 2024-06-14 NOTE — Discharge Instructions (Signed)
 Fistulagram, Care After Refer to this sheet in the next few weeks. These instructions provide you with information on caring for yourself after your procedure. Your health care provider may also give you more specific instructions. Your treatment has been planned according to current medical practices, but problems sometimes occur. Call your health care provider if you have any problems or questions after your procedure. What can I expect after the procedure? After your procedure, it is typical to have the following: A small amount of discomfort in the area where the catheters were placed. A small amount of bruising around the fistula. Sleepiness and fatigue.  Follow these instructions at home: Rest at home for the day following your procedure. Avoid heavy lifting with that arm for 3 days  Do not drive or operate heavy machinery while taking pain medicine. Take medicines only as directed by your health care provider. Do not take baths, swim, or use a hot tub until your health care provider approves. You may shower 24 hours after the procedure or as directed by your health care provider. There are many different ways to close and cover an incision, including stitches, skin glue, and adhesive strips. Follow your health care provider's instructions on: Incision care. Bandage (dressing) changes and removal. Incision closure removal. Monitor your dialysis fistula carefully. Contact a health care provider if: You have drainage, redness, swelling, or pain at your catheter site. You have a fever. You have chills. Get help right away if: You feel weak. You have trouble balancing. You have trouble moving your arms or legs. Bleeding that you cannot stop You have problems with your speech or vision. You can no longer feel a vibration or buzz when you put your fingers over your dialysis fistula. The limb that was used for the procedure: Swells. Is painful. Is cold. Is discolored, such as blue or  pale white. This information is not intended to replace advice given to you by your health care provider. Make sure you discuss any questions you have with your health care provider. Document Released: 03/14/2014 Document Revised: 04/04/2016 Document Reviewed: 12/17/2013 Elsevier Interactive Patient Education  2018 [employer].

## 2024-06-14 NOTE — Op Note (Signed)
 [facility]    OPERATIVE NOTE   PROCEDURE: 1.   Left radiocephalic arteriovenous fistula cannulation under ultrasound guidance 2.   Left arm fistulagram  3.   Percutaneous transluminal angioplasty of perianastomotic cephalic vein in the distal forearm with 5 mm diameter Lutonix drug-coated and 6 mm diameter high-pressure angioplasty balloon  PRE-OPERATIVE DIAGNOSIS: 1. ESRD 2. 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  There were several cephalic vein branches in this area.  The remainder of the cephalic vein in the forearm was patent proximal to the stenosis.  There was dual outflow in the upper arm through both the basilic and cephalic vein systems that were widely patent.  SPECIMEN(S):  None  CONTRAST: 30 cc  FLUORO TIME: 3.4 minutes  MODERATE CONSCIOUS SEDATION TIME: Approximately 23 minutes with 2.5 mg of Versed  and 75 mcg of Fentanyl    INDICATIONS: Tyrone Moore. is a 73 y.o. male who presents with malfunctioning left radiocephalic arteriovenous fistula.  The patient is scheduled for left arm fistulagram.  The patient is aware the risks include but are not limited to: bleeding, infection, thrombosis of the cannulated access, and possible anaphylactic reaction to the contrast.  The patient is aware of the risks of the procedure and elects to proceed forward.  DESCRIPTION: After full informed written consent was obtained, the patient was brought back to the angiography suite and placed supine upon the angiography table.  The patient was connected to monitoring equipment. Moderate conscious sedation was administered with a face to face encounter with the patient throughout the procedure with my supervision of the RN administering medicines and  monitoring the patient's vital signs and mental status throughout from the start of the procedure until the patient was taken to the recovery room. The left arm was prepped and draped in the standard fashion for a percutaneous access intervention.  Under ultrasound guidance, the proximal forearm portion of the left radiocephalic arteriovenous fistula was cannulated in a retrograde fashion with a micropuncture needle under direct ultrasound guidance where it was patent and a permanent image was performed.  The microwire was advanced into the fistula and the needle was exchanged for the a microsheath.  I then upsized to a 6 Fr Sheath and imaging was performed.  Hand injections were completed to image the access including the central venous system. 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  There were several cephalic vein branches in this area.  The remainder of the cephalic vein in the forearm was patent proximal to the stenosis.  There was dual outflow in the upper arm through both the basilic and cephalic vein systems that were widely patent.  Based on the images, this patient will need intervention to this forearm cephalic vein stenosis in the perianastomotic region. I then gave the patient 3000 units of intravenous heparin .  I then crossed the stenosis with an Advantage wire.  Based on the imaging, a 5 mm x 6 cm Lutonix drug-coated angioplasty balloon was selected.  The balloon was centered around the distal forearm cephalic vein stenosis and inflated to 12 ATM for 1 minute(s).  This was slightly undersized in the cephalic vein portion so a 6 mm diameter by 6 cm length high-pressure angioplasty balloon was then inflated to  14 atm for 1 minute.  On completion imaging, a 20-25% residual stenosis was present.     Based on the completion imaging, no further intervention is necessary.  The wire and balloon were removed from the sheath.  A 4-0 Monocryl purse-string  suture was sewn around the sheath.  The sheath was removed while tying down the suture.  A sterile bandage was applied to the puncture site.  COMPLICATIONS: None  CONDITION: Stable   Selinda Gu  06/14/2024 3:15 PM   This note was created with Dragon Medical transcription system. Any errors in dictation are purely unintentional.

## 2024-06-15 ENCOUNTER — Encounter: Payer: Self-pay | Admitting: Vascular Surgery

## 2024-06-17 DIAGNOSIS — Z992 Dependence on renal dialysis: Secondary | ICD-10-CM | POA: Diagnosis not present

## 2024-06-17 DIAGNOSIS — L03116 Cellulitis of left lower limb: Secondary | ICD-10-CM | POA: Diagnosis not present

## 2024-06-17 DIAGNOSIS — N186 End stage renal disease: Secondary | ICD-10-CM | POA: Diagnosis not present

## 2024-06-19 DIAGNOSIS — L03116 Cellulitis of left lower limb: Secondary | ICD-10-CM | POA: Diagnosis not present

## 2024-06-19 DIAGNOSIS — Z992 Dependence on renal dialysis: Secondary | ICD-10-CM | POA: Diagnosis not present

## 2024-06-19 DIAGNOSIS — N186 End stage renal disease: Secondary | ICD-10-CM | POA: Diagnosis not present

## 2024-06-22 DIAGNOSIS — Z992 Dependence on renal dialysis: Secondary | ICD-10-CM | POA: Diagnosis not present

## 2024-06-22 DIAGNOSIS — N186 End stage renal disease: Secondary | ICD-10-CM | POA: Diagnosis not present

## 2024-06-22 DIAGNOSIS — L03116 Cellulitis of left lower limb: Secondary | ICD-10-CM | POA: Diagnosis not present

## 2024-06-23 ENCOUNTER — Encounter: Payer: Self-pay | Admitting: Internal Medicine

## 2024-06-23 ENCOUNTER — Ambulatory Visit
Admission: RE | Admit: 2024-06-23 | Discharge: 2024-06-23 | Disposition: A | Discharge status: Home / Self Care | Source: Ambulatory Visit | Attending: Internal Medicine | Admitting: Internal Medicine

## 2024-06-23 ENCOUNTER — Ambulatory Visit: Payer: Self-pay | Admitting: Internal Medicine

## 2024-06-23 ENCOUNTER — Other Ambulatory Visit: Payer: Self-pay | Admitting: Internal Medicine

## 2024-06-23 ENCOUNTER — Ambulatory Visit (INDEPENDENT_AMBULATORY_CARE_PROVIDER_SITE_OTHER): Admitting: Internal Medicine

## 2024-06-23 VITALS — BP 124/62 | HR 73 | Resp 12 | Ht 71.0 in | Wt 209.0 lb

## 2024-06-23 DIAGNOSIS — M7989 Other specified soft tissue disorders: Secondary | ICD-10-CM | POA: Diagnosis not present

## 2024-06-23 DIAGNOSIS — L03116 Cellulitis of left lower limb: Secondary | ICD-10-CM

## 2024-06-23 DIAGNOSIS — M79605 Pain in left leg: Secondary | ICD-10-CM | POA: Diagnosis not present

## 2024-06-23 MED ORDER — LEVOFLOXACIN 250 MG PO TABS: ORAL_TABLET | ORAL | 0 refills | Status: DC

## 2024-06-23 NOTE — Progress Notes (Signed)
 Date:  06/23/2024   Name:  Tyrone Moore.   DOB:  1951/05/08   MRN:  [id number]   Chief Complaint: Edema (Swollen thigh on left leg. Not red or hot to touch. Hurts to walk on leg. Never had DVT in past. )  Leg Pain  There was no injury mechanism. The pain is present in the left leg. The quality of the pain is described as aching. The pain is moderate. The pain has been Fluctuating since onset. Associated symptoms include an inability to bear weight. Pertinent negatives include no muscle weakness or numbness. The symptoms are aggravated by movement, palpation and weight bearing. He has tried nothing for the symptoms.  Started about 2 weeks ago and continues to worsen,  he denies injury.  Did not mention this to family until 2 days ago.  Review of Systems  Constitutional:  Negative for chills, fatigue and fever.  Respiratory:  Positive for shortness of breath (stable baseline). Negative for chest tightness and wheezing.   Cardiovascular:  Positive for leg swelling. Negative for chest pain.  Musculoskeletal:  Positive for arthralgias and gait problem.  Neurological:  Negative for numbness.     Lab Results  Component Value Date   NA 143 03/31/2024   K 3.9 03/31/2024   CO2 32 03/31/2024   GLUCOSE 90 03/31/2024   BUN 21 03/31/2024   CREATININE 3.39 (H) 03/31/2024   CALCIUM  9.1 03/31/2024   EGFR 12 02/26/2023   GFRNONAA 18 (L) 03/31/2024   Lab Results  Component Value Date   CHOL 140 03/31/2024   HDL 36 (L) 03/31/2024   LDLCALC 78 03/31/2024   TRIG 129 03/31/2024   CHOLHDL 3.9 03/31/2024   Lab Results  Component Value Date   TSH 2.067 03/30/2024   Lab Results  Component Value Date   HGBA1C 5.8 (H) 03/19/2023   Lab Results  Component Value Date   WBC 6.8 03/31/2024   HGB 11.3 (L) 03/31/2024   HCT 35.3 (L) 03/31/2024   MCV 104.1 (H) 03/31/2024   PLT 130 (L) 03/31/2024   Lab Results  Component Value Date   ALT 36 03/30/2024   AST 28 03/30/2024   ALKPHOS 113  03/30/2024   BILITOT 0.7 03/30/2024   No results found for: MARIEN BOLLS, VD25OH   Patient Active Problem List   Diagnosis Date Noted   CVA (cerebral vascular accident) (HCC) 03/30/2024   ESRD on dialysis (HCC) 09/16/2023   Chronic kidney disease (CKD), stage V (HCC) 04/15/2023   Prediabetes 09/17/2022   GERD without esophagitis 09/17/2022   History of stroke 09/01/2021   Anemia in chronic kidney disease 08/15/2021   Cyst of kidney, acquired 08/15/2021   Hyperparathyroidism due to renal insufficiency (HCC) 08/15/2021   Chronic obstructive pulmonary disease, unspecified (HCC) 07/11/2021   Proteinuria, unspecified 05/16/2021   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   Mixed hyperlipidemia 03/26/2021   Acquired hypothyroidism 03/26/2021   Essential hypertension 03/26/2021   Tobacco use disorder 03/26/2021   Chronic bilateral low back pain without sciatica 03/26/2021    No Known Allergies  Past Surgical History:  Procedure Laterality Date   A/V FISTULAGRAM N/A 06/14/2024   Procedure: A/V Fistulagram;  Surgeon: Marea Selinda RAMAN, MD;  Location: [facility];  Service: Cardiovascular;  Laterality: N/A;   AV FISTULA PLACEMENT Left 05/01/2023   Procedure: ARTERIOVENOUS (AV) FISTULA CREATION ( RADIOCEPHALIC);  Surgeon: Marea Selinda RAMAN, MD;  Location: [facility] ORS;  Service: Vascular;  Laterality: Left;  CYSTOSCOPY W/ RETROGRADES Bilateral 07/03/2021   Procedure: CYSTOSCOPY WITH RETROGRADE PYELOGRAM;  Surgeon: Penne Knee, MD;  Location: [facility] ORS;  Service: Urology;  Laterality: Bilateral;   CYSTOSCOPY WITH LITHOLAPAXY N/A 07/03/2021   Procedure: CYSTOSCOPY WITH LITHOLAPAXY;  Surgeon: Penne Knee, MD;  Location: [facility] OR[facility];  Service: Urology;  Laterality: N/A;   HOLEP-LASER ENUCLEATION OF THE PROSTATE WITH MORCELLATION N/A 07/03/2021   Procedure: HOLEP-LASER ENUCLEATION OF THE PROSTATE WITH MORCELLATION;  Surgeon: Penne Knee, MD;  Location: [facility]  ORS;  Service: Urology;  Laterality: N/A;   SPINE SURGERY  2015   X 4 surgery - lumbar disc    Social History   Tobacco Use   Smoking status: Heavy Smoker    Current packs/day: 1.00    Average packs/day: 1 pack/day for 54.0 years (54.0 ttl pk-yrs)    Types: Cigarettes    Passive exposure: Current   Smokeless tobacco: Never  Vaping Use   Vaping status: Never Used  Substance Use Topics   Alcohol use: Not Currently    Comment: quit drinking at 19   Drug use: Never     Medication list has been reviewed and updated.  Current Meds  Medication Sig   acetaminophen  (TYLENOL ) 500 MG tablet Take 1,000 mg by mouth every 6 (six) hours as needed for moderate pain.   albuterol  (VENTOLIN  HFA) 108 (90 Base) MCG/ACT inhaler Inhale 2 puffs into the lungs every 6 (six) hours as needed for wheezing or shortness of breath.   amLODipine  (NORVASC ) 2.5 MG tablet Take 2.5 mg by mouth at bedtime.   carvedilol  (COREG ) 25 MG tablet TAKE 1 TABLET BY MOUTH TWICE DAILY WITH A MEAL   Cholecalciferol  (D3-1000 PO) Take 2,000 Units by mouth at bedtime.   clopidogrel  (PLAVIX ) 75 MG tablet Take 1 tablet (75 mg total) by mouth daily.   ferrous sulfate  325 (65 FE) MG tablet Take 325 mg by mouth at bedtime.   levothyroxine  (SYNTHROID ) 112 MCG tablet TAKE 1 TABLET BY MOUTH BEFORE BREAKFAST   lidocaine -prilocaine  (EMLA ) cream Apply 1 Application topically as directed.   Multiple Vitamins-Minerals (MULTIVITAL-M PO) Take 1 tablet by mouth at bedtime.   omeprazole  (PRILOSEC) 20 MG capsule Take 1 capsule by mouth once daily   ondansetron  (ZOFRAN -ODT) 4 MG disintegrating tablet Take 4 mg by mouth every 6 (six) hours as needed.   rosuvastatin  (CRESTOR ) 20 MG tablet Take 1 tablet (20 mg total) by mouth at bedtime.       06/23/2024    8:48 AM 05/19/2024    2:40 PM 11/19/2023    3:05 PM 03/19/2023    8:06 AM  GAD 7 : Generalized Anxiety Score  Nervous, Anxious, on Edge 2 0 0 0  Control/stop worrying 2 0 0 0  Worry too much  - different things 2 0 1 1  Trouble relaxing 2 0 1 0  Restless 2 0 1 0  Easily annoyed or irritable 2 0 1 1  Afraid - awful might happen 1 0 0 0  Total GAD 7 Score 13 0 4 2  Anxiety Difficulty Somewhat difficult Not difficult at all Not difficult at all Not difficult at all       06/23/2024    8:48 AM 05/19/2024    2:39 PM 11/19/2023    3:05 PM  Depression screen PHQ 2/9  Decreased Interest 1 0 0  Down, Depressed, Hopeless 1 1 1   PHQ - 2 Score 2 1 1   Altered sleeping 2 1 1   Tired, decreased energy  2 1 2   Change in appetite 2 0 1  Feeling bad or failure about yourself  1 0 1  Trouble concentrating 2 0 0  Moving slowly or fidgety/restless 1 0 0  Suicidal thoughts 0 0 0  PHQ-9 Score 12 3 6   Difficult doing work/chores Not difficult at all Not difficult at all Not difficult at all    BP Readings from Last 3 Encounters:  06/23/24 124/62  06/14/24 (!) 188/85  06/02/24 130/64    Physical Exam Vitals and nursing note reviewed.  Constitutional:      General: He is not in acute distress.    Appearance: Normal appearance. He is well-developed.  HENT:     Head: Normocephalic and atraumatic.  Cardiovascular:     Rate and Rhythm: Normal rate and regular rhythm.     Heart sounds: No murmur heard. Pulmonary:     Effort: Pulmonary effort is normal. No respiratory distress.     Breath sounds: No wheezing or rhonchi.  Musculoskeletal:     Left upper leg: Swelling (and discoloration) present.     Left lower leg: Swelling present.  Skin:    General: Skin is warm and dry.     Findings: No rash.  Neurological:     Mental Status: He is alert and oriented to person, place, and time.  Psychiatric:        Mood and Affect: Mood normal.        Behavior: Behavior normal.     Wt Readings from Last 3 Encounters:  06/23/24 209 lb (94.8 kg)  06/02/24 207 lb (93.9 kg)  05/19/24 208 lb (94.3 kg)    BP 124/62   Pulse 73   Resp 12   Ht 5' 11 (1.803 m)   Wt 209 lb (94.8 kg)   SpO2 96%    BMI 29.15 kg/m   Assessment and Plan:  Problem List Items Addressed This Visit   None Visit Diagnoses       Left leg swelling    -  Primary   likely extensive DVT - will get STAT US  start Eliquis 2.5 mg bid - sample given confirmed Eliquis dosing with Dr. Marcelino   Relevant Orders   US  Venous Img Lower Unilateral Left (DVT)       No follow-ups on file.    Leita HILARIO Adie, MD [facility] [place]

## 2024-06-23 NOTE — Progress Notes (Unsigned)
 Date:  06/23/2024   Name:  Tyrone Moore.   DOB:  [birth year]   MRN:  [id number]   Chief Complaint: No chief complaint on file.  HPI  Review of Systems   Lab Results  Component Value Date   NA 143 03/31/2024   K 3.9 03/31/2024   CO2 32 03/31/2024   GLUCOSE 90 03/31/2024   BUN 21 03/31/2024   CREATININE 3.39 (H) 03/31/2024   CALCIUM  9.1 03/31/2024   EGFR 12 02/26/2023   GFRNONAA 18 (L) 03/31/2024   Lab Results  Component Value Date   CHOL 140 03/31/2024   HDL 36 (L) 03/31/2024   LDLCALC 78 03/31/2024   TRIG 129 03/31/2024   CHOLHDL 3.9 03/31/2024   Lab Results  Component Value Date   TSH 2.067 03/30/2024   Lab Results  Component Value Date   HGBA1C 5.8 (H) 03/19/2023   Lab Results  Component Value Date   WBC 6.8 03/31/2024   HGB 11.3 (L) 03/31/2024   HCT 35.3 (L) 03/31/2024   MCV 104.1 (H) 03/31/2024   PLT 130 (L) 03/31/2024   Lab Results  Component Value Date   ALT 36 03/30/2024   AST 28 03/30/2024   ALKPHOS 113 03/30/2024   BILITOT 0.7 03/30/2024   No results found for: MARIEN BOLLS, VD25OH   Patient Active Problem List   Diagnosis Date Noted   CVA (cerebral vascular accident) (HCC) 03/30/2024   ESRD on dialysis (HCC) 09/16/2023   Chronic kidney disease (CKD), stage V (HCC) 04/15/2023   Prediabetes 09/17/2022   GERD without esophagitis 09/17/2022   History of stroke 09/01/2021   Anemia in chronic kidney disease 08/15/2021   Cyst of kidney, acquired 08/15/2021   Hyperparathyroidism due to renal insufficiency (HCC) 08/15/2021   Chronic obstructive pulmonary disease, unspecified (HCC) 07/11/2021   Proteinuria, unspecified 05/16/2021   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   Mixed hyperlipidemia 03/26/2021   Acquired hypothyroidism 03/26/2021   Essential hypertension 03/26/2021   Tobacco use disorder 03/26/2021   Chronic bilateral low back pain without sciatica 03/26/2021    No Known Allergies  Past  Surgical History:  Procedure Laterality Date   A/V FISTULAGRAM N/A 06/14/2024   Procedure: A/V Fistulagram;  Surgeon: Marea Selinda RAMAN, MD;  Location: [facility];  Service: Cardiovascular;  Laterality: N/A;   AV FISTULA PLACEMENT Left 05/01/2023   Procedure: ARTERIOVENOUS (AV) FISTULA CREATION ( RADIOCEPHALIC);  Surgeon: Marea Selinda RAMAN, MD;  Location: [facility];  Service: Vascular;  Laterality: Left;   CYSTOSCOPY W/ RETROGRADES Bilateral 07/03/2021   Procedure: CYSTOSCOPY WITH RETROGRADE PYELOGRAM;  Surgeon: Penne Knee, MD;  Location: [facility];  Service: Urology;  Laterality: Bilateral;   CYSTOSCOPY WITH LITHOLAPAXY N/A 07/03/2021   Procedure: CYSTOSCOPY WITH LITHOLAPAXY;  Surgeon: Penne Knee, MD;  Location: [facility];  Service: Urology;  Laterality: N/A;   HOLEP-LASER ENUCLEATION OF THE PROSTATE WITH MORCELLATION N/A 07/03/2021   Procedure: HOLEP-LASER ENUCLEATION OF THE PROSTATE WITH MORCELLATION;  Surgeon: Penne Knee, MD;  Location: [facility];  Service: Urology;  Laterality: N/A;   SPINE SURGERY  2015   X 4 surgery - lumbar disc    Social History   Tobacco Use   Smoking status: Heavy Smoker    Current packs/day: 1.00    Average packs/day: 1 pack/day for 54.0 years (54.0 ttl pk-yrs)    Types: Cigarettes    Passive exposure: Current   Smokeless tobacco: Never  Vaping Use   Vaping status: Never Used  Substance Use Topics   Alcohol use: Not Currently    Comment: quit drinking at 19   Drug use: Never     Medication list has been reviewed and updated.  No outpatient medications have been marked as taking for the 06/23/24 encounter (Orders Only) with Tyrone Leita DEL, MD.       06/23/2024    8:48 AM 05/19/2024    2:40 PM 11/19/2023    3:05 PM 03/19/2023    8:06 AM  GAD 7 : Generalized Anxiety Score  Nervous, Anxious, on Edge 2 0 0 0  Control/stop worrying 2 0 0 0  Worry too much - different things 2 0 1 1  Trouble relaxing 2 0 1 0  Restless 2 0 1 0  Easily annoyed  or irritable 2 0 1 1  Afraid - awful might happen 1 0 0 0  Total GAD 7 Score 13 0 4 2  Anxiety Difficulty Somewhat difficult Not difficult at all Not difficult at all Not difficult at all       06/23/2024    8:48 AM 05/19/2024    2:39 PM 11/19/2023    3:05 PM  Depression screen PHQ 2/9  Decreased Interest 1 0 0  Down, Depressed, Hopeless 1 1 1   PHQ - 2 Score 2 1 1   Altered sleeping 2 1 1   Tired, decreased energy 2 1 2   Change in appetite 2 0 1  Feeling bad or failure about yourself  1 0 1  Trouble concentrating 2 0 0  Moving slowly or fidgety/restless 1 0 0  Suicidal thoughts 0 0 0  PHQ-9 Score 12 3 6   Difficult doing work/chores Not difficult at all Not difficult at all Not difficult at all    BP Readings from Last 3 Encounters:  06/23/24 124/62  06/14/24 (!) 188/85  06/02/24 130/64    Physical Exam  Wt Readings from Last 3 Encounters:  06/23/24 209 lb (94.8 kg)  06/02/24 207 lb (93.9 kg)  05/19/24 208 lb (94.3 kg)    There were no vitals taken for this visit.  Assessment and Plan:  Problem List Items Addressed This Visit   None   No follow-ups on file.    Leita HILARIO Justus, MD [facility] [place]

## 2024-06-24 ENCOUNTER — Emergency Department
Admission: EM | Admit: 2024-06-24 | Discharge: 2024-06-24 | Disposition: A | Discharge status: Home / Self Care | Source: Other Acute Inpatient Hospital | Attending: Emergency Medicine | Admitting: Emergency Medicine

## 2024-06-24 ENCOUNTER — Other Ambulatory Visit: Payer: Self-pay

## 2024-06-24 ENCOUNTER — Emergency Department

## 2024-06-24 DIAGNOSIS — N186 End stage renal disease: Secondary | ICD-10-CM | POA: Diagnosis not present

## 2024-06-24 DIAGNOSIS — R600 Localized edema: Secondary | ICD-10-CM | POA: Diagnosis not present

## 2024-06-24 DIAGNOSIS — I120 Hypertensive chronic kidney disease with stage 5 chronic kidney disease or end stage renal disease: Secondary | ICD-10-CM | POA: Insufficient documentation

## 2024-06-24 DIAGNOSIS — R918 Other nonspecific abnormal finding of lung field: Secondary | ICD-10-CM | POA: Diagnosis not present

## 2024-06-24 DIAGNOSIS — L03116 Cellulitis of left lower limb: Secondary | ICD-10-CM | POA: Diagnosis not present

## 2024-06-24 DIAGNOSIS — I959 Hypotension, unspecified: Secondary | ICD-10-CM | POA: Diagnosis not present

## 2024-06-24 DIAGNOSIS — Z992 Dependence on renal dialysis: Secondary | ICD-10-CM | POA: Insufficient documentation

## 2024-06-24 DIAGNOSIS — I10 Essential (primary) hypertension: Secondary | ICD-10-CM | POA: Diagnosis not present

## 2024-06-24 DIAGNOSIS — R0602 Shortness of breath: Secondary | ICD-10-CM | POA: Diagnosis not present

## 2024-06-24 LAB — COMPREHENSIVE METABOLIC PANEL WITH GFR
ALT: 19 U/L (ref 0–44)
AST: 24 U/L (ref 15–41)
Albumin: 3.7 g/dL (ref 3.5–5.0)
Alkaline Phosphatase: 125 U/L (ref 38–126)
Anion gap: 6 (ref 5–15)
BUN: 26 mg/dL — ABNORMAL HIGH (ref 8–23)
CO2: 33 mmol/L — ABNORMAL HIGH (ref 22–32)
Calcium: 9.3 mg/dL (ref 8.9–10.3)
Chloride: 103 mmol/L (ref 98–111)
Creatinine, Ser: 3.86 mg/dL — ABNORMAL HIGH (ref 0.61–1.24)
GFR, Estimated: 16 mL/min — ABNORMAL LOW (ref >60)
Glucose, Bld: 109 mg/dL — ABNORMAL HIGH (ref 70–99)
Potassium: 4.3 mmol/L (ref 3.5–5.1)
Sodium: 142 mmol/L (ref 135–145)
Total Bilirubin: 0.6 mg/dL (ref 0.0–1.2)
Total Protein: 8 g/dL (ref 6.5–8.1)

## 2024-06-24 LAB — CBC WITH DIFFERENTIAL/PLATELET
Abs Immature Granulocytes: 0.02 K/uL (ref 0.00–0.07)
Basophils Absolute: 0 K/uL (ref 0.0–0.1)
Basophils Relative: 1 %
Eosinophils Absolute: 0.3 K/uL (ref 0.0–0.5)
Eosinophils Relative: 4 %
HCT: 37.5 % — ABNORMAL LOW (ref 39.0–52.0)
Hemoglobin: 12 g/dL — ABNORMAL LOW (ref 13.0–17.0)
Immature Granulocytes: 0 %
Lymphocytes Relative: 13 %
Lymphs Abs: 1 K/uL (ref 0.7–4.0)
MCH: 33.2 pg (ref 26.0–34.0)
MCHC: 32 g/dL (ref 30.0–36.0)
MCV: 103.9 fL — ABNORMAL HIGH (ref 80.0–100.0)
Monocytes Absolute: 0.7 K/uL (ref 0.1–1.0)
Monocytes Relative: 9 %
Neutro Abs: 5.7 K/uL (ref 1.7–7.7)
Neutrophils Relative %: 73 %
Platelets: 159 K/uL (ref 150–400)
RBC: 3.61 MIL/uL — ABNORMAL LOW (ref 4.22–5.81)
RDW: 13.1 % (ref 11.5–15.5)
WBC: 7.8 K/uL (ref 4.0–10.5)
nRBC: 0 % (ref 0.0–0.2)

## 2024-06-24 LAB — BRAIN NATRIURETIC PEPTIDE: B Natriuretic Peptide: 334.5 pg/mL — ABNORMAL HIGH (ref 0.0–100.0)

## 2024-06-24 LAB — PROTIME-INR
INR: 1 (ref 0.8–1.2)
Prothrombin Time: 13.9 s (ref 11.4–15.2)

## 2024-06-24 LAB — LACTIC ACID, PLASMA: Lactic Acid, Venous: 1.2 mmol/L (ref 0.5–1.9)

## 2024-06-24 MED ORDER — DOXYCYCLINE HYCLATE 100 MG PO TABS: 100.0000 mg | ORAL_TABLET | Freq: Two times a day (BID) | ORAL | 0 refills | Status: DC

## 2024-06-24 MED ORDER — VANCOMYCIN HCL IN DEXTROSE 1-5 GM/200ML-% IV SOLN
1000.0000 mg | Freq: Once | INTRAVENOUS | Status: AC
  Administered 2024-06-24: 1000 mg via INTRAVENOUS
  Filled 2024-06-24: qty 200

## 2024-06-24 MED ORDER — SODIUM CHLORIDE 0.9 % IV SOLN
2.0000 g | Freq: Once | INTRAVENOUS | Status: AC
  Administered 2024-06-24: 2 g via INTRAVENOUS
  Filled 2024-06-24: qty 20

## 2024-06-24 MED ORDER — CEPHALEXIN 500 MG PO CAPS: 500.0000 mg | ORAL_CAPSULE | Freq: Four times a day (QID) | ORAL | 0 refills | Status: DC

## 2024-06-24 NOTE — ED Notes (Signed)
Pt declined discharge VS.  

## 2024-06-24 NOTE — ED Provider Notes (Signed)
 [facility] Provider Note    Event Date/Time   First MD Initiated Contact with Patient 06/24/24 1222     (approximate)   History   Hypotension   HPI  Tyrone Moore. is a 73 year old male with history of ESRD on HD, HTN presenting to the emergency department for evaluation of hypotension and leg infection.  Patient was at dialysis today when he began to develop hypotension and his treatment was discontinued.  Triage note reports shortness of breath but patient tells me this is not changed from his baseline.  He additionally notes that he has an infection over his left leg for which he saw his primary care doctor.  Had a DVT study that showed a Baker's cyst, but no evidence of DVT.  Was ordered for outpatient Levaquin , but has not yet started this.  No fevers.      Physical Exam   Triage Vital Signs: ED Triage Vitals [06/24/24 1044]  Encounter Vitals Group     BP (!) 141/88     Girls Systolic BP Percentile      Girls Diastolic BP Percentile      Boys Systolic BP Percentile      Boys Diastolic BP Percentile      Pulse Rate 73     Resp 17     Temp 97.9 F (36.6 C)     Temp Source Oral     SpO2 95 %     Weight 208 lb 15.9 oz (94.8 kg)     Height 5' 11 (1.803 m)     Head Circumference      Peak Flow      Pain Score 6     Pain Loc      Pain Education      Exclude from Growth Chart     Most recent vital signs: Vitals:   06/24/24 1044  BP: (!) 141/88  Pulse: 73  Resp: 17  Temp: 97.9 F (36.6 C)  SpO2: 95%     General: Awake, interactive  CV:  Regular rate, good peripheral perfusion.  Resp:  Unlabored respirations, lungs clear to auscultation Abd:  Nondistended, soft, nontender Neuro:  Symmetric facial movement, fluid speech MSK:  Large area of erythema, warmth, swelling of the left lower extremity extending up to the knee with associated tenderness to palpation.  Intact DP pulses and sensation.   ED Results / Procedures /  Treatments   Labs (all labs ordered are listed, but only abnormal results are displayed) Labs Reviewed  COMPREHENSIVE METABOLIC PANEL WITH GFR - Abnormal; Notable for the following components:      Result Value   CO2 33 (*)    Glucose, Bld 109 (*)    BUN 26 (*)    Creatinine, Ser 3.86 (*)    GFR, Estimated 16 (*)    All other components within normal limits  CBC WITH DIFFERENTIAL/PLATELET - Abnormal; Notable for the following components:   RBC 3.61 (*)    Hemoglobin 12.0 (*)    HCT 37.5 (*)    MCV 103.9 (*)    All other components within normal limits  BRAIN NATRIURETIC PEPTIDE - Abnormal; Notable for the following components:   B Natriuretic Peptide 334.5 (*)    All other components within normal limits  CULTURE, BLOOD (ROUTINE X 2)  CULTURE, BLOOD (ROUTINE X 2)  LACTIC ACID, PLASMA  PROTIME-INR  LACTIC ACID, PLASMA  URINALYSIS, W/ REFLEX TO CULTURE (INFECTION SUSPECTED)  EKG EKG independently reviewed and interpreted by myself demonstrates:    RADIOLOGY Imaging independently reviewed and interpreted by myself demonstrates:  CXR with questionable area of pneumonia the patient denying acute cardiopulmonary symptoms DVT ultrasound from yesterday reviewed without evidence of DVT  Formal Radiology Read:  DG Chest 2 View if patient is not in a treatment room. Result Date: 06/24/2024 CLINICAL DATA:  Hypotension, short of breath, suspected sepsis EXAM: CHEST - 2 VIEW COMPARISON:  07/10/2023 FINDINGS: Frontal and lateral views of the chest are obtained on 3 images. The cardiac silhouette is unremarkable. Streaky opacities are seen within the left lower lobe, favor bronchopneumonia given clinical presentation. No effusion or pneumothorax. No acute bony abnormalities. IMPRESSION: 1. Streaky left lower lobe consolidation, compatible with bronchopneumonia. Electronically Signed   By: Ozell Daring M.D.   On: 06/24/2024 11:06   US  Venous Img Lower Unilateral Left (DVT) Result  Date: 06/23/2024 CLINICAL DATA:  LEFT lower extremity swelling and pain. Discoloration of LEFT lateral thigh. EXAM: LEFT LOWER EXTREMITY VENOUS DOPPLER ULTRASOUND TECHNIQUE: Gray-scale sonography with compression, as well as color and duplex ultrasound, were performed to evaluate the deep venous system(s) from the level of the common femoral vein through the popliteal and proximal calf veins. COMPARISON:  None available FINDINGS: VENOUS Normal compressibility of the common femoral, superficial femoral, and popliteal veins, as well as the visualized calf veins. Visualized portions of profunda femoral vein and great saphenous vein unremarkable. No filling defects to suggest DVT on grayscale or color Doppler imaging. Doppler waveforms show normal direction of venous flow, normal respiratory plasticity and response to augmentation. Limited views of the contralateral common femoral vein are unremarkable. OTHER 3.4 x 0.7 x 0.9 cm complex fluid collection in the LEFT popliteal fossa consistent with Baker's cyst. Limitations: none IMPRESSION: 1. No DVT of the left lower extremity. 2. 3.4 x 0.7 x 0.9 cm fluid collection in the LEFT popliteal fossa consistent with Baker's cyst. Electronically Signed   By: Aliene  Mir M.D.   On: 06/23/2024 16:09    PROCEDURES:  Critical Care performed: No  Procedures   MEDICATIONS ORDERED IN ED: Medications  cefTRIAXone  (ROCEPHIN ) 2 g in sodium chloride  0.9 % 100 mL IVPB (0 g Intravenous Stopped 06/24/24 1452)  vancomycin  (VANCOCIN ) IVPB 1000 mg/200 mL premix (0 mg Intravenous Stopped 06/24/24 1452)     IMPRESSION / MDM / ASSESSMENT AND PLAN / ED COURSE  I reviewed the triage vital signs and the nursing notes.  Differential diagnosis includes, but is not limited to, cellulitis, pneumonia, anemia, electrolyte abnormality  Patient's presentation is most consistent with acute presentation with potential threat to life or bodily function.  73 year old male presenting with  hypotension and weakness occurring during dialysis in the setting of recent infection of his leg.  Vital signs fortunately improved here.  Labs with normal white blood cell count, stable anemia.  CMP with elevated BUN and creatinine consistent with known history of ESRD, reassuring potassium at 4.3.  Elevated BNP at 300, but more difficult to interpret in the setting of ESRD.  Normal lactate.  Initial blood culture sent, patient refused second.  With his limited dialysis, extensive area of cellulitis, I did recommend admission for further management of his cellulitis, completion of his dialysis, further monitoring.  Patient adamantly opposed to this.  He understands risks of progressive infection including bacteremia, death.  He was agreeable to stay for a dose of IV antibiotics here.  He was ordered for Rocephin  and vancomycin .  Ordered for every  other day Levaquin  at home, but family was inquiring about alternative antibiotics.  Once again discussed that I recommended that he stay for admission, but I could discharge him on Keflex  and doxycycline  for both strep and MRSA coverage if he did choose to leave AGAINST MEDICAL ADVICE.  He did request this.  He does demonstrate Medical Decision Making capacity.  The patient wants leave against medical advise. I spoke with the patient extensively regarding the risks of leaving prior to completion of an appropriate workup and interventions given their symptomatology. We discussed possibility of significant morbidity and death that may occur as a result of them leaving. The patient expressed understanding of the situation and items mentioned, however would like to leave our care.   I have determined that the patient has the capacity for this medical decision. I have discussed the risks and benefits of the proposed treatment and treatment alternatives including non-treatment. I have offered an open invitation to the patient to return at any time for care. I have  determined that the patient understands this discussion and that the patient willingly assumes the potential risk to their well-being as a result of this informed refusal.      FINAL CLINICAL IMPRESSION(S) / ED DIAGNOSES   Final diagnoses:  Left leg cellulitis     Rx / DC Orders   ED Discharge Orders     None        Note:  This document was prepared using Dragon voice recognition software and may include unintentional dictation errors.   Levander Slate, MD 06/24/24 [phone]

## 2024-06-24 NOTE — Telephone Encounter (Signed)
 Please review and advise.  JM

## 2024-06-24 NOTE — ED Notes (Signed)
 Dr Levander notified of inability to obtain 2nd set of cultures d/t pt not being cooperative.

## 2024-06-24 NOTE — ED Triage Notes (Signed)
 Pt here with hypotension, pt was at dialysis and began being SOB and hypotensive., pt did not finish his treatment. Pt states he has felt bad ever since. Pt also has an infection in his left thigh that is discolored and swollen. Pt was prescribed abx but has not picked them up yet. Pt states the leg is only painful when walking.

## 2024-06-24 NOTE — Discharge Instructions (Signed)
 You were seen in the emergency department today for evaluation of your weakness.  I am concerned about a significant skin infection of your leg.  We recommended that you stay in the hospital, we have chosen to leave AGAINST MEDICAL ADVICE.  I sent a prescription for 2 antibiotics to your pharmacy to try and treat your cellulitis, but is very important that you return if these are not improving your symptoms.  Follow-up with your nephrologist and attend dialysis as directed.  Return to the ER immediately if you develop any new or worsening symptoms.

## 2024-06-26 DIAGNOSIS — N186 End stage renal disease: Secondary | ICD-10-CM | POA: Diagnosis not present

## 2024-06-26 DIAGNOSIS — L03116 Cellulitis of left lower limb: Secondary | ICD-10-CM | POA: Diagnosis not present

## 2024-06-26 DIAGNOSIS — Z992 Dependence on renal dialysis: Secondary | ICD-10-CM | POA: Diagnosis not present

## 2024-06-29 ENCOUNTER — Other Ambulatory Visit: Payer: Self-pay

## 2024-06-29 ENCOUNTER — Encounter: Payer: Self-pay | Admitting: Internal Medicine

## 2024-06-29 ENCOUNTER — Inpatient Hospital Stay
Admission: EM | Admit: 2024-06-29 | Discharge: 2024-07-01 | DRG: 602 | Disposition: A | Discharge status: Home / Self Care | Source: Ambulatory Visit | Attending: Internal Medicine | Admitting: Internal Medicine

## 2024-06-29 ENCOUNTER — Ambulatory Visit (INDEPENDENT_AMBULATORY_CARE_PROVIDER_SITE_OTHER): Admitting: Internal Medicine

## 2024-06-29 VITALS — BP 136/72 | HR 77 | Ht 71.0 in | Wt 208.0 lb

## 2024-06-29 DIAGNOSIS — I120 Hypertensive chronic kidney disease with stage 5 chronic kidney disease or end stage renal disease: Secondary | ICD-10-CM | POA: Diagnosis present

## 2024-06-29 DIAGNOSIS — N138 Other obstructive and reflux uropathy: Secondary | ICD-10-CM | POA: Diagnosis present

## 2024-06-29 DIAGNOSIS — F17210 Nicotine dependence, cigarettes, uncomplicated: Secondary | ICD-10-CM | POA: Diagnosis present

## 2024-06-29 DIAGNOSIS — R7303 Prediabetes: Secondary | ICD-10-CM | POA: Diagnosis present

## 2024-06-29 DIAGNOSIS — N186 End stage renal disease: Secondary | ICD-10-CM | POA: Diagnosis present

## 2024-06-29 DIAGNOSIS — E663 Overweight: Secondary | ICD-10-CM | POA: Diagnosis present

## 2024-06-29 DIAGNOSIS — E782 Mixed hyperlipidemia: Secondary | ICD-10-CM | POA: Diagnosis present

## 2024-06-29 DIAGNOSIS — Z79899 Other long term (current) drug therapy: Secondary | ICD-10-CM | POA: Diagnosis not present

## 2024-06-29 DIAGNOSIS — Z8249 Family history of ischemic heart disease and other diseases of the circulatory system: Secondary | ICD-10-CM | POA: Diagnosis not present

## 2024-06-29 DIAGNOSIS — J449 Chronic obstructive pulmonary disease, unspecified: Secondary | ICD-10-CM | POA: Diagnosis present

## 2024-06-29 DIAGNOSIS — K219 Gastro-esophageal reflux disease without esophagitis: Secondary | ICD-10-CM | POA: Diagnosis present

## 2024-06-29 DIAGNOSIS — E039 Hypothyroidism, unspecified: Secondary | ICD-10-CM | POA: Diagnosis present

## 2024-06-29 DIAGNOSIS — Z992 Dependence on renal dialysis: Secondary | ICD-10-CM

## 2024-06-29 DIAGNOSIS — I10 Essential (primary) hypertension: Secondary | ICD-10-CM | POA: Diagnosis present

## 2024-06-29 DIAGNOSIS — Z6829 Body mass index (BMI) 29.0-29.9, adult: Secondary | ICD-10-CM

## 2024-06-29 DIAGNOSIS — M7122 Synovial cyst of popliteal space [Baker], left knee: Secondary | ICD-10-CM | POA: Diagnosis present

## 2024-06-29 DIAGNOSIS — N189 Chronic kidney disease, unspecified: Secondary | ICD-10-CM | POA: Diagnosis present

## 2024-06-29 DIAGNOSIS — L03116 Cellulitis of left lower limb: Secondary | ICD-10-CM | POA: Diagnosis present

## 2024-06-29 DIAGNOSIS — N401 Enlarged prostate with lower urinary tract symptoms: Secondary | ICD-10-CM | POA: Diagnosis present

## 2024-06-29 DIAGNOSIS — Z7902 Long term (current) use of antithrombotics/antiplatelets: Secondary | ICD-10-CM | POA: Diagnosis not present

## 2024-06-29 DIAGNOSIS — F17200 Nicotine dependence, unspecified, uncomplicated: Secondary | ICD-10-CM | POA: Diagnosis present

## 2024-06-29 DIAGNOSIS — R9389 Abnormal findings on diagnostic imaging of other specified body structures: Secondary | ICD-10-CM | POA: Diagnosis not present

## 2024-06-29 DIAGNOSIS — Z8616 Personal history of COVID-19: Secondary | ICD-10-CM

## 2024-06-29 DIAGNOSIS — E785 Hyperlipidemia, unspecified: Secondary | ICD-10-CM | POA: Diagnosis present

## 2024-06-29 DIAGNOSIS — D696 Thrombocytopenia, unspecified: Secondary | ICD-10-CM | POA: Diagnosis present

## 2024-06-29 DIAGNOSIS — D631 Anemia in chronic kidney disease: Secondary | ICD-10-CM | POA: Diagnosis present

## 2024-06-29 DIAGNOSIS — J441 Chronic obstructive pulmonary disease with (acute) exacerbation: Secondary | ICD-10-CM | POA: Diagnosis present

## 2024-06-29 DIAGNOSIS — Z8673 Personal history of transient ischemic attack (TIA), and cerebral infarction without residual deficits: Secondary | ICD-10-CM | POA: Diagnosis not present

## 2024-06-29 DIAGNOSIS — N2581 Secondary hyperparathyroidism of renal origin: Secondary | ICD-10-CM | POA: Diagnosis not present

## 2024-06-29 LAB — CBC WITH DIFFERENTIAL/PLATELET
Abs Immature Granulocytes: 0.02 K/uL (ref 0.00–0.07)
Basophils Absolute: 0.1 K/uL (ref 0.0–0.1)
Basophils Relative: 1 %
Eosinophils Absolute: 0.4 K/uL (ref 0.0–0.5)
Eosinophils Relative: 5 %
HCT: 34.7 % — ABNORMAL LOW (ref 39.0–52.0)
Hemoglobin: 11.5 g/dL — ABNORMAL LOW (ref 13.0–17.0)
Immature Granulocytes: 0 %
Lymphocytes Relative: 15 %
Lymphs Abs: 1.2 K/uL (ref 0.7–4.0)
MCH: 33.6 pg (ref 26.0–34.0)
MCHC: 33.1 g/dL (ref 30.0–36.0)
MCV: 101.5 fL — ABNORMAL HIGH (ref 80.0–100.0)
Monocytes Absolute: 0.6 K/uL (ref 0.1–1.0)
Monocytes Relative: 8 %
Neutro Abs: 5.4 K/uL (ref 1.7–7.7)
Neutrophils Relative %: 71 %
Platelets: 163 K/uL (ref 150–400)
RBC: 3.42 MIL/uL — ABNORMAL LOW (ref 4.22–5.81)
RDW: 13.1 % (ref 11.5–15.5)
WBC: 7.7 K/uL (ref 4.0–10.5)
nRBC: 0 % (ref 0.0–0.2)

## 2024-06-29 LAB — SEDIMENTATION RATE: Sed Rate: 60 mm/h — ABNORMAL HIGH (ref 0–20)

## 2024-06-29 LAB — COMPREHENSIVE METABOLIC PANEL WITH GFR
ALT: 17 U/L (ref 0–44)
AST: 24 U/L (ref 15–41)
Albumin: 3.4 g/dL — ABNORMAL LOW (ref 3.5–5.0)
Alkaline Phosphatase: 123 U/L (ref 38–126)
Anion gap: 10 (ref 5–15)
BUN: 23 mg/dL (ref 8–23)
CO2: 28 mmol/L (ref 22–32)
Calcium: 9.3 mg/dL (ref 8.9–10.3)
Chloride: 104 mmol/L (ref 98–111)
Creatinine, Ser: 3.38 mg/dL — ABNORMAL HIGH (ref 0.61–1.24)
GFR, Estimated: 19 mL/min — ABNORMAL LOW (ref >60)
Glucose, Bld: 150 mg/dL — ABNORMAL HIGH (ref 70–99)
Potassium: 3.8 mmol/L (ref 3.5–5.1)
Sodium: 142 mmol/L (ref 135–145)
Total Bilirubin: 0.8 mg/dL (ref 0.0–1.2)
Total Protein: 7.6 g/dL (ref 6.5–8.1)

## 2024-06-29 LAB — CULTURE, BLOOD (ROUTINE X 2)
Culture: NO GROWTH
Special Requests: ADEQUATE

## 2024-06-29 LAB — LACTIC ACID, PLASMA: Lactic Acid, Venous: 1.9 mmol/L (ref 0.5–1.9)

## 2024-06-29 MED ORDER — OXYCODONE-ACETAMINOPHEN 5-325 MG PO TABS: 1.0000 | ORAL_TABLET | ORAL | Status: DC | PRN

## 2024-06-29 MED ORDER — AMLODIPINE BESYLATE 5 MG PO TABS
2.5000 mg | ORAL_TABLET | Freq: Every day | ORAL | Status: DC
  Administered 2024-06-29 – 2024-06-30 (×2): 2.5 mg via ORAL
  Filled 2024-06-29 (×2): qty 1

## 2024-06-29 MED ORDER — HYDRALAZINE HCL 20 MG/ML IJ SOLN
5.0000 mg | INTRAMUSCULAR | Status: DC | PRN
  Administered 2024-07-01: 5 mg via INTRAVENOUS
  Filled 2024-06-29: qty 1

## 2024-06-29 MED ORDER — HEPARIN SODIUM (PORCINE) 5000 UNIT/ML IJ SOLN
5000.0000 [IU] | Freq: Three times a day (TID) | INTRAMUSCULAR | Status: DC
  Administered 2024-06-29 – 2024-07-01 (×5): 5000 [IU] via SUBCUTANEOUS
  Filled 2024-06-29 (×5): qty 1

## 2024-06-29 MED ORDER — ACETAMINOPHEN 325 MG PO TABS: 650.0000 mg | ORAL_TABLET | Freq: Four times a day (QID) | ORAL | Status: DC | PRN

## 2024-06-29 MED ORDER — SODIUM CHLORIDE 0.9 % IV SOLN
2.0000 g | INTRAVENOUS | Status: DC
  Administered 2024-06-29 – 2024-06-30 (×2): 2 g via INTRAVENOUS
  Filled 2024-06-29 (×3): qty 20

## 2024-06-29 MED ORDER — DM-GUAIFENESIN ER 30-600 MG PO TB12: 1.0000 | ORAL_TABLET | Freq: Two times a day (BID) | ORAL | Status: DC | PRN

## 2024-06-29 MED ORDER — VANCOMYCIN HCL 2000 MG/400ML IV SOLN
2000.0000 mg | Freq: Once | INTRAVENOUS | Status: AC
  Administered 2024-06-29: 2000 mg via INTRAVENOUS
  Filled 2024-06-29: qty 400

## 2024-06-29 MED ORDER — ONDANSETRON HCL 4 MG/2ML IJ SOLN
4.0000 mg | Freq: Three times a day (TID) | INTRAMUSCULAR | Status: DC | PRN
  Administered 2024-07-01: 4 mg via INTRAVENOUS

## 2024-06-29 MED ORDER — ALBUTEROL SULFATE (2.5 MG/3ML) 0.083% IN NEBU
2.5000 mg | INHALATION_SOLUTION | RESPIRATORY_TRACT | Status: DC | PRN
  Administered 2024-06-29: 2.5 mg via RESPIRATORY_TRACT
  Filled 2024-06-29: qty 3

## 2024-06-29 MED ORDER — NICOTINE 21 MG/24HR TD PT24
21.0000 mg | MEDICATED_PATCH | Freq: Every day | TRANSDERMAL | Status: DC
  Administered 2024-06-29 – 2024-07-01 (×3): 21 mg via TRANSDERMAL
  Filled 2024-06-29 (×3): qty 1

## 2024-06-29 MED ORDER — PANTOPRAZOLE SODIUM 40 MG PO TBEC
40.0000 mg | DELAYED_RELEASE_TABLET | Freq: Every day | ORAL | Status: DC
  Administered 2024-06-29 – 2024-07-01 (×3): 40 mg via ORAL
  Filled 2024-06-29 (×3): qty 1

## 2024-06-29 MED ORDER — ROSUVASTATIN CALCIUM 10 MG PO TABS
20.0000 mg | ORAL_TABLET | Freq: Every day | ORAL | Status: DC
  Administered 2024-06-29 – 2024-06-30 (×2): 20 mg via ORAL
  Filled 2024-06-29: qty 1
  Filled 2024-06-29 (×2): qty 2

## 2024-06-29 MED ORDER — LEVOTHYROXINE SODIUM 112 MCG PO TABS
112.0000 ug | ORAL_TABLET | Freq: Every day | ORAL | Status: DC
  Administered 2024-06-30 – 2024-07-01 (×2): 112 ug via ORAL
  Filled 2024-06-29 (×2): qty 1

## 2024-06-29 MED ORDER — VITAMIN D 25 MCG (1000 UNIT) PO TABS
2000.0000 [IU] | ORAL_TABLET | Freq: Every day | ORAL | Status: DC
  Administered 2024-06-29 – 2024-07-01 (×3): 2000 [IU] via ORAL
  Filled 2024-06-29 (×3): qty 2

## 2024-06-29 MED ORDER — CARVEDILOL 25 MG PO TABS
25.0000 mg | ORAL_TABLET | Freq: Two times a day (BID) | ORAL | Status: DC
  Administered 2024-06-30 – 2024-07-01 (×3): 25 mg via ORAL
  Filled 2024-06-29 (×3): qty 1

## 2024-06-29 MED ORDER — FERROUS SULFATE 325 (65 FE) MG PO TABS
325.0000 mg | ORAL_TABLET | Freq: Every day | ORAL | Status: DC
  Administered 2024-06-29 – 2024-06-30 (×2): 325 mg via ORAL
  Filled 2024-06-29 (×2): qty 1

## 2024-06-29 MED ORDER — VANCOMYCIN HCL IN DEXTROSE 1-5 GM/200ML-% IV SOLN: 1000.0000 mg | Freq: Once | INTRAVENOUS | Status: DC

## 2024-06-29 MED ORDER — VANCOMYCIN HCL 1500 MG/300ML IV SOLN: 1500.0000 mg | INTRAVENOUS | Status: DC

## 2024-06-29 NOTE — ED Notes (Signed)
 Pt noted to continue to have wheezing. Pts son at beside reports that patient breaths like this normally. Pt reports that he is an everyday smoker and is not going to quit.

## 2024-06-29 NOTE — ED Notes (Signed)
 Pt reports L leg has been swollen for 3 weeks. Pt states he has already taken abx for it. Pt states he does not feel pain in leg unless putting weight on it.

## 2024-06-29 NOTE — Progress Notes (Signed)
 Date:  06/29/2024   Name:  Tyrone Moore.   DOB:  1950/11/27   MRN:  [id number]   Chief Complaint: Edema and Pneumonia  HPI  Review of Systems   Lab Results  Component Value Date   NA 142 06/24/2024   K 4.3 06/24/2024   CO2 33 (H) 06/24/2024   GLUCOSE 109 (H) 06/24/2024   BUN 26 (H) 06/24/2024   CREATININE 3.86 (H) 06/24/2024   CALCIUM  9.3 06/24/2024   EGFR 12 02/26/2023   GFRNONAA 16 (L) 06/24/2024   Lab Results  Component Value Date   CHOL 140 03/31/2024   HDL 36 (L) 03/31/2024   LDLCALC 78 03/31/2024   TRIG 129 03/31/2024   CHOLHDL 3.9 03/31/2024   Lab Results  Component Value Date   TSH 2.067 03/30/2024   Lab Results  Component Value Date   HGBA1C 5.8 (H) 03/19/2023   Lab Results  Component Value Date   WBC 7.8 06/24/2024   HGB 12.0 (L) 06/24/2024   HCT 37.5 (L) 06/24/2024   MCV 103.9 (H) 06/24/2024   PLT 159 06/24/2024   Lab Results  Component Value Date   ALT 19 06/24/2024   AST 24 06/24/2024   ALKPHOS 125 06/24/2024   BILITOT 0.6 06/24/2024   No results found for: MARIEN BOLLS, VD25OH   Patient Active Problem List   Diagnosis Date Noted   CVA (cerebral vascular accident) (HCC) 03/30/2024   ESRD on dialysis (HCC) 09/16/2023   Chronic kidney disease (CKD), stage V (HCC) 04/15/2023   Prediabetes 09/17/2022   GERD without esophagitis 09/17/2022   History of stroke 09/01/2021   Anemia in chronic kidney disease 08/15/2021   Cyst of kidney, acquired 08/15/2021   Hyperparathyroidism due to renal insufficiency (HCC) 08/15/2021   Chronic obstructive pulmonary disease, unspecified (HCC) 07/11/2021   Proteinuria, unspecified 05/16/2021   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   Mixed hyperlipidemia 03/26/2021   Acquired hypothyroidism 03/26/2021   Essential hypertension 03/26/2021   Tobacco use disorder 03/26/2021   Chronic bilateral low back pain without sciatica 03/26/2021    No Known Allergies  Past  Surgical History:  Procedure Laterality Date   A/V FISTULAGRAM N/A 06/14/2024   Procedure: A/V Fistulagram;  Surgeon: Marea Selinda RAMAN, MD;  Location: [facility];  Service: Cardiovascular;  Laterality: N/A;   AV FISTULA PLACEMENT Left 05/01/2023   Procedure: ARTERIOVENOUS (AV) FISTULA CREATION ( RADIOCEPHALIC);  Surgeon: Marea Selinda RAMAN, MD;  Location: [facility];  Service: Vascular;  Laterality: Left;   CYSTOSCOPY W/ RETROGRADES Bilateral 07/03/2021   Procedure: CYSTOSCOPY WITH RETROGRADE PYELOGRAM;  Surgeon: Penne Knee, MD;  Location: [facility];  Service: Urology;  Laterality: Bilateral;   CYSTOSCOPY WITH LITHOLAPAXY N/A 07/03/2021   Procedure: CYSTOSCOPY WITH LITHOLAPAXY;  Surgeon: Penne Knee, MD;  Location: [facility];  Service: Urology;  Laterality: N/A;   HOLEP-LASER ENUCLEATION OF THE PROSTATE WITH MORCELLATION N/A 07/03/2021   Procedure: HOLEP-LASER ENUCLEATION OF THE PROSTATE WITH MORCELLATION;  Surgeon: Penne Knee, MD;  Location: [facility];  Service: Urology;  Laterality: N/A;   SPINE SURGERY  2015   X 4 surgery - lumbar disc    Social History   Tobacco Use   Smoking status: Heavy Smoker    Current packs/day: 1.00    Average packs/day: 1 pack/day for 54.0 years (54.0 ttl pk-yrs)    Types: Cigarettes    Passive exposure: Current   Smokeless tobacco: Never  Vaping Use   Vaping status: Never Used  Substance Use Topics   Alcohol use: Not Currently    Comment: quit drinking at 19   Drug use: Never     Medication list has been reviewed and updated.  Current Meds  Medication Sig   acetaminophen  (TYLENOL ) 500 MG tablet Take 1,000 mg by mouth every 6 (six) hours as needed for moderate pain.   albuterol  (VENTOLIN  HFA) 108 (90 Base) MCG/ACT inhaler Inhale 2 puffs into the lungs every 6 (six) hours as needed for wheezing or shortness of breath.   amLODipine  (NORVASC ) 2.5 MG tablet Take 2.5 mg by mouth at bedtime.   carvedilol  (COREG ) 25 MG tablet TAKE 1 TABLET BY MOUTH  TWICE DAILY WITH A MEAL   cephALEXin  (KEFLEX ) 500 MG capsule Take 1 capsule (500 mg total) by mouth 4 (four) times daily for 7 days.   Cholecalciferol  (D3-1000 PO) Take 2,000 Units by mouth at bedtime.   clopidogrel  (PLAVIX ) 75 MG tablet Take 1 tablet (75 mg total) by mouth daily.   doxycycline  (VIBRA -TABS) 100 MG tablet Take 1 tablet (100 mg total) by mouth 2 (two) times daily for 7 days.   ferrous sulfate  325 (65 FE) MG tablet Take 325 mg by mouth at bedtime.   levofloxacin  (LEVAQUIN ) 250 MG tablet Take 2 tablet after dialysis on 8/14 the one tablet every 2 days until gone   levothyroxine  (SYNTHROID ) 112 MCG tablet TAKE 1 TABLET BY MOUTH BEFORE BREAKFAST   lidocaine -prilocaine  (EMLA ) cream Apply 1 Application topically as directed.   Multiple Vitamins-Minerals (MULTIVITAL-M PO) Take 1 tablet by mouth at bedtime.   omeprazole  (PRILOSEC) 20 MG capsule Take 1 capsule by mouth once daily   ondansetron  (ZOFRAN -ODT) 4 MG disintegrating tablet Take 4 mg by mouth every 6 (six) hours as needed.   rosuvastatin  (CRESTOR ) 20 MG tablet Take 1 tablet (20 mg total) by mouth at bedtime.       06/29/2024    1:35 PM 06/23/2024    8:48 AM 05/19/2024    2:40 PM 11/19/2023    3:05 PM  GAD 7 : Generalized Anxiety Score  Nervous, Anxious, on Edge 2 2 0 0  Control/stop worrying 2 2 0 0  Worry too much - different things 2 2 0 1  Trouble relaxing 2 2 0 1  Restless 2 2 0 1  Easily annoyed or irritable 2 2 0 1  Afraid - awful might happen 1 1 0 0  Total GAD 7 Score 13 13 0 4  Anxiety Difficulty Somewhat difficult Somewhat difficult Not difficult at all Not difficult at all       06/29/2024    1:34 PM 06/23/2024    8:48 AM 05/19/2024    2:39 PM  Depression screen PHQ 2/9  Decreased Interest 1 1 0  Down, Depressed, Hopeless 1 1 1   PHQ - 2 Score 2 2 1   Altered sleeping 1 2 1   Tired, decreased energy 1 2 1   Change in appetite 1 2 0  Feeling bad or failure about yourself  1 1 0  Trouble concentrating 1 2 0   Moving slowly or fidgety/restless 1 1 0  Suicidal thoughts 0 0 0  PHQ-9 Score 8 12 3   Difficult doing work/chores Somewhat difficult Not difficult at all Not difficult at all    BP Readings from Last 3 Encounters:  06/29/24 136/72  06/24/24 (!) 141/88  06/23/24 124/62    Physical Exam Constitutional:      General: He is not in acute distress.  Appearance: Normal appearance.  Cardiovascular:     Rate and Rhythm: Normal rate and regular rhythm.  Pulmonary:     Effort: Pulmonary effort is normal.     Breath sounds: No wheezing or rhonchi.  Musculoskeletal:        General: Swelling and tenderness present.     Left lower leg: Edema (with redness and warmth from the foot to the mid thigh) present.  Skin:    Capillary Refill: Capillary refill takes less than 2 seconds.     Findings: Erythema present.  Neurological:     Mental Status: He is alert.   Blood culture x 1 done in ED was negative.  Wt Readings from Last 3 Encounters:  06/29/24 208 lb (94.3 kg)  06/24/24 208 lb 15.9 oz (94.8 kg)  06/23/24 209 lb (94.8 kg)    BP 136/72   Pulse 77   Ht 5' 11 (1.803 m)   Wt 208 lb (94.3 kg)   SpO2 95%   BMI 29.01 kg/m   Assessment and Plan:  Problem List Items Addressed This Visit   None Visit Diagnoses       Cellulitis of left lower extremity    -  Primary   redness of the leg is extending despite oral antibiotics. recommend he present to the ED for admission and IV antibiotics     Abnormal CXR       linear atelectasis of left base should be covered by current antibiotics       No follow-ups on file.    Leita HILARIO Adie, MD [facility] [place]

## 2024-06-29 NOTE — ED Provider Notes (Signed)
 [facility] Provider Note    Event Date/Time   First MD Initiated Contact with Patient 06/29/24 1820     (approximate)  History   Chief Complaint: Leg Pain  HPI  Noe Goyer. is a 73 y.o. male with a past medical history of ESRD on HD Tuesday/Thursday/Saturday, hypertension, hyperlipidemia, presents the emergency department for worsening swelling redness and pain in the left lower extremity.  According to the patient for the past 2 weeks or so he has been experiencing swelling and pain in left lower extremity.  Patient was seen here on 06/23/2024 had an ultrasound that was negative for DVT at that time did show a Baker's cyst.  Patient states he was placed on antibiotics he has been taking antibiotics at home over the last 5 days however the swelling pain and redness have all worsened.  Patient saw his doctor today who referred him back to the emergency department.  Patient did get a full dialysis session today.   Physical Exam   Triage Vital Signs: ED Triage Vitals  Encounter Vitals Group     BP 06/29/24 1626 (!) 153/62     Girls Systolic BP Percentile --      Girls Diastolic BP Percentile --      Boys Systolic BP Percentile --      Boys Diastolic BP Percentile --      Pulse Rate 06/29/24 1626 81     Resp 06/29/24 1626 19     Temp 06/29/24 1626 98.1 F (36.7 C)     Temp Source 06/29/24 1626 Oral     SpO2 06/29/24 1626 98 %     Weight 06/29/24 1627 208 lb (94.3 kg)     Height 06/29/24 1627 5' 11 (1.803 m)     Head Circumference --      Peak Flow --      Pain Score 06/29/24 1627 0     Pain Loc --      Pain Education --      Exclude from Growth Chart --     Most recent vital signs: Vitals:   06/29/24 1626  BP: (!) 153/62  Pulse: 81  Resp: 19  Temp: 98.1 F (36.7 C)  SpO2: 98%    General: Awake, no distress.  CV:  Good peripheral perfusion.  Regular rate and rhythm  Resp:  Normal effort.  Equal breath sounds bilaterally.  Abd:  No  distention.  Soft, nontender.  No rebound or guarding. Other:  Patient has moderate swelling of the left lower extremity with mild swelling of the right lower extremity.  Significant erythema of the left lower extremity extending proximal to the knee down through the foot.  Tenderness throughout several areas of wounds which appear to be consistent with small blisters.  ED Results / Procedures / Treatments   MEDICATIONS ORDERED IN ED: Medications  vancomycin  (VANCOCIN ) IVPB 1000 mg/200 mL premix (has no administration in time range)     IMPRESSION / MDM / ASSESSMENT AND PLAN / ED COURSE  I reviewed the triage vital signs and the nursing notes.  Patient's presentation is most consistent with acute presentation with potential threat to life or bodily function.  Patient presents emergency department for worsening left lower extremity erythema swelling and pain.  Patient has finished a course of outpatient antibiotics with no improvement in fact the symptoms have been worsening per patient.  Had an ultrasound performed 6 days ago that was negative for DVT.  Patient's  lab work today shows a reassuring CBC with a normal white blood cell count, overall reassuring chemistry, just completed dialysis today.  Patient's lactic acid is elevated at 1.9.  Given the patient's worsening redness swelling pain despite outpatient oral antibiotics we will send blood cultures and start the patient on IV antibiotics.  Will admit to the hospital service.  Given the negative ultrasound 6 days ago I do not believe patient necessarily needs a repeat ultrasound at this time.  FINAL CLINICAL IMPRESSION(S) / ED DIAGNOSES   Left leg pain Left leg cellulitis   Note:  This document was prepared using Dragon voice recognition software and may include unintentional dictation errors.   Dorothyann Drivers, MD 06/29/24 [id number]

## 2024-06-29 NOTE — Consult Note (Signed)
 Pharmacy Antibiotic Note  Tyrone Moore. is a 73 y.o. male admitted on 06/29/2024 with LLE cellulitis.  Pharmacy has been consulted for vancomycin  dosing.  Scr 3.38 appears to be close to baseline  Plan: Give Vancomycin  2000 mg IV x 1, then start vancomycin  1500 mg IV every 48 hours Estimated AUC 505, Cmin 12.3 IBW, Scr 3.38, Vd 0.72 Check vancomycin  levels at steady state or as clinically indicated Ceftriaxone  2 grams IV every 24 hours per provider Follow renal function and cultures for adjustments  Height: 5' 11 (180.3 cm) Weight: 94.3 kg (208 lb) IBW/kg (Calculated) : 75.3  Temp (24hrs), Avg:98.1 F (36.7 C), Min:98.1 F (36.7 C), Max:98.1 F (36.7 C)  Recent Labs  Lab 06/24/24 1049 06/24/24 1050 06/29/24 1631  WBC 7.8  --  7.7  CREATININE 3.86*  --  3.38*  LATICACIDVEN  --  1.2 1.9    Estimated Creatinine Clearance: 23.2 mL/min (A) (by C-G formula based on SCr of 3.38 mg/dL (H)).    No Known Allergies  Antimicrobials this admission: Vancomycin  8/19 >>  Ceftriaxone  8/19 >>   Dose adjustments this admission: N/A  Microbiology results: 8/19 BCx: pending   Thank you for allowing pharmacy to be a part of this patient's care.  Tyrone Moore, PharmD, BCPS 06/29/2024 7:07 PM

## 2024-06-29 NOTE — H&P (Signed)
 History and Physical    Tyrone Moore. FMW:[id number] DOB: 06/25/1951 DOA: 06/29/2024  Referring MD/NP/PA:   PCP: [name], MD   Patient coming from:  The patient is coming from home.     Chief Complaint: left lower leg swelling, redness and pain  HPI: Tyrone Moore. is a 73 y.o. male with medical history significant of ESRD-HD (TTS), HTN, HLD, stroke, COPD, GERD, hypothyroidism, BPH, chronic back pain, tobacco abuse, anemia, who presents with left lower leg swelling, redness and pain.  Patient states that he has left lower leg swelling, redness and pain for more than 3 weeks.  Patient was seen in ED on 8/13 and had negative left lower extremity venous Doppler for DVT. Pt was started on Keflex , doxycycline  and Levaquin  for cellulitis.  Patient states that he has been taking these antibiotics, but no improvement.  He states that his swelling and redness has been worsening.  His left lower leg pain is mild to moderate, constant, aching, nonradiating, aggravated by walking.  No fever or chills.  Patient does not have chest pain, cough, SOB.  No nausea, vomiting, diarrhea or abdominal pain.  No symptoms of UTI.  Patient had a full course of dialysis today.  Left lower extremity venous Doppler on 8/13: 1. No DVT of the left lower extremity. 2. 3.4 x 0.7 x 0.9 cm fluid collection in the LEFT popliteal fossa consistent with Baker's cyst.     Data reviewed independently and ED Course: pt was found to have WBC 7.7, lactic acid of 1.9, potassium 3.8, bicarbonate 28, creatinine 3.38, BUN 23.  Temperature normal, blood pressure 165/73, heart rate 81, RR 19.  Patient is placed in MedSurg bed for observation.  Dr. Douglas of renal is consulted.   EKG: I have personally reviewed.  Sinus rhythm, QTc 460, nonspecific T wave change.   Review of Systems:   General: no fevers, chills, no body weight gain, fatigue HEENT: no blurry vision, hearing changes or sore throat Respiratory: no  dyspnea, coughing, wheezing CV: no chest pain, no palpitations GI: no nausea, vomiting, abdominal pain, diarrhea, constipation GU: no dysuria, burning on urination, increased urinary frequency, hematuria  Ext: has swelling, redness and pain in left lower leg Neuro: no unilateral weakness, numbness, or tingling, no vision change or hearing loss Skin: no rash, no skin tear. MSK: No muscle spasm, no deformity, no limitation of range of movement in spin Heme: No easy bruising.  Travel history: No recent long distant travel.   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   Chronic bilateral low back pain without sciatica 03/26/2021   S/p 4 lumbar spine surgeries   Chronic kidney disease (CKD), stage V (HCC)    Chronic obstructive pulmonary disease, unspecified (HCC) 07/11/2021   no inhalers   COVID-19 virus infection 08/31/2021   Cyst of kidney, acquired    Essential hypertension 03/26/2021   GERD (gastroesophageal reflux disease)    Hyperlipidemia    Hyperparathyroidism (HCC)    Hypertension    Mixed hyperlipidemia 03/26/2021   Pre-diabetes    Proteinuria    Thyroid  disease    Tobacco use disorder     Past Surgical History:  Procedure Laterality Date   A/V FISTULAGRAM N/A 06/14/2024   Procedure: A/V Fistulagram;  Surgeon: Marea Selinda RAMAN, MD;  Location: [facility];  Service: Cardiovascular;  Laterality: N/A;   AV FISTULA PLACEMENT Left 05/01/2023   Procedure: ARTERIOVENOUS (AV) FISTULA CREATION ( RADIOCEPHALIC);  Surgeon: Marea Selinda RAMAN, MD;  Location: [facility];  Service: Vascular;  Laterality: Left;   CYSTOSCOPY W/ RETROGRADES Bilateral 07/03/2021   Procedure: CYSTOSCOPY WITH RETROGRADE PYELOGRAM;  Surgeon: Penne Knee, MD;  Location: [facility];  Service: Urology;   Laterality: Bilateral;   CYSTOSCOPY WITH LITHOLAPAXY N/A 07/03/2021   Procedure: CYSTOSCOPY WITH LITHOLAPAXY;  Surgeon: Penne Knee, MD;  Location: [facility] OR[facility];  Service: Urology;  Laterality: N/A;   HOLEP-LASER ENUCLEATION OF THE PROSTATE WITH MORCELLATION N/A 07/03/2021   Procedure: HOLEP-LASER ENUCLEATION OF THE PROSTATE WITH MORCELLATION;  Surgeon: Penne Knee, MD;  Location: [facility];  Service: Urology;  Laterality: N/A;   SPINE SURGERY  2015   X 4 surgery - lumbar disc    Social History:  reports that he has been smoking cigarettes. He has a 54 pack-year smoking history. He has been exposed to tobacco smoke. He has never used smokeless tobacco. He reports that he does not currently use alcohol. He reports that he does not use drugs.  Family History:  Family History  Problem Relation Age of Onset   Hypertension Mother    Heart disease Mother    Heart disease Sister    Hypertension Sister    Prostate cancer Neg Hx    Bladder Cancer Neg Hx    Kidney cancer Neg Hx      Prior to Admission medications   Medication Sig Start Date End Date Taking? Authorizing Provider  acetaminophen  (TYLENOL ) 500 MG tablet Take 1,000 mg by mouth every 6 (six) hours as needed for moderate pain.    [provider]  albuterol  (VENTOLIN  HFA) 108 (90 Base) MCG/ACT inhaler Inhale 2 puffs into the lungs every 6 (six) hours as needed for wheezing or shortness of breath. 05/19/24   [name], MD  amLODipine  (NORVASC ) 2.5 MG tablet Take 2.5 mg by mouth at bedtime.    [provider]  carvedilol  (COREG ) 25 MG tablet TAKE 1 TABLET BY MOUTH TWICE DAILY WITH A MEAL 04/08/24   [name], MD  cephALEXin  (KEFLEX ) 500 MG capsule Take 1 capsule (500 mg total) by mouth 4 (four) times daily for 7 days. 06/24/24 07/01/24  Levander Slate, MD  Cholecalciferol  (D3-1000 PO) Take 2,000 Units by mouth at bedtime.    [provider]  clopidogrel  (PLAVIX ) 75 MG tablet Take 1 tablet (75 mg total)  by mouth daily. 04/02/24   Jens Durand, MD  doxycycline  (VIBRA -TABS) 100 MG tablet Take 1 tablet (100 mg total) by mouth 2 (two) times daily for 7 days. 06/24/24 07/01/24  Levander Slate, MD  ferrous sulfate  325 (65 FE) MG tablet Take 325 mg by mouth at bedtime.    [provider]  levofloxacin  (LEVAQUIN ) 250 MG tablet Take 2 tablet after dialysis on 8/14 the one tablet every 2 days until gone 06/23/24   [name], MD  levothyroxine  (SYNTHROID ) 112 MCG tablet TAKE 1 TABLET BY MOUTH BEFORE BREAKFAST 05/11/24   Berglund, Laura H, MD  lidocaine -prilocaine  (EMLA ) cream Apply 1 Application topically as directed. 07/22/23   [provider]  Multiple Vitamins-Minerals (MULTIVITAL-M PO) Take 1 tablet by mouth at bedtime.    [provider]  omeprazole  (PRILOSEC) 20 MG capsule Take 1 capsule by mouth once daily 03/31/24   Berglund, Laura H, MD  ondansetron  (ZOFRAN -ODT) 4 MG disintegrating tablet  Take 4 mg by mouth every 6 (six) hours as needed. 02/24/24   [provider]  rosuvastatin  (CRESTOR ) 20 MG tablet Take 1 tablet (20 mg total) by mouth at bedtime. 04/08/24   [name], MD    Physical Exam: Vitals:   06/29/24 1626 06/29/24 1627 06/29/24 1839  BP: (!) 153/62  (!) 165/73  Pulse: 81  71  Resp: 19  18  Temp: 98.1 F (36.7 C)    TempSrc: Oral    SpO2: 98%  99%  Weight:  94.3 kg   Height:  5' 11 (1.803 m)    General: Not in acute distress HEENT:       Eyes: PERRL, EOMI, no jaundice       ENT: No discharge from the ears and nose, no pharynx injection, no tonsillar enlargement.        Neck: No JVD, no bruit, no mass felt. Heme: No neck lymph node enlargement. Cardiac: S1/S2, RRR, No murmurs, No gallops or rubs. Respiratory: No rales, wheezing, rhonchi or rubs. GI: Soft, nondistended, nontender, no rebound pain, no organomegaly, BS present. GU: No hematuria Ext: Has swelling, erythema, tenderness, warmth over left lower leg    Musculoskeletal: No  joint deformities, No joint redness or warmth, no limitation of ROM in spin. Skin: No rashes.  Neuro: Alert, oriented X3, cranial nerves II-XII grossly intact, moves all extremities normally. Psych: Patient is not psychotic, no suicidal or hemocidal ideation.  Labs on Admission: I have personally reviewed following labs and imaging studies  CBC: Recent Labs  Lab 06/24/24 1049 06/29/24 1631  WBC 7.8 7.7  NEUTROABS 5.7 5.4  HGB 12.0* 11.5*  HCT 37.5* 34.7*  MCV 103.9* 101.5*  PLT 159 163   Basic Metabolic Panel: Recent Labs  Lab 06/24/24 1049 06/29/24 1631  NA 142 142  K 4.3 3.8  CL 103 104  CO2 33* 28  GLUCOSE 109* 150*  BUN 26* 23  CREATININE 3.86* 3.38*  CALCIUM  9.3 9.3   GFR: Estimated Creatinine Clearance: 23.2 mL/min (A) (by C-G formula based on SCr of 3.38 mg/dL (H)). Liver Function Tests: Recent Labs  Lab 06/24/24 1049 06/29/24 1631  AST 24 24  ALT 19 17  ALKPHOS 125 123  BILITOT 0.6 0.8  PROT 8.0 7.6  ALBUMIN 3.7 3.4*   No results for input(s): LIPASE, AMYLASE in the last 168 hours. No results for input(s): AMMONIA in the last 168 hours. Coagulation Profile: Recent Labs  Lab 06/24/24 1049  INR 1.0   Cardiac Enzymes: No results for input(s): CKTOTAL, CKMB, CKMBINDEX, TROPONINI in the last 168 hours. BNP (last 3 results) No results for input(s): PROBNP in the last 8760 hours. HbA1C: No results for input(s): HGBA1C in the last 72 hours. CBG: No results for input(s): GLUCAP in the last 168 hours. Lipid Profile: No results for input(s): CHOL, HDL, LDLCALC, TRIG, CHOLHDL, LDLDIRECT in the last 72 hours. Thyroid  Function Tests: No results for input(s): TSH, T4TOTAL, FREET4, T3FREE, THYROIDAB in the last 72 hours. Anemia Panel: No results for input(s): VITAMINB12, FOLATE, FERRITIN, TIBC, IRON, RETICCTPCT in the last 72 hours. Urine analysis:    Component Value Date/Time   COLORURINE YELLOW  06/29/2021 1614   APPEARANCEUR Cloudy (A) 07/10/2021 1345   LABSPEC 1.020 06/29/2021 1614   PHURINE 5.5 06/29/2021 1614   GLUCOSEU Negative 07/10/2021 1345   HGBUR TRACE (A) 06/29/2021 1614   BILIRUBINUR Negative 07/10/2021 1345   KETONESUR NEGATIVE 06/29/2021 1614   PROTEINUR 3+ (A) 07/10/2021 1345  PROTEINUR 100 (A) 06/29/2021 1614   UROBILINOGEN 0.2 03/26/2021 1433   NITRITE Negative 07/10/2021 1345   NITRITE NEGATIVE 06/29/2021 1614   LEUKOCYTESUR 1+ (A) 07/10/2021 1345   LEUKOCYTESUR NEGATIVE 06/29/2021 1614   Sepsis Labs: @LABRCNTIP (procalcitonin:4,lacticidven:4) ) Recent Results (from the past 240 hours)  Culture, blood (Routine x 2)     Status: None   Collection Time: 06/24/24 10:49 AM   Specimen: BLOOD RIGHT ARM  Result Value Ref Range Status   Specimen Description BLOOD RIGHT ARM  Final   Special Requests   Final    BOTTLES DRAWN AEROBIC AND ANAEROBIC Blood Culture adequate volume   Culture   Final    NO GROWTH 5 DAYS Performed at [facility], [address] [place], [place] [address]    Report Status 06/29/2024 FINAL  Final     Radiological Exams on Admission:   Assessment/Plan Principal Problem:   Cellulitis of left lower extremity Active Problems:   ESRD on dialysis [facility])   Essential hypertension   Acquired hypothyroidism   Chronic obstructive pulmonary disease, unspecified (HCC)   History of stroke   Anemia in chronic kidney disease   HLD (hyperlipidemia)   Tobacco use disorder   Overweight (BMI 25.0-29.9)   Assessment and Plan:  Cellulitis of left lower extremity: Patient failed outpatient 3 antibiotic treatment (Keflex , doxycycline  and Levaquin ).  No fever or leukocytosis, clinically not septic.  Patient had left lower extremity venous Doppler 8/13 which was negative for DVT, but showed Bakers cyst.  - will place in  med-surg bed for obs - Empiric antimicrobial treatment with vancomycin  and Rocephin  - PRN Zofran  for nausea, and  Tylenol ,and Percocet for pain - Blood cultures x 2  - ESR and CRP  ESRD on dialysis (TTS): -consulted Dr. Douglas of renal for dialysis  Essential hypertension -IV hydralazine  as needed - Amlodipine  and Coreg   Acquired hypothyroidism -Synthroid   Chronic obstructive pulmonary disease, unspecified (HCC): Stable -Bronchodilators as needed Mucinex   History of stroke -Crestor  -Patient is not taking Plavix  currently  Anemia in chronic kidney disease: Hemoglobin stable 11.5 -Continue iron supplement  HLD (hyperlipidemia) -Crestor   Tobacco use disorder -Nicotine  patch  Overweight (BMI 25.0-29.9): Body weight 94.3 kg, BMI 29.01 -Encourage losing weight - Exercise and healthy diet       DVT ppx: SQ Heparin     Code Status: Full code     Family Communication:     not done, no family member is at bed side.         Disposition Plan:  Anticipate discharge back to previous environment  Consults called:  Dr. Douglas of renal is consulted.  Admission status and Level of care: Med-Surg:    for obs       Dispo: The patient is from: Home              Anticipated d/c is to: Home              Anticipated d/c date is: 1 day              Patient currently is not medically stable to d/c.    Severity of Illness:  The appropriate patient status for this patient is OBSERVATION. Observation status is judged to be reasonable and necessary in order to provide the required intensity of service to ensure the patient's safety. The patient's presenting symptoms, physical exam findings, and initial radiographic and laboratory data in the context of their medical condition is felt to place them at decreased  risk for further clinical deterioration. Furthermore, it is anticipated that the patient will be medically stable for discharge from the hospital within 2 midnights of admission.        Date of Service 06/29/2024    Caleb Exon [facility]   If 7PM-7AM, please contact  night-coverage [email or web address] 06/29/2024, 8:03 PM

## 2024-06-29 NOTE — ED Triage Notes (Signed)
 Pt sts that he has been having swelling of the left leg for the last three weeks. Pt sts that he was given oral abx to take and he has done so but was told to come back to the ED by his PCP.

## 2024-06-30 DIAGNOSIS — K219 Gastro-esophageal reflux disease without esophagitis: Secondary | ICD-10-CM | POA: Diagnosis present

## 2024-06-30 DIAGNOSIS — J449 Chronic obstructive pulmonary disease, unspecified: Secondary | ICD-10-CM | POA: Diagnosis present

## 2024-06-30 DIAGNOSIS — E663 Overweight: Secondary | ICD-10-CM | POA: Diagnosis present

## 2024-06-30 DIAGNOSIS — E039 Hypothyroidism, unspecified: Secondary | ICD-10-CM | POA: Diagnosis present

## 2024-06-30 DIAGNOSIS — N138 Other obstructive and reflux uropathy: Secondary | ICD-10-CM | POA: Diagnosis present

## 2024-06-30 DIAGNOSIS — M7122 Synovial cyst of popliteal space [Baker], left knee: Secondary | ICD-10-CM | POA: Diagnosis present

## 2024-06-30 DIAGNOSIS — Z992 Dependence on renal dialysis: Secondary | ICD-10-CM | POA: Diagnosis not present

## 2024-06-30 DIAGNOSIS — L03116 Cellulitis of left lower limb: Secondary | ICD-10-CM | POA: Diagnosis present

## 2024-06-30 DIAGNOSIS — Z6829 Body mass index (BMI) 29.0-29.9, adult: Secondary | ICD-10-CM | POA: Diagnosis not present

## 2024-06-30 DIAGNOSIS — Z8673 Personal history of transient ischemic attack (TIA), and cerebral infarction without residual deficits: Secondary | ICD-10-CM | POA: Diagnosis not present

## 2024-06-30 DIAGNOSIS — N401 Enlarged prostate with lower urinary tract symptoms: Secondary | ICD-10-CM | POA: Diagnosis present

## 2024-06-30 DIAGNOSIS — F17210 Nicotine dependence, cigarettes, uncomplicated: Secondary | ICD-10-CM | POA: Diagnosis present

## 2024-06-30 DIAGNOSIS — D696 Thrombocytopenia, unspecified: Secondary | ICD-10-CM | POA: Diagnosis present

## 2024-06-30 DIAGNOSIS — R7303 Prediabetes: Secondary | ICD-10-CM | POA: Diagnosis present

## 2024-06-30 DIAGNOSIS — I10 Essential (primary) hypertension: Secondary | ICD-10-CM | POA: Diagnosis not present

## 2024-06-30 DIAGNOSIS — Z79899 Other long term (current) drug therapy: Secondary | ICD-10-CM | POA: Diagnosis not present

## 2024-06-30 DIAGNOSIS — E782 Mixed hyperlipidemia: Secondary | ICD-10-CM | POA: Diagnosis present

## 2024-06-30 DIAGNOSIS — I120 Hypertensive chronic kidney disease with stage 5 chronic kidney disease or end stage renal disease: Secondary | ICD-10-CM | POA: Diagnosis present

## 2024-06-30 DIAGNOSIS — Z7902 Long term (current) use of antithrombotics/antiplatelets: Secondary | ICD-10-CM | POA: Diagnosis not present

## 2024-06-30 DIAGNOSIS — N186 End stage renal disease: Secondary | ICD-10-CM | POA: Diagnosis present

## 2024-06-30 DIAGNOSIS — Z8249 Family history of ischemic heart disease and other diseases of the circulatory system: Secondary | ICD-10-CM | POA: Diagnosis not present

## 2024-06-30 DIAGNOSIS — Z8616 Personal history of COVID-19: Secondary | ICD-10-CM | POA: Diagnosis not present

## 2024-06-30 DIAGNOSIS — D631 Anemia in chronic kidney disease: Secondary | ICD-10-CM | POA: Diagnosis present

## 2024-06-30 LAB — CBC
HCT: 33.5 % — ABNORMAL LOW (ref 39.0–52.0)
Hemoglobin: 10.9 g/dL — ABNORMAL LOW (ref 13.0–17.0)
MCH: 33.2 pg (ref 26.0–34.0)
MCHC: 32.5 g/dL (ref 30.0–36.0)
MCV: 102.1 fL — ABNORMAL HIGH (ref 80.0–100.0)
Platelets: 142 K/uL — ABNORMAL LOW (ref 150–400)
RBC: 3.28 MIL/uL — ABNORMAL LOW (ref 4.22–5.81)
RDW: 13 % (ref 11.5–15.5)
WBC: 6.9 K/uL (ref 4.0–10.5)
nRBC: 0 % (ref 0.0–0.2)

## 2024-06-30 LAB — BASIC METABOLIC PANEL WITH GFR
Anion gap: 9 (ref 5–15)
BUN: 28 mg/dL — ABNORMAL HIGH (ref 8–23)
CO2: 27 mmol/L (ref 22–32)
Calcium: 9.4 mg/dL (ref 8.9–10.3)
Chloride: 108 mmol/L (ref 98–111)
Creatinine, Ser: 3.76 mg/dL — ABNORMAL HIGH (ref 0.61–1.24)
GFR, Estimated: 16 mL/min — ABNORMAL LOW (ref >60)
Glucose, Bld: 87 mg/dL (ref 70–99)
Potassium: 4.4 mmol/L (ref 3.5–5.1)
Sodium: 144 mmol/L (ref 135–145)

## 2024-06-30 LAB — C-REACTIVE PROTEIN: CRP: 0.9 mg/dL (ref <1.0)

## 2024-06-30 MED ORDER — CHLORHEXIDINE GLUCONATE CLOTH 2 % EX PADS: 6.0000 | MEDICATED_PAD | Freq: Every day | CUTANEOUS | Status: DC

## 2024-06-30 MED ORDER — VANCOMYCIN HCL 1000 MG IV SOLR: 1000.0000 mg | INTRAVENOUS | Status: DC | PRN

## 2024-06-30 MED ORDER — FLUTICASONE FUROATE-VILANTEROL 200-25 MCG/ACT IN AEPB
1.0000 | INHALATION_SPRAY | Freq: Every day | RESPIRATORY_TRACT | Status: DC
  Administered 2024-06-30 – 2024-07-01 (×2): 1 via RESPIRATORY_TRACT
  Filled 2024-06-30: qty 28

## 2024-06-30 MED ORDER — PNEUMOCOCCAL 20-VAL CONJ VACC 0.5 ML IM SUSY: 0.5000 mL | PREFILLED_SYRINGE | INTRAMUSCULAR | Status: DC

## 2024-06-30 NOTE — Plan of Care (Signed)

## 2024-06-30 NOTE — Hospital Course (Signed)
 Tyrone Moore. is a 73 y.o. male with medical history significant of ESRD-HD (TTS), HTN, HLD, stroke, COPD, GERD, hypothyroidism, BPH, chronic back pain, tobacco abuse, anemia, who presents with left lower leg swelling, redness and pain.  Patient placed on vancomycin  and Rocephin  for cellulitis.

## 2024-06-30 NOTE — Progress Notes (Signed)
 Pt receives outpt HD at  [facility] on  TTS . Navigator following to assist with any HD needs.  Suzen Satchel Dialysis Navigator [phone]

## 2024-06-30 NOTE — Consult Note (Signed)
 Pharmacy Antibiotic Note  Tyrone Moore. is a 73 y.o. male admitted on 06/29/2024 with LLE cellulitis. PMH significant for ESRD on HD (TTS), HTN, Hypothyroidism, COPD, stroke, Anemia, HLD, tobacco use disorder . Pharmacy has been consulted for vancomycin  dosing.  8/19: Received vancomycin  loading dose of 2 g. Also receiving ceftriaxone  2 g q24h per provider.   Plan: Will start maintenance dose of vancomycin  1000 mg given after every dialysis session Will follow up on nephrology consult regarding dialysis schedule while inpatient Ceftriaxone  2 g every 24 hours per provider  Will monitor renal function and signs of clinical improvement.   Height: 5' 11 (180.3 cm) Weight: 95.1 kg (209 lb 10.5 oz) IBW/kg (Calculated) : 75.3  Temp (24hrs), Avg:98.1 F (36.7 C), Min:97.7 F (36.5 C), Max:98.7 F (37.1 C)  Recent Labs  Lab 06/24/24 1049 06/24/24 1050 06/29/24 1631 06/30/24 0550  WBC 7.8  --  7.7 6.9  CREATININE 3.86*  --  3.38* 3.76*  LATICACIDVEN  --  1.2 1.9  --     Estimated Creatinine Clearance: 20.9 mL/min (A) (by C-G formula based on SCr of 3.76 mg/dL (H)).    No Known Allergies  Antimicrobials this admission: Vancomycin  8/19 >>  Ceftriaxone  8/19 >>   Dose adjustments this admission: N/A  Microbiology results: 8/19 BCx: NGTD    Thank you for allowing pharmacy to be a part of this patient's care.  Ransom Blanch PGY-1 Pharmacy Resident  [place] [facility]  06/30/2024 12:04 PM

## 2024-06-30 NOTE — Progress Notes (Signed)
  Progress Note   Patient: Tyrone Moore. FMW:[id number] DOB: August 12, 1951 DOA: 06/29/2024     0 DOS: the patient was seen and examined on 06/30/2024   Brief hospital course: Hoyt Leanos. is a 73 y.o. male with medical history significant of ESRD-HD (TTS), HTN, HLD, stroke, COPD, GERD, hypothyroidism, BPH, chronic back pain, tobacco abuse, anemia, who presents with left lower leg swelling, redness and pain.  Patient placed on vancomycin  and Rocephin  for cellulitis.   Principal Problem:   Cellulitis of left lower extremity Active Problems:   ESRD on dialysis [facility])   Essential hypertension   Acquired hypothyroidism   Chronic obstructive pulmonary disease, unspecified (HCC)   History of stroke   Anemia in chronic kidney disease   HLD (hyperlipidemia)   Tobacco use disorder   Overweight (BMI 25.0-29.9)   Left leg cellulitis   Assessment and Plan:  Cellulitis of left lower extremity: Failed outpatient antibiotic treatment.  Placed on vancomycin  and Rocephin , per patient, condition has not been improving since admission.  Not ready for discharge.  Will continue current treatment.  COPD. Tobacco abuse. Patient has some wheezes, he states that this is a chronic.  He has been smoking all his life, he is adamant that he will not quit smoking. At this point, I will continue bronchodilator as needed, also added Breo.  ESRD on dialysis (TTS): Anemia of end-stage renal disease. Followed by nephrology for continued dialysis. Hemoglobin stable.   Essential hypertension Home medicines continued.  Acquired hypothyroidism -Synthroid     History of stroke -Crestor  -Patient is not taking Plavix  currently    HLD (hyperlipidemia) -Crestor    Tobacco use disorder -Nicotine  patch   Overweight (BMI 25.0-29.9): Body weight 94.3 kg, BMI 29.01 -Encourage losing weight - Exercise and healthy diet   Mild thrombocytopenia.  She will continue to follow      Subjective:  Still  complaining of left leg pain, wheezing.  Does not feel short of breath  Physical Exam: Vitals:   06/29/24 2202 06/30/24 0344 06/30/24 0500 06/30/24 0916  BP: (!) 148/67 136/81  (!) 171/71  Pulse: 74 76  72  Resp: 18 19  18   Temp: 97.7 F (36.5 C) 98 F (36.7 C)  98.7 F (37.1 C)  TempSrc:      SpO2: 96% 96%  95%  Weight:   95.1 kg   Height:       General exam: Appears calm and comfortable  Respiratory system: Breathing sounds with wheezes. Respiratory effort normal. Cardiovascular system: S1 & S2 heard, RRR. No JVD, murmurs, rubs, gallops or clicks.  Gastrointestinal system: Abdomen is nondistended, soft and nontender. No organomegaly or masses felt. Normal bowel sounds heard. Central nervous system: Alert and oriented. No focal neurological deficits. Extremities: Leg appears to be red, some tenderness.  Mild swelling. Skin: No rashes, lesions or ulcers Psychiatry: Judgement and insight appear normal. Mood & affect appropriate.    Data Reviewed:  Lab results reviewed.  Family Communication: None  Disposition: Status is: Inpatient Remains inpatient appropriate because: Severity of disease, IV treatment     Time spent: 35 minutes  Author: Murvin Mana, MD 06/30/2024 12:55 PM  For on call review [email or web address].

## 2024-06-30 NOTE — Progress Notes (Signed)
 [facility]  ROUNDING NOTE   Subjective:   Tyrone Yo. is a 73 year old male with past medical conditions including tobacco abuse, hypothyroidism, hyperlipidemia, hypertension, and end-stage renal disease on hemodialysis. Patient reports to the emergency department for evaluation of left leg swelling. He has been admitted for Cellulitis of left lower extremity [L03.116] Left leg cellulitis [L03.116]  Patient is known to our practice and received outpatient dialysis treatments at [facility] on a TTS schedule, supervised by Dr Marcelino. Last treatment was received on Tuesday. Patient states his leg began to swell about 3 weeks ago. States it's painful to walk. Does not endorse any recent injury. Recent diagnosis of posterior left knee cyst.   Labs on ED arrival unremarkable for renal patient.   We have been consulted to manage dialysis needs during this admission.    Objective:  Vital signs in last 24 hours:  Temp:  [97.7 F (36.5 C)-98.7 F (37.1 C)] 98.7 F (37.1 C) (08/20 0916) Pulse Rate:  [69-81] 72 (08/20 0916) Resp:  [17-19] 18 (08/20 0916) BP: (136-194)/(62-107) 171/71 (08/20 0916) SpO2:  [95 %-99 %] 95 % (08/20 0916) Weight:  [94.3 kg-95.1 kg] 95.1 kg (08/20 0500)  Weight change:  Filed Weights   06/29/24 1627 06/30/24 0500  Weight: 94.3 kg 95.1 kg    Intake/Output: I/O last 3 completed shifts: In: 206 [P.O.:60; IV Piggyback:146] Out: 200 [Urine:200]   Intake/Output this shift:  Total I/O In: 240 [P.O.:240] Out: -   Physical Exam: General: NAD  Head: Normocephalic, atraumatic. Moist oral mucosal membranes  Eyes: Anicteric  Lungs:  Clear to auscultation, normla effort  Heart: Regular rate and rhythm  Abdomen:  Soft, nontender  Extremities:  1+ peripheral edema, Left greater than right  Neurologic: Awake, alert, conversant  Skin: LLE, warm, erythema   Access: Lt AVF    Basic Metabolic Panel: Recent Labs  Lab 06/24/24 1049  06/29/24 1631 06/30/24 0550  NA 142 142 144  K 4.3 3.8 4.4  CL 103 104 108  CO2 33* 28 27  GLUCOSE 109* 150* 87  BUN 26* 23 28*  CREATININE 3.86* 3.38* 3.76*  CALCIUM  9.3 9.3 9.4    Liver Function Tests: Recent Labs  Lab 06/24/24 1049 06/29/24 1631  AST 24 24  ALT 19 17  ALKPHOS 125 123  BILITOT 0.6 0.8  PROT 8.0 7.6  ALBUMIN 3.7 3.4*   No results for input(s): LIPASE, AMYLASE in the last 168 hours. No results for input(s): AMMONIA in the last 168 hours.  CBC: Recent Labs  Lab 06/24/24 1049 06/29/24 1631 06/30/24 0550  WBC 7.8 7.7 6.9  NEUTROABS 5.7 5.4  --   HGB 12.0* 11.5* 10.9*  HCT 37.5* 34.7* 33.5*  MCV 103.9* 101.5* 102.1*  PLT 159 163 142*    Cardiac Enzymes: No results for input(s): CKTOTAL, CKMB, CKMBINDEX, TROPONINI in the last 168 hours.  BNP: Invalid input(s): POCBNP  CBG: No results for input(s): GLUCAP in the last 168 hours.  Microbiology: Results for orders placed or performed during the hospital encounter of 06/29/24  Blood culture (single)     Status: None (Preliminary result)   Collection Time: 06/29/24  4:31 PM   Specimen: BLOOD  Result Value Ref Range Status   Specimen Description BLOOD RIGHT ANTECUBITAL  Final   Special Requests   Final    BOTTLES DRAWN AEROBIC AND ANAEROBIC Blood Culture adequate volume   Culture   Final    NO GROWTH < 24 HOURS Performed at  [facility], [address] [place], [place] [address]    Report Status PENDING  Incomplete    Coagulation Studies: No results for input(s): LABPROT, INR in the last 72 hours.  Urinalysis: No results for input(s): COLORURINE, LABSPEC, PHURINE, GLUCOSEU, HGBUR, BILIRUBINUR, KETONESUR, PROTEINUR, UROBILINOGEN, NITRITE, LEUKOCYTESUR in the last 72 hours.  Invalid input(s): APPERANCEUR    Imaging: No results found.   Medications:    cefTRIAXone  (ROCEPHIN )  IV Stopped (06/29/24 2215)   vancomycin        amLODipine   2.5 mg Oral QHS   carvedilol   25 mg Oral BID WC   cholecalciferol   2,000 Units Oral Q1400   ferrous sulfate   325 mg Oral QHS   fluticasone  furoate-vilanterol  1 puff Inhalation Daily   heparin   5,000 Units Subcutaneous Q8H   levothyroxine   112 mcg Oral Q0600   nicotine   21 mg Transdermal Daily   pantoprazole   40 mg Oral Daily   [START ON 07/01/2024] pneumococcal 20-valent conjugate vaccine  0.5 mL Intramuscular Tomorrow-1000   rosuvastatin   20 mg Oral QHS   acetaminophen , albuterol , dextromethorphan-guaiFENesin , hydrALAZINE , ondansetron  (ZOFRAN ) IV, oxyCODONE -acetaminophen , vancomycin   Assessment/ Plan:  [name] is a 73 y.o.  male with past medical conditions including tobacco abuse, hypothyroidism, hyperlipidemia, hypertension, and end-stage renal disease on hemodialysis. Patient reports to the emergency department for evaluation of left leg swelling. He has been admitted for Cellulitis of left lower extremity [L03.116] Left leg cellulitis [L03.116]  [facility] TTS Lt AVF  End stage renal disease on hemodialysis. Last treatment on Tuesday. Will plan for dialysis in am.   2. Cellulitis, left lower extremity, Awaiting blood cultures. Primary team has ordered Vancomycin  and rocephin .   3. Anemia of chronic kidney disease Lab Results  Component Value Date   HGB 10.9 (L) 06/30/2024    Hgb stable. No need for ESA at this time.   4. Hypertension with chronic kidney disease. Receiving amlodipine  and carvedilol .   5. Secondary Hyperparathyroidism: with outpatient labs:Unavailable Lab Results  Component Value Date   CALCIUM  9.4 06/30/2024    Will continue to monitor bone minerals.    LOS: 0 Tyrone Moore 8/20/20253:42 PM

## 2024-07-01 ENCOUNTER — Other Ambulatory Visit: Payer: Self-pay

## 2024-07-01 DIAGNOSIS — N186 End stage renal disease: Secondary | ICD-10-CM | POA: Diagnosis not present

## 2024-07-01 DIAGNOSIS — L03116 Cellulitis of left lower limb: Secondary | ICD-10-CM | POA: Diagnosis not present

## 2024-07-01 DIAGNOSIS — Z992 Dependence on renal dialysis: Secondary | ICD-10-CM | POA: Diagnosis not present

## 2024-07-01 DIAGNOSIS — I10 Essential (primary) hypertension: Secondary | ICD-10-CM | POA: Diagnosis not present

## 2024-07-01 LAB — HEPATITIS B SURFACE ANTIGEN: Hepatitis B Surface Ag: NONREACTIVE

## 2024-07-01 LAB — RENAL FUNCTION PANEL
Albumin: 3.1 g/dL — ABNORMAL LOW (ref 3.5–5.0)
Anion gap: 13 (ref 5–15)
BUN: 39 mg/dL — ABNORMAL HIGH (ref 8–23)
CO2: 23 mmol/L (ref 22–32)
Calcium: 9.4 mg/dL (ref 8.9–10.3)
Chloride: 107 mmol/L (ref 98–111)
Creatinine, Ser: 5.05 mg/dL — ABNORMAL HIGH (ref 0.61–1.24)
GFR, Estimated: 11 mL/min — ABNORMAL LOW (ref >60)
Glucose, Bld: 115 mg/dL — ABNORMAL HIGH (ref 70–99)
Phosphorus: 4.5 mg/dL (ref 2.5–4.6)
Potassium: 4.8 mmol/L (ref 3.5–5.1)
Sodium: 143 mmol/L (ref 135–145)

## 2024-07-01 LAB — CBC
HCT: 31.5 % — ABNORMAL LOW (ref 39.0–52.0)
Hemoglobin: 10.3 g/dL — ABNORMAL LOW (ref 13.0–17.0)
MCH: 33.3 pg (ref 26.0–34.0)
MCHC: 32.7 g/dL (ref 30.0–36.0)
MCV: 101.9 fL — ABNORMAL HIGH (ref 80.0–100.0)
Platelets: 134 K/uL — ABNORMAL LOW (ref 150–400)
RBC: 3.09 MIL/uL — ABNORMAL LOW (ref 4.22–5.81)
RDW: 13.1 % (ref 11.5–15.5)
WBC: 5.7 K/uL (ref 4.0–10.5)
nRBC: 0 % (ref 0.0–0.2)

## 2024-07-01 MED ORDER — DOXYCYCLINE HYCLATE 100 MG PO TABS
100.0000 mg | ORAL_TABLET | Freq: Two times a day (BID) | ORAL | 0 refills | Status: AC
  Filled 2024-07-01: qty 12, 6d supply, fill #0

## 2024-07-01 MED ORDER — ONDANSETRON HCL 4 MG/2ML IJ SOLN
INTRAMUSCULAR | Status: AC
Start: 2024-07-01 — End: 2024-07-01
  Filled 2024-07-01: qty 2

## 2024-07-01 MED ORDER — VANCOMYCIN HCL 1000 MG IV SOLR
1000.0000 mg | INTRAVENOUS | Status: DC
  Administered 2024-07-01: 1000 mg via INTRAVENOUS
  Filled 2024-07-01 (×2): qty 20

## 2024-07-01 MED ORDER — FLUTICASONE-SALMETEROL 250-50 MCG/ACT IN AEPB
1.0000 | INHALATION_SPRAY | Freq: Two times a day (BID) | RESPIRATORY_TRACT | 0 refills | Status: DC
  Filled 2024-07-01: qty 60, 30d supply, fill #0

## 2024-07-01 MED ORDER — AMOXICILLIN-POT CLAVULANATE 500-125 MG PO TABS
1.0000 | ORAL_TABLET | Freq: Two times a day (BID) | ORAL | 0 refills | Status: AC
  Filled 2024-07-01: qty 12, 6d supply, fill #0

## 2024-07-01 NOTE — Progress Notes (Signed)
 D/C order noted. Contacted [facility] TTS to be advised of pt d/c today. The pt should resume care on 07/03/2024. Faxed d/c to clinic for continuation of care.  Suzen Satchel Dialysis Navigator [phone]

## 2024-07-01 NOTE — Progress Notes (Signed)
 Patient refuses bed alarm.

## 2024-07-01 NOTE — Plan of Care (Signed)

## 2024-07-01 NOTE — Progress Notes (Signed)
  Received patient in bed to unit.   Informed consent signed and in chart.    TX duration: 3.5hrs     Transported back to floor  Hand-off given to patient's nurse. No c/o and no acute distress noted    Access used: LAVF Access issues: none   Total UF removed: 2.0 Medication(s) given: vancomycin , zofran  Post HD VS: wnl Post HD weight: 89.3kg     Olivia Hurst LPN [facility]

## 2024-07-01 NOTE — Discharge Summary (Signed)
 Physician Discharge Summary   Patient: Tyrone Moore. MRN: [id number] DOB: 28-Feb-1951  Admit date:     06/29/2024  Discharge date: 07/01/24  Discharge Physician: Murvin Mana   PCP: [name], MD   Recommendations at discharge:    Follow up with PCP in 1 week  Discharge Diagnoses: Principal Problem:   Cellulitis of left lower extremity Active Problems:   ESRD on dialysis [facility])   Essential hypertension   Acquired hypothyroidism   Chronic obstructive pulmonary disease, unspecified (HCC)   History of stroke   Anemia in chronic kidney disease   HLD (hyperlipidemia)   Tobacco use disorder   Overweight (BMI 25.0-29.9)   Left leg cellulitis  Resolved Problems:   * No resolved hospital problems. *  Hospital Course: Tyrone Ribaudo. is a 73 y.o. male with medical history significant of ESRD-HD (TTS), HTN, HLD, stroke, COPD, GERD, hypothyroidism, BPH, chronic back pain, tobacco abuse, anemia, who presents with left lower leg swelling, redness and pain.  Patient placed on vancomycin  and Rocephin  for cellulitis. Condition improving, patient will receive the final dose of vancomycin  after discharge. He will be treated additionally with doxy and augmentin .  Assessment and Plan:  Cellulitis of left lower extremity: Failed outpatient antibiotic treatment.  Placed on vancomycin  and Rocephin . Seems improving. Patient will receive 1 more dose of vancomycin  after HD, its effect will last until Saturday when he is dialyzed again. Also added doxycycline  and augmentin  for 7 days.    COPD. Tobacco abuse. Patient has some wheezes, he states that this is a chronic.  He has been smoking all his life, he is adamant that he will not quit smoking. At this point, I will continue bronchodilator as needed, also added Breo.   ESRD on dialysis (TTS): Anemia of end-stage renal disease. Followed by nephrology for continued dialysis. Hemoglobin stable.   Essential hypertension Home medicines  continued.   Acquired hypothyroidism -Synthroid      History of stroke -Crestor  -Patient is not taking Plavix  currently     HLD (hyperlipidemia) -Crestor    Tobacco use disorder -Nicotine  patch   Overweight (BMI 25.0-29.9): Body weight 94.3 kg, BMI 29.01 -Encourage losing weight - Exercise and healthy diet   Mild thrombocytopenia.         Consultants: Nephrology Procedures performed: HD  Disposition: Home Diet recommendation:  Discharge Diet Orders (From admission, onward)     Start     Ordered   07/01/24 0000  Diet general       Comments: Renal diet   07/01/24 1424           Renal diet DISCHARGE MEDICATION: Allergies as of 07/01/2024   No Known Allergies      Medication List     STOP taking these medications    cephALEXin  500 MG capsule Commonly known as: KEFLEX    clopidogrel  75 MG tablet Commonly known as: PLAVIX    levofloxacin  250 MG tablet Commonly known as: Levaquin        TAKE these medications    acetaminophen  500 MG tablet Commonly known as: TYLENOL  Take 1,000 mg by mouth every 6 (six) hours as needed for moderate pain.   albuterol  108 (90 Base) MCG/ACT inhaler Commonly known as: VENTOLIN  HFA Inhale 2 puffs into the lungs every 6 (six) hours as needed for wheezing or shortness of breath.   amLODipine  2.5 MG tablet Commonly known as: NORVASC  Take 2.5 mg by mouth at bedtime.   amoxicillin -clavulanate 500-125 MG tablet Commonly known as: Augmentin  Take  1 tablet by mouth every 12 (twelve) hours for 6 days.   carvedilol  25 MG tablet Commonly known as: COREG  TAKE 1 TABLET BY MOUTH TWICE DAILY WITH A MEAL   D3-1000 PO Take 2,000 Units by mouth at bedtime.   doxycycline  100 MG tablet Commonly known as: VIBRA -TABS Take 1 tablet (100 mg total) by mouth 2 (two) times daily for 6 days.   ferrous sulfate  325 (65 FE) MG tablet Take 325 mg by mouth at bedtime.   fluticasone  furoate-vilanterol 200-25 MCG/ACT Aepb Commonly known  as: BREO ELLIPTA  Inhale 1 puff into the lungs daily. Start taking on: July 02, 2024   levothyroxine  112 MCG tablet Commonly known as: SYNTHROID  TAKE 1 TABLET BY MOUTH BEFORE BREAKFAST   lidocaine -prilocaine  cream Commonly known as: EMLA  Apply 1 Application topically as directed.   MULTIVITAL-M PO Take 1 tablet by mouth at bedtime.   omeprazole  20 MG capsule Commonly known as: PRILOSEC Take 1 capsule by mouth once daily   ondansetron  4 MG disintegrating tablet Commonly known as: ZOFRAN -ODT Take 4 mg by mouth every 6 (six) hours as needed.   rosuvastatin  20 MG tablet Commonly known as: CRESTOR  Take 1 tablet (20 mg total) by mouth at bedtime.        Follow-up Information     [name], MD Follow up in 1 week(s).   Specialty: Internal Medicine Contact information: [address] [place] [address] [phone]                Discharge Exam: Tyrone Moore   06/30/24 0500 07/01/24 0500 07/01/24 1407  Weight: 95.1 kg 94.9 kg 91.2 kg   General exam: Appears calm and comfortable  Respiratory system: Clear to auscultation. Respiratory effort normal. Cardiovascular system: S1 & S2 heard, RRR. No JVD, murmurs, rubs, gallops or clicks.  Gastrointestinal system: Abdomen is nondistended, soft and nontender. No organomegaly or masses felt. Normal bowel sounds heard. Central nervous system: Alert and oriented. No focal neurological deficits. Extremities: Left leg swelling and red and tender, but improving Skin: No rashes, lesions or ulcers Psychiatry: Judgement and insight appear normal. Mood & affect appropriate.    Condition at discharge: fair  The results of significant diagnostics from this hospitalization (including imaging, microbiology, ancillary and laboratory) are listed below for reference.   Imaging Studies: DG Chest 2 View if patient is not in a treatment room. Result Date: 06/24/2024 CLINICAL DATA:  Hypotension, short of breath,  suspected sepsis EXAM: CHEST - 2 VIEW COMPARISON:  07/10/2023 FINDINGS: Frontal and lateral views of the chest are obtained on 3 images. The cardiac silhouette is unremarkable. Streaky opacities are seen within the left lower lobe, favor bronchopneumonia given clinical presentation. No effusion or pneumothorax. No acute bony abnormalities. IMPRESSION: 1. Streaky left lower lobe consolidation, compatible with bronchopneumonia. Electronically Signed   By: Ozell Daring M.D.   On: 06/24/2024 11:06   US  Venous Img Lower Unilateral Left (DVT) Result Date: 06/23/2024 CLINICAL DATA:  LEFT lower extremity swelling and pain. Discoloration of LEFT lateral thigh. EXAM: LEFT LOWER EXTREMITY VENOUS DOPPLER ULTRASOUND TECHNIQUE: Gray-scale sonography with compression, as well as color and duplex ultrasound, were performed to evaluate the deep venous system(s) from the level of the common femoral vein through the popliteal and proximal calf veins. COMPARISON:  None available FINDINGS: VENOUS Normal compressibility of the common femoral, superficial femoral, and popliteal veins, as well as the visualized calf veins. Visualized portions of profunda femoral vein and great saphenous vein unremarkable. No  filling defects to suggest DVT on grayscale or color Doppler imaging. Doppler waveforms show normal direction of venous flow, normal respiratory plasticity and response to augmentation. Limited views of the contralateral common femoral vein are unremarkable. OTHER 3.4 x 0.7 x 0.9 cm complex fluid collection in the LEFT popliteal fossa consistent with Baker's cyst. Limitations: none IMPRESSION: 1. No DVT of the left lower extremity. 2. 3.4 x 0.7 x 0.9 cm fluid collection in the LEFT popliteal fossa consistent with Baker's cyst. Electronically Signed   By: Aliene  Mir M.D.   On: 06/23/2024 16:09   PERIPHERAL VASCULAR CATHETERIZATION Result Date: 06/14/2024 See surgical note for result.  VAS US  DUPLEX DIALYSIS ACCESS (AVF,  AVG) Result Date: 06/07/2024 DIALYSIS ACCESS Patient Name:  Tyrone Moore  Date of Exam:   06/02/2024 Medical Rec #: [id number]      Accession #:    [id number] Date of Birth: 06-02-1951      Patient Gender: M Patient Age:   2 years Exam Location:  [facility] Procedure:      VAS US  DUPLEX DIALYSIS ACCESS (AVF, AVG) Referring Phys: SELINDA GU --------------------------------------------------------------------------------  Access Site: Left Upper Extremity. Access Type: Radial-cephalic AVF. History: Patient complains of right arm swelling and bruising following          dialysis. Comparison Study: 08/14/2023 Performing Technologist: Leafy Gibes RVS  Examination Guidelines: A complete evaluation includes B-mode imaging, spectral Doppler, color Doppler, and power Doppler as needed of all accessible portions of each vessel. Unilateral testing is considered an integral part of a complete examination. Limited examinations for reoccurring indications may be performed as noted.  Findings: +--------------------+----------+-----------------+--------+ AVF                 PSV (cm/s)Flow Vol (mL/min)Comments +--------------------+----------+-----------------+--------+ Native artery inflow   555           900                +--------------------+----------+-----------------+--------+ AVF Anastomosis        407                              +--------------------+----------+-----------------+--------+  +------------+----------+-------------+----------+--------+ OUTFLOW VEINPSV (cm/s)Diameter (cm)Depth (cm)Describe +------------+----------+-------------+----------+--------+ AC Fossa        50                                    +------------+----------+-------------+----------+--------+ Prox Forearm    96                                    +------------+----------+-------------+----------+--------+ Mid Forearm    102                                     +------------+----------+-------------+----------+--------+ Dist Forearm   461                                    +------------+----------+-------------+----------+--------+  +---------------+-------------+---------+---------+----------+-----------------+                Diameter (cm)  Depth  BranchingPSV (cm/s)   Flow Volume                                  (  cm)                          (ml/min)      +---------------+-------------+---------+---------+----------+-----------------+ Lt Rad Art Dist                                   52                      +---------------+-------------+---------+---------+----------+-----------------+ Lt Ceph U/Arm                                     44                      Dist                                                                      +---------------+-------------+---------+---------+----------+-----------------+ Lt Ceph U/Arm                                     50                      Mid                                                                       +---------------+-------------+---------+---------+----------+-----------------+ Lt Bas U/Arm                                      67                      Dist                                                                      +---------------+-------------+---------+---------+----------+-----------------+ Lt Bas U/Arm                                      49                      Mid                                                                       +---------------+-------------+---------+---------+----------+-----------------+  Summary: The Left Radial Cephalic AVF appears to be patent throughout; Flow Volume appears to be Normal. Suggestive Steal at the level of the Risk but flow not compromised extending upward.  *See table(s) above for measurements and observations.  Diagnosing physician: Selinda Gu MD Electronically signed by  Selinda Gu MD on 06/07/2024 at 8:37:25 AM.   --------------------------------------------------------------------------------   Final     Microbiology: Results for orders placed or performed during the hospital encounter of 06/29/24  Blood culture (single)     Status: None (Preliminary result)   Collection Time: 06/29/24  4:31 PM   Specimen: BLOOD  Result Value Ref Range Status   Specimen Description BLOOD RIGHT ANTECUBITAL  Final   Special Requests   Final    BOTTLES DRAWN AEROBIC AND ANAEROBIC Blood Culture adequate volume   Culture   Final    NO GROWTH 2 DAYS Performed at [facility], [address] [place], [place] [address]    Report Status PENDING  Incomplete    Labs: CBC: Recent Labs  Lab 06/29/24 1631 06/30/24 0550  WBC 7.7 6.9  NEUTROABS 5.4  --   HGB 11.5* 10.9*  HCT 34.7* 33.5*  MCV 101.5* 102.1*  PLT 163 142*   Basic Metabolic Panel: Recent Labs  Lab 06/29/24 1631 06/30/24 0550  NA 142 144  K 3.8 4.4  CL 104 108  CO2 28 27  GLUCOSE 150* 87  BUN 23 28*  CREATININE 3.38* 3.76*  CALCIUM  9.3 9.4   Liver Function Tests: Recent Labs  Lab 06/29/24 1631  AST 24  ALT 17  ALKPHOS 123  BILITOT 0.8  PROT 7.6  ALBUMIN 3.4*   CBG: No results for input(s): GLUCAP in the last 168 hours.  Discharge time spent: .  Signed: Murvin Mana, MD [facility] 07/01/2024

## 2024-07-01 NOTE — Plan of Care (Signed)
   Problem: Education: Goal: Knowledge of General Education information will improve Description Including pain rating scale, medication(s)/side effects and non-pharmacologic comfort measures Outcome: Progressing

## 2024-07-01 NOTE — Progress Notes (Signed)
 [facility]  ROUNDING NOTE   Subjective:   Tyrone Moore. is a 73 year old male with past medical conditions including tobacco abuse, hypothyroidism, hyperlipidemia, hypertension, and end-stage renal disease on hemodialysis. Patient reports to the emergency department for evaluation of left leg swelling. He has been admitted for Cellulitis of left lower extremity [L03.116] Left leg cellulitis [L03.116]  Patient is known to our practice and received outpatient dialysis treatments at [facility] on a TTS schedule, supervised by Dr Marcelino.   Update: Patient sitting at side of bed Alert and oriented Continues to have discomfort in left leg   Objective:  Vital signs in last 24 hours:  Temp:  [97.6 F (36.4 C)-98.1 F (36.7 C)] 98.1 F (36.7 C) (08/21 1015) Pulse Rate:  [48-76] 74 (08/21 1015) Resp:  [16-20] 20 (08/21 1015) BP: (140-184)/(57-70) 140/63 (08/21 1015) SpO2:  [93 %-100 %] 93 % (08/21 1015) Weight:  [94.9 kg] 94.9 kg (08/21 0500)  Weight change: 0.552 kg Filed Weights   06/29/24 1627 06/30/24 0500 07/01/24 0500  Weight: 94.3 kg 95.1 kg 94.9 kg    Intake/Output: I/O last 3 completed shifts: In: 786 [P.O.:540; IV Piggyback:246] Out: 200 [Urine:200]   Intake/Output this shift:  Total I/O In: 240 [P.O.:240] Out: -   Physical Exam: General: NAD  Head: Normocephalic, atraumatic. Moist oral mucosal membranes  Eyes: Anicteric  Lungs:  Clear to auscultation, normla effort  Heart: Regular rate and rhythm  Abdomen:  Soft, nontender  Extremities:  1+ peripheral edema, Left greater than right  Neurologic: Awake, alert, conversant  Skin: LLE, warm, erythema   Access: Lt AVF    Basic Metabolic Panel: Recent Labs  Lab 06/29/24 1631 06/30/24 0550  NA 142 144  K 3.8 4.4  CL 104 108  CO2 28 27  GLUCOSE 150* 87  BUN 23 28*  CREATININE 3.38* 3.76*  CALCIUM  9.3 9.4    Liver Function Tests: Recent Labs  Lab 06/29/24 1631  AST 24  ALT 17   ALKPHOS 123  BILITOT 0.8  PROT 7.6  ALBUMIN 3.4*   No results for input(s): LIPASE, AMYLASE in the last 168 hours. No results for input(s): AMMONIA in the last 168 hours.  CBC: Recent Labs  Lab 06/29/24 1631 06/30/24 0550  WBC 7.7 6.9  NEUTROABS 5.4  --   HGB 11.5* 10.9*  HCT 34.7* 33.5*  MCV 101.5* 102.1*  PLT 163 142*    Cardiac Enzymes: No results for input(s): CKTOTAL, CKMB, CKMBINDEX, TROPONINI in the last 168 hours.  BNP: Invalid input(s): POCBNP  CBG: No results for input(s): GLUCAP in the last 168 hours.  Microbiology: Results for orders placed or performed during the hospital encounter of 06/29/24  Blood culture (single)     Status: None (Preliminary result)   Collection Time: 06/29/24  4:31 PM   Specimen: BLOOD  Result Value Ref Range Status   Specimen Description BLOOD RIGHT ANTECUBITAL  Final   Special Requests   Final    BOTTLES DRAWN AEROBIC AND ANAEROBIC Blood Culture adequate volume   Culture   Final    NO GROWTH 2 DAYS Performed at [facility], [address] [place], [place] [address]    Report Status PENDING  Incomplete    Coagulation Studies: No results for input(s): LABPROT, INR in the last 72 hours.  Urinalysis: No results for input(s): COLORURINE, LABSPEC, PHURINE, GLUCOSEU, HGBUR, BILIRUBINUR, KETONESUR, PROTEINUR, UROBILINOGEN, NITRITE, LEUKOCYTESUR in the last 72 hours.  Invalid input(s): APPERANCEUR  Imaging: No results found.   Medications:    cefTRIAXone  (ROCEPHIN )  IV Stopped (06/30/24 2049)   vancomycin       amLODipine   2.5 mg Oral QHS   carvedilol   25 mg Oral BID WC   Chlorhexidine  Gluconate Cloth  6 each Topical Q0600   cholecalciferol   2,000 Units Oral Q1400   ferrous sulfate   325 mg Oral QHS   fluticasone  furoate-vilanterol  1 puff Inhalation Daily   heparin   5,000 Units Subcutaneous Q8H   levothyroxine   112 mcg Oral Q0600   nicotine   21 mg  Transdermal Daily   pantoprazole   40 mg Oral Daily   pneumococcal 20-valent conjugate vaccine  0.5 mL Intramuscular Tomorrow-1000   rosuvastatin   20 mg Oral QHS   acetaminophen , albuterol , dextromethorphan-guaiFENesin , hydrALAZINE , ondansetron  (ZOFRAN ) IV, oxyCODONE -acetaminophen   Assessment/ Plan:  [name] is a 73 y.o.  male with past medical conditions including tobacco abuse, hypothyroidism, hyperlipidemia, hypertension, and end-stage renal disease on hemodialysis. Patient reports to the emergency department for evaluation of left leg swelling. He has been admitted for Cellulitis of left lower extremity [L03.116] Left leg cellulitis [L03.116]  [facility] TTS Lt AVF  End stage renal disease on hemodialysis. Scheduled to receive dialysis today, Will maintain outpatient schedule during this admission.   2. Cellulitis, left lower extremity, Blood cultures negative to date. Continue ceftriaxone  and vancomycin  with dialysis.   3. Anemia of chronic kidney disease Lab Results  Component Value Date   HGB 10.9 (L) 06/30/2024    Hemoglobin within optimal range  4. Hypertension with chronic kidney disease. Receiving amlodipine  and carvedilol . Blood pressure stable for this patient  5. Secondary Hyperparathyroidism: with outpatient labs:Unavailable Lab Results  Component Value Date   CALCIUM  9.4 06/30/2024    Will obtain updated labs during dialysis.    LOS: 1 Tyrone Moore 8/21/202511:45 AM

## 2024-07-02 ENCOUNTER — Telehealth: Payer: Self-pay | Admitting: *Deleted

## 2024-07-02 LAB — HEPATITIS B SURFACE ANTIBODY, QUANTITATIVE: Hep B S AB Quant (Post): <3.5 m[IU]/mL — ABNORMAL LOW

## 2024-07-02 NOTE — Transitions of Care (Post Inpatient/ED Visit) (Signed)
   07/02/2024  Name: Tyrone Moore. MRN: [id number] DOB: May 09, 1951  Today's TOC FU Call Status: Today's TOC FU Call Status:: Unsuccessful Call (1st Attempt) Unsuccessful Call (1st Attempt) Date: 07/02/24  Attempted to reach the patient regarding the most recent Inpatient visit.  Phone range > 10 times without physical or voice mail pick up; unable to leave voice message   Follow Up Plan: Additional outreach attempts will be made to reach the patient to complete the Transitions of Care (Post Inpatient/ED visit) call.   Pls call/ message for questions,  Sindee Stucker Mckinney Wandy Bossler, RN, BSN, CCRN Alumnus RN Care Manager  Transitions of Care  [facility] - [facility] [phone]: direct office

## 2024-07-02 NOTE — Telephone Encounter (Unsigned)
 Copied from CRM [id number]. Topic: General - Other >> Jul 02, 2024 12:18 PM Amy B wrote: Reason for CRM: Patient states he missed a call from the nurse.  He requests a call back.  Advised patient to keep phone near him so he won't miss it again.

## 2024-07-02 NOTE — Transitions of Care (Post Inpatient/ED Visit) (Signed)
 07/02/2024  Name: Tyrone Moore. MRN: [id number] DOB: Sep 29, 1951  Today's TOC FU Call Status: Today's TOC FU Call Status:: Successful TOC FU Call Completed TOC FU Call Complete Date: 07/02/24 Patient's Name and Date of Birth confirmed.  Transition Care Management Follow-up Telephone Call Date of Discharge: 07/01/24 Discharge Facility: [facility]) Type of Discharge: Inpatient Admission Primary Inpatient Discharge Diagnosis:: (L) lower extremity cellulitis How have you been since you were released from the hospital?: Better (I am doing fine, feeling good!  The swelling is getting much better and I am taking the antibiotics like they told me to.  My daughter-in-law is a Engineer, civil (consulting) and she watched me like a hwk- I don't need anybody else checking in I have too much care as it is) Any questions or concerns?: No  Items Reviewed: Did you receive and understand the discharge instructions provided?: Yes (thoroughly reviewed with patient who verbalizes good understanding of same) Medications obtained,verified, and reconciled?: Yes (Medications Reviewed) (Full medication reconciliation/ review completed; no concerns or discrepancies identified; confirmed patient obtained/ is taking all newly Rx'd medications as instructed; self-manages medications and denies questions/ concerns around medications today) Any new allergies since your discharge?: No Dietary orders reviewed?: Yes Type of Diet Ordered:: Regular Do you have support at home?: Yes People in Home [RPT]: alone Name of Support/Comfort Primary Source: Reports independent in self-care activities; resides alone- local son and daughter-in-law assists as/ if needed/ indicated  Medications Reviewed Today: Medications Reviewed Today     Reviewed by Aidynn Polendo M, RN (Registered Nurse) on 07/02/24 at 1546  Med List Status: <None>   Medication Order Taking? Sig Documenting Provider Last Dose Status Informant   acetaminophen  (TYLENOL ) 500 MG tablet [id number] Yes Take 1,000 mg by mouth every 6 (six) hours as needed for moderate pain. [provider]  Active Self, Pharmacy Records  albuterol  (VENTOLIN  HFA) 108 (90 Base) MCG/ACT inhaler [id number] Yes Inhale 2 puffs into the lungs every 6 (six) hours as needed for wheezing or shortness of breath. [name], MD  Active Self, Pharmacy Records  amLODipine  (NORVASC ) 2.5 MG tablet [id number] Yes Take 2.5 mg by mouth at bedtime. [provider]  Active Self, Pharmacy Records  amoxicillin -clavulanate (AUGMENTIN ) 500-125 MG tablet [id number] Yes Take 1 tablet by mouth every 12 (twelve) hours for 6 days. Laurita Pillion, MD  Active   carvedilol  (COREG ) 25 MG tablet [id number] Yes TAKE 1 TABLET BY MOUTH TWICE DAILY WITH A MEAL Berglund, Laura H, MD  Active Self, Pharmacy Records  Cholecalciferol  (D3-1000 PO) [id number] Yes Take 2,000 Units by mouth at bedtime. [provider]  Active Self, Pharmacy Records  doxycycline  (VIBRA -TABS) 100 MG tablet [id number] Yes Take 1 tablet (100 mg total) by mouth 2 (two) times daily for 6 days. Laurita Pillion, MD  Active   ferrous sulfate  325 (65 FE) MG tablet [id number]  Take 325 mg by mouth at bedtime.  Patient not taking: Reported on 07/02/2024   [provider]  Active Self, Pharmacy Records  fluticasone -salmeterol (WIXELA INHUB ) 250-50 MCG/ACT AEPB [id number] Yes Inhale 1 puff into the lungs 2 (two) times daily. Laurita Pillion, MD  Active   levothyroxine  (SYNTHROID ) 112 MCG tablet [id number] Yes TAKE 1 TABLET BY MOUTH BEFORE BREAKFAST Berglund, Laura H, MD  Active Self, Pharmacy Records  lidocaine -prilocaine  (EMLA ) cream [id number] Yes Apply 1 Application topically as directed. [provider]  Active Self, Pharmacy Records  Multiple Vitamins-Minerals (MULTIVITAL-M PO) [id number] Yes Take 1 tablet by mouth  at bedtime. [provider]  Active Self, Pharmacy Records  omeprazole  [facility])  20 MG capsule [id number] Yes Take 1 capsule by mouth once daily Berglund, Laura H, MD  Active Self, Pharmacy Records  ondansetron  (ZOFRAN -ODT) 4 MG disintegrating tablet [id number] Yes Take 4 mg by mouth every 6 (six) hours as needed. [provider]  Active Self, Pharmacy Records  rosuvastatin  (CRESTOR ) 20 MG tablet [id number] Yes Take 1 tablet (20 mg total) by mouth at bedtime. [name], MD  Active Self, Pharmacy Records           Home Care and Equipment/Supplies: Were Home Health Services Ordered?: No Any new equipment or medical supplies ordered?: No  Functional Questionnaire: Do you need assistance with bathing/showering or dressing?: No Do you need assistance with meal preparation?: No Do you need assistance with eating?: No Do you have difficulty maintaining continence: No Do you need assistance with getting out of bed/getting out of a chair/moving?: No Do you have difficulty managing or taking your medications?: No  Follow up appointments reviewed: PCP Follow-up appointment confirmed?: Yes Date of PCP follow-up appointment?: 07/05/24 Follow-up Provider: PCP- Dr. Justus Specialist [facility] Follow-up appointment confirmed?: Yes Date of Specialist follow-up appointment?:  (I will see the kidney doctor next week when I go to hemodialysis: I see the doctor once a month at hemodailysis and the nurse practicioner every 2 weeks- they come to the dialysis center) Follow-Up Specialty Provider:: Renal provider Do you need transportation to your follow-up appointment?: No Do you understand care options if your condition(s) worsen?: Yes-patient verbalized understanding  SDOH Interventions Today    Flowsheet Row Most Recent Value  SDOH Interventions   Food Insecurity Interventions Intervention Not Indicated  Housing Interventions Intervention Not Indicated  Transportation Interventions Intervention Not Indicated  [drives self]  Utilities Interventions Intervention  Not Indicated  [Today, patient denies insecurity around utilities]   See TOC assessment tabs for additional assessment/ TOC intervention information  Patient declines need for ongoing/ further care management/ coordination outreach; declines enrollment in 30-day TOC program- declines taking my direct phone number should needs/ concerns arise post-TOC call   Pls call/ message for questions,  Noris Kulinski Mckinney Tyshan Enderle, RN, BSN, CCRN Alumnus RN Care Manager  Transitions of Care  [facility] - [facility] [phone]: direct office

## 2024-07-03 DIAGNOSIS — Z992 Dependence on renal dialysis: Secondary | ICD-10-CM | POA: Diagnosis not present

## 2024-07-03 DIAGNOSIS — N186 End stage renal disease: Secondary | ICD-10-CM | POA: Diagnosis not present

## 2024-07-03 DIAGNOSIS — L03116 Cellulitis of left lower limb: Secondary | ICD-10-CM | POA: Diagnosis not present

## 2024-07-04 LAB — CULTURE, BLOOD (SINGLE)
Culture: NO GROWTH
Special Requests: ADEQUATE

## 2024-07-05 ENCOUNTER — Ambulatory Visit: Admitting: Internal Medicine

## 2024-07-05 ENCOUNTER — Encounter: Payer: Self-pay | Admitting: Internal Medicine

## 2024-07-05 VITALS — BP 142/70 | HR 80 | Ht 71.0 in | Wt 202.0 lb

## 2024-07-05 DIAGNOSIS — J449 Chronic obstructive pulmonary disease, unspecified: Secondary | ICD-10-CM

## 2024-07-05 DIAGNOSIS — L03116 Cellulitis of left lower limb: Secondary | ICD-10-CM | POA: Diagnosis not present

## 2024-07-05 DIAGNOSIS — I10 Essential (primary) hypertension: Secondary | ICD-10-CM | POA: Diagnosis not present

## 2024-07-05 MED ORDER — CARVEDILOL 25 MG PO TABS: 25.0000 mg | ORAL_TABLET | Freq: Two times a day (BID) | ORAL | 1 refills | Status: DC

## 2024-07-05 NOTE — Assessment & Plan Note (Signed)
 Encouraged to cut back on cigarettes but he is not interested. Continue Wixela bid

## 2024-07-05 NOTE — Assessment & Plan Note (Signed)
 continue to elevate; finish antibiotics return if worsening see Podiatry as planned.

## 2024-07-05 NOTE — Progress Notes (Signed)
 Date:  07/05/2024   Name:  Tyrone Moore.   DOB:  01/06/1951   MRN:  [id number]   Chief Complaint: Hospitalization Follow-up (Left Leg Cellulitis Follow Up.) Admitted to [facility] with cellulitis on  06/29/24 to 08/01/24.  TOc call done on 07/02/24.  HPI Hospital Course: Tyrone Moore. is a 73 y.o. male with medical history significant of ESRD-HD (TTS), HTN, HLD, stroke, COPD, GERD, hypothyroidism, BPH, chronic back pain, tobacco abuse, anemia, who presents with left lower leg swelling, redness and pain.  Patient placed on vancomycin  and Rocephin  for cellulitis. Condition improving, patient will receive the final dose of vancomycin  after discharge. He will be treated additionally with doxy and augmentin .  Assessment and Plan:   Cellulitis of left lower extremity: Failed outpatient antibiotic treatment.  Placed on vancomycin  and Rocephin . Seems improving. Patient will receive 1 more dose of vancomycin  after HD, its effect will last until Saturday when he is dialyzed again. Also added doxycycline  and augmentin  for 7 days.   >>> he is trying to elevate as much as possible.  Leg pain is improved.  Blood cultures are negative. Taking both antibiotics without side effects for 2 more days.  He believes that Dr. Marcelino ordered IV Vanc after HD tomorrow.  He will continue to monitor for improvement.  He sees podiatry for some superficial foot lesions next week.  COPD. Tobacco abuse. Patient has some wheezes, he states that this is a chronic.  He has been smoking all his life, he is adamant that he will not quit smoking. At this point, I will continue bronchodilator as needed, also added Breo.  >>> he felt better and breathed more easily while in the hospital on the nicotine  patch.  However, he is back to smoking cigarettes.  He has no interested in quitting.  He is using Wixela about once a day when he thinks of if.  ESRD on dialysis (TTS): Anemia of end-stage renal disease. Followed by nephrology  for continued dialysis. Hemoglobin stable.   Essential hypertension Home medicines continued.  >>> blood pressure remained controlled during his stay.  Acquired hypothyroidism -Synthroid      History of stroke -Crestor  -Patient is not taking Plavix  currently     HLD (hyperlipidemia) -Crestor    Tobacco use disorder -Nicotine  patch   Overweight (BMI 25.0-29.9): Body weight 94.3 kg, BMI 29.01 -Encourage losing weight - Exercise and healthy diet   Mild thrombocytopenia.     Review of Systems  Constitutional:  Negative for chills and fever.  Respiratory:  Positive for cough, shortness of breath and wheezing. Negative for chest tightness.   Cardiovascular:  Positive for leg swelling. Negative for chest pain and palpitations.  Gastrointestinal:  Negative for constipation, diarrhea, nausea and vomiting.  Neurological:  Negative for dizziness and headaches.  Psychiatric/Behavioral:  Negative for dysphoric mood and sleep disturbance. The patient is not nervous/anxious.      Lab Results  Component Value Date   NA 143 07/01/2024   K 4.8 07/01/2024   CO2 23 07/01/2024   GLUCOSE 115 (H) 07/01/2024   BUN 39 (H) 07/01/2024   CREATININE 5.05 (H) 07/01/2024   CALCIUM  9.4 07/01/2024   EGFR 12 02/26/2023   GFRNONAA 11 (L) 07/01/2024   Lab Results  Component Value Date   CHOL 140 03/31/2024   HDL 36 (L) 03/31/2024   LDLCALC 78 03/31/2024   TRIG 129 03/31/2024   CHOLHDL 3.9 03/31/2024   Lab Results  Component Value Date   TSH  2.067 03/30/2024   Lab Results  Component Value Date   HGBA1C 5.8 (H) 03/19/2023   Lab Results  Component Value Date   WBC 5.7 07/01/2024   HGB 10.3 (L) 07/01/2024   HCT 31.5 (L) 07/01/2024   MCV 101.9 (H) 07/01/2024   PLT 134 (L) 07/01/2024   Lab Results  Component Value Date   ALT 17 06/29/2024   AST 24 06/29/2024   ALKPHOS 123 06/29/2024   BILITOT 0.8 06/29/2024   No results found for: Tyrone Moore, VD25OH   Patient  Active Problem List   Diagnosis Date Noted   Left leg cellulitis 06/30/2024   Cellulitis of left lower extremity 06/29/2024   HLD (hyperlipidemia) 06/29/2024   Overweight (BMI 25.0-29.9) 06/29/2024   CVA (cerebral vascular accident) (HCC) 03/30/2024   ESRD on dialysis (HCC) 09/16/2023   Chronic kidney disease (CKD), stage V (HCC) 04/15/2023   Prediabetes 09/17/2022   GERD without esophagitis 09/17/2022   History of stroke 09/01/2021   Anemia in chronic kidney disease 08/15/2021   Cyst of kidney, acquired 08/15/2021   Hyperparathyroidism due to renal insufficiency (HCC) 08/15/2021   Chronic obstructive pulmonary disease, unspecified (HCC) 07/11/2021   Proteinuria, unspecified 05/16/2021   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   Mixed hyperlipidemia 03/26/2021   Acquired hypothyroidism 03/26/2021   Essential hypertension 03/26/2021   Tobacco use disorder 03/26/2021   Chronic bilateral low back pain without sciatica 03/26/2021    No Known Allergies  Past Surgical History:  Procedure Laterality Date   A/V FISTULAGRAM N/A 06/14/2024   Procedure: A/V Fistulagram;  Surgeon: Marea Selinda RAMAN, MD;  Location: [facility] INVASIVE CV LAB;  Service: Cardiovascular;  Laterality: N/A;   AV FISTULA PLACEMENT Left 05/01/2023   Procedure: ARTERIOVENOUS (AV) FISTULA CREATION ( RADIOCEPHALIC);  Surgeon: Marea Selinda RAMAN, MD;  Location: [facility] ORS;  Service: Vascular;  Laterality: Left;   CYSTOSCOPY W/ RETROGRADES Bilateral 07/03/2021   Procedure: CYSTOSCOPY WITH RETROGRADE PYELOGRAM;  Surgeon: Penne Knee, MD;  Location: [facility] ORS;  Service: Urology;  Laterality: Bilateral;   CYSTOSCOPY WITH LITHOLAPAXY N/A 07/03/2021   Procedure: CYSTOSCOPY WITH LITHOLAPAXY;  Surgeon: Penne Knee, MD;  Location: [facility] ORS;  Service: Urology;  Laterality: N/A;   HOLEP-LASER ENUCLEATION OF THE PROSTATE WITH MORCELLATION N/A 07/03/2021   Procedure: HOLEP-LASER ENUCLEATION OF THE PROSTATE WITH MORCELLATION;   Surgeon: Penne Knee, MD;  Location: [facility] ORS;  Service: Urology;  Laterality: N/A;   SPINE SURGERY  2015   X 4 surgery - lumbar disc    Social History   Tobacco Use   Smoking status: Heavy Smoker    Current packs/day: 1.00    Average packs/day: 1 pack/day for 54.0 years (54.0 ttl pk-yrs)    Types: Cigarettes    Passive exposure: Current   Smokeless tobacco: Never  Vaping Use   Vaping status: Never Used  Substance Use Topics   Alcohol use: Not Currently    Comment: quit drinking at 19   Drug use: Never     Medication list has been reviewed and updated.  Current Meds  Medication Sig   acetaminophen  (TYLENOL ) 500 MG tablet Take 1,000 mg by mouth every 6 (six) hours as needed for moderate pain.   albuterol  (VENTOLIN  HFA) 108 (90 Base) MCG/ACT inhaler Inhale 2 puffs into the lungs every 6 (six) hours as needed for wheezing or shortness of breath.   amLODipine  (NORVASC ) 2.5 MG tablet Take 2.5 mg by mouth at bedtime.   amoxicillin -clavulanate (AUGMENTIN ) 500-125 MG tablet Take 1  tablet by mouth every 12 (twelve) hours for 6 days.   Cholecalciferol  (D3-1000 PO) Take 2,000 Units by mouth at bedtime.   doxycycline  (VIBRA -TABS) 100 MG tablet Take 1 tablet (100 mg total) by mouth 2 (two) times daily for 6 days.   ferrous sulfate  325 (65 FE) MG tablet Take 325 mg by mouth at bedtime.   fluticasone -salmeterol (WIXELA INHUB ) 250-50 MCG/ACT AEPB Inhale 1 puff into the lungs 2 (two) times daily.   levothyroxine  (SYNTHROID ) 112 MCG tablet TAKE 1 TABLET BY MOUTH BEFORE BREAKFAST   lidocaine -prilocaine  (EMLA ) cream Apply 1 Application topically as directed.   Multiple Vitamins-Minerals (MULTIVITAL-M PO) Take 1 tablet by mouth at bedtime.   omeprazole  (PRILOSEC) 20 MG capsule Take 1 capsule by mouth once daily   ondansetron  (ZOFRAN -ODT) 4 MG disintegrating tablet Take 4 mg by mouth every 6 (six) hours as needed.   rosuvastatin  (CRESTOR ) 20 MG tablet Take 1 tablet (20 mg total) by mouth at  bedtime.   [DISCONTINUED] carvedilol  (COREG ) 25 MG tablet TAKE 1 TABLET BY MOUTH TWICE DAILY WITH A MEAL       07/05/2024    1:47 PM 06/29/2024    1:35 PM 06/23/2024    8:48 AM 05/19/2024    2:40 PM  GAD 7 : Generalized Anxiety Score  Nervous, Anxious, on Edge 1 2 2  0  Control/stop worrying 1 2 2  0  Worry too much - different things 1 2 2  0  Trouble relaxing 1 2 2  0  Restless 1 2 2  0  Easily annoyed or irritable 1 2 2  0  Afraid - awful might happen 1 1 1  0  Total GAD 7 Score 7 13 13  0  Anxiety Difficulty Somewhat difficult Somewhat difficult Somewhat difficult Not difficult at all       07/05/2024    1:47 PM 06/29/2024    1:34 PM 06/23/2024    8:48 AM  Depression screen PHQ 2/9  Decreased Interest 0 1 1  Down, Depressed, Hopeless 0 1 1  PHQ - 2 Score 0 2 2  Altered sleeping 0 1 2  Tired, decreased energy 0 1 2  Change in appetite 0 1 2  Feeling bad or failure about yourself  0 1 1  Trouble concentrating 0 1 2  Moving slowly or fidgety/restless 0 1 1  Suicidal thoughts 0 0 0  PHQ-9 Score 0 8 12  Difficult doing work/chores Not difficult at all Somewhat difficult Not difficult at all    BP Readings from Last 3 Encounters:  07/05/24 (!) 142/70  07/01/24 (!) 155/69  06/29/24 136/72    Physical Exam Vitals and nursing note reviewed.  Constitutional:      General: He is not in acute distress.    Appearance: Normal appearance. He is well-developed.  HENT:     Head: Normocephalic and atraumatic.  Cardiovascular:     Rate and Rhythm: Normal rate and regular rhythm.  Pulmonary:     Effort: Pulmonary effort is normal. No respiratory distress.     Breath sounds: Wheezing present. No rhonchi.  Musculoskeletal:     Left lower leg: Tenderness present. 1+ Edema present.     Comments: Redness improved from last week.  No posterior knee tenderness to suggest that the Baker's cyst is symptomatic.  Skin:    General: Skin is warm and dry.     Findings: No rash.  Neurological:      Mental Status: He is alert and oriented to person, place, and time.  Psychiatric:        Mood and Affect: Mood normal.        Behavior: Behavior normal.     Wt Readings from Last 3 Encounters:  07/05/24 202 lb (91.6 kg)  07/01/24 196 lb 13.9 oz (89.3 kg)  06/29/24 208 lb (94.3 kg)    BP (!) 142/70   Pulse 80   Ht 5' 11 (1.803 m)   Wt 202 lb (91.6 kg)   SpO2 96%   BMI 28.17 kg/m   Assessment and Plan:  Problem List Items Addressed This Visit       Unprioritized   Essential hypertension (Chronic)   Blood pressure is well controlled on Coreg  and amlodipine   No medication side effects noted. Plan to continue current medications.       Relevant Medications   carvedilol  (COREG ) 25 MG tablet   Chronic obstructive pulmonary disease, unspecified (HCC) (Chronic)   Encouraged to cut back on cigarettes but he is not interested. Continue Wixela bid       Cellulitis of left lower extremity - Primary   continue to elevate; finish antibiotics return if worsening see Podiatry as planned.        No follow-ups on file.    Leita HILARIO Adie, MD [facility] [place]

## 2024-07-05 NOTE — Assessment & Plan Note (Signed)
 Blood pressure is well controlled on Coreg  and amlodipine   No medication side effects noted. Plan to continue current medications.

## 2024-07-06 DIAGNOSIS — Z992 Dependence on renal dialysis: Secondary | ICD-10-CM | POA: Diagnosis not present

## 2024-07-06 DIAGNOSIS — N186 End stage renal disease: Secondary | ICD-10-CM | POA: Diagnosis not present

## 2024-07-06 DIAGNOSIS — L03116 Cellulitis of left lower limb: Secondary | ICD-10-CM | POA: Diagnosis not present

## 2024-07-08 DIAGNOSIS — Z992 Dependence on renal dialysis: Secondary | ICD-10-CM | POA: Diagnosis not present

## 2024-07-08 DIAGNOSIS — N186 End stage renal disease: Secondary | ICD-10-CM | POA: Diagnosis not present

## 2024-07-08 DIAGNOSIS — L03116 Cellulitis of left lower limb: Secondary | ICD-10-CM | POA: Diagnosis not present

## 2024-07-10 DIAGNOSIS — Z992 Dependence on renal dialysis: Secondary | ICD-10-CM | POA: Diagnosis not present

## 2024-07-10 DIAGNOSIS — N186 End stage renal disease: Secondary | ICD-10-CM | POA: Diagnosis not present

## 2024-07-10 DIAGNOSIS — L03116 Cellulitis of left lower limb: Secondary | ICD-10-CM | POA: Diagnosis not present

## 2024-07-11 DIAGNOSIS — N186 End stage renal disease: Secondary | ICD-10-CM | POA: Diagnosis not present

## 2024-07-11 DIAGNOSIS — Z992 Dependence on renal dialysis: Secondary | ICD-10-CM | POA: Diagnosis not present

## 2024-07-14 DIAGNOSIS — M79675 Pain in left toe(s): Secondary | ICD-10-CM | POA: Diagnosis not present

## 2024-07-14 DIAGNOSIS — B351 Tinea unguium: Secondary | ICD-10-CM | POA: Diagnosis not present

## 2024-07-14 DIAGNOSIS — M79674 Pain in right toe(s): Secondary | ICD-10-CM | POA: Diagnosis not present

## 2024-07-15 DIAGNOSIS — N186 End stage renal disease: Secondary | ICD-10-CM | POA: Diagnosis not present

## 2024-07-15 DIAGNOSIS — L02416 Cutaneous abscess of left lower limb: Secondary | ICD-10-CM | POA: Diagnosis not present

## 2024-07-15 DIAGNOSIS — L03116 Cellulitis of left lower limb: Secondary | ICD-10-CM | POA: Diagnosis not present

## 2024-07-15 DIAGNOSIS — Z992 Dependence on renal dialysis: Secondary | ICD-10-CM | POA: Diagnosis not present

## 2024-07-20 DIAGNOSIS — L03116 Cellulitis of left lower limb: Secondary | ICD-10-CM | POA: Diagnosis not present

## 2024-07-20 DIAGNOSIS — Z992 Dependence on renal dialysis: Secondary | ICD-10-CM | POA: Diagnosis not present

## 2024-07-20 DIAGNOSIS — N186 End stage renal disease: Secondary | ICD-10-CM | POA: Diagnosis not present

## 2024-07-20 DIAGNOSIS — L02416 Cutaneous abscess of left lower limb: Secondary | ICD-10-CM | POA: Diagnosis not present

## 2024-07-21 ENCOUNTER — Other Ambulatory Visit (INDEPENDENT_AMBULATORY_CARE_PROVIDER_SITE_OTHER): Payer: Self-pay | Admitting: Nurse Practitioner

## 2024-07-21 DIAGNOSIS — N186 End stage renal disease: Secondary | ICD-10-CM

## 2024-07-22 DIAGNOSIS — Z992 Dependence on renal dialysis: Secondary | ICD-10-CM | POA: Diagnosis not present

## 2024-07-22 DIAGNOSIS — L02416 Cutaneous abscess of left lower limb: Secondary | ICD-10-CM | POA: Diagnosis not present

## 2024-07-22 DIAGNOSIS — L03116 Cellulitis of left lower limb: Secondary | ICD-10-CM | POA: Diagnosis not present

## 2024-07-22 DIAGNOSIS — N186 End stage renal disease: Secondary | ICD-10-CM | POA: Diagnosis not present

## 2024-07-26 ENCOUNTER — Encounter (INDEPENDENT_AMBULATORY_CARE_PROVIDER_SITE_OTHER)

## 2024-07-26 ENCOUNTER — Ambulatory Visit (INDEPENDENT_AMBULATORY_CARE_PROVIDER_SITE_OTHER): Admitting: Nurse Practitioner

## 2024-07-26 DIAGNOSIS — Z992 Dependence on renal dialysis: Secondary | ICD-10-CM | POA: Diagnosis not present

## 2024-07-26 DIAGNOSIS — L03116 Cellulitis of left lower limb: Secondary | ICD-10-CM | POA: Diagnosis not present

## 2024-07-26 DIAGNOSIS — N186 End stage renal disease: Secondary | ICD-10-CM | POA: Diagnosis not present

## 2024-07-26 DIAGNOSIS — L02416 Cutaneous abscess of left lower limb: Secondary | ICD-10-CM | POA: Diagnosis not present

## 2024-07-28 DIAGNOSIS — Z992 Dependence on renal dialysis: Secondary | ICD-10-CM | POA: Diagnosis not present

## 2024-07-28 DIAGNOSIS — L03116 Cellulitis of left lower limb: Secondary | ICD-10-CM | POA: Diagnosis not present

## 2024-07-28 DIAGNOSIS — L02416 Cutaneous abscess of left lower limb: Secondary | ICD-10-CM | POA: Diagnosis not present

## 2024-07-28 DIAGNOSIS — N186 End stage renal disease: Secondary | ICD-10-CM | POA: Diagnosis not present

## 2024-07-30 DIAGNOSIS — L02416 Cutaneous abscess of left lower limb: Secondary | ICD-10-CM | POA: Diagnosis not present

## 2024-07-30 DIAGNOSIS — N186 End stage renal disease: Secondary | ICD-10-CM | POA: Diagnosis not present

## 2024-07-30 DIAGNOSIS — L03116 Cellulitis of left lower limb: Secondary | ICD-10-CM | POA: Diagnosis not present

## 2024-07-30 DIAGNOSIS — Z992 Dependence on renal dialysis: Secondary | ICD-10-CM | POA: Diagnosis not present

## 2024-08-02 DIAGNOSIS — Z992 Dependence on renal dialysis: Secondary | ICD-10-CM | POA: Diagnosis not present

## 2024-08-02 DIAGNOSIS — L02416 Cutaneous abscess of left lower limb: Secondary | ICD-10-CM | POA: Diagnosis not present

## 2024-08-02 DIAGNOSIS — L03116 Cellulitis of left lower limb: Secondary | ICD-10-CM | POA: Diagnosis not present

## 2024-08-02 DIAGNOSIS — N186 End stage renal disease: Secondary | ICD-10-CM | POA: Diagnosis not present

## 2024-08-03 ENCOUNTER — Ambulatory Visit (INDEPENDENT_AMBULATORY_CARE_PROVIDER_SITE_OTHER): Admitting: Internal Medicine

## 2024-08-03 ENCOUNTER — Encounter: Payer: Self-pay | Admitting: Internal Medicine

## 2024-08-03 VITALS — BP 132/68 | HR 83 | Ht 71.0 in | Wt 208.0 lb

## 2024-08-03 DIAGNOSIS — I739 Peripheral vascular disease, unspecified: Secondary | ICD-10-CM

## 2024-08-03 DIAGNOSIS — M7989 Other specified soft tissue disorders: Secondary | ICD-10-CM | POA: Diagnosis not present

## 2024-08-03 NOTE — Progress Notes (Signed)
 Date:  08/03/2024   Name:  Tyrone Moore.   DOB:  [birth year]   MRN:  [id number]   Chief Complaint: Edema (Follow up on Left Lower Leg.)  Leg Pain  There was no injury mechanism. The pain is present in the left leg. The quality of the pain is described as aching. The pain is mild. The pain has been Improving since onset.  We have ruled out DVT but he does have a Baker's cyst.  He says the swelling is improved but still present.  It does not change during the day.  His knee is stiff if he sits for a while but not very painful.  The mild pink skin discoloration has also improved.  He completed a course of antibiotics given at hospital discharge last month.  He has not had a VS evaluation of his LE circulation.  He feels pretty well currently.    Review of Systems  Constitutional:  Negative for chills, diaphoresis and fatigue.  Respiratory:  Positive for cough, shortness of breath and wheezing. Negative for chest tightness.   Cardiovascular:  Positive for leg swelling. Negative for chest pain and palpitations.  Musculoskeletal:  Positive for arthralgias. Negative for joint swelling.  Neurological:  Negative for dizziness and headaches.  Psychiatric/Behavioral:  Positive for sleep disturbance. Negative for dysphoric mood. The patient is nervous/anxious.      Lab Results  Component Value Date   NA 143 07/01/2024   K 4.8 07/01/2024   CO2 23 07/01/2024   GLUCOSE 115 (H) 07/01/2024   BUN 39 (H) 07/01/2024   CREATININE 5.05 (H) 07/01/2024   CALCIUM  9.4 07/01/2024   EGFR 12 02/26/2023   GFRNONAA 11 (L) 07/01/2024   Lab Results  Component Value Date   CHOL 140 03/31/2024   HDL 36 (L) 03/31/2024   LDLCALC 78 03/31/2024   TRIG 129 03/31/2024   CHOLHDL 3.9 03/31/2024   Lab Results  Component Value Date   TSH 2.067 03/30/2024   Lab Results  Component Value Date   HGBA1C 5.8 (H) 03/19/2023   Lab Results  Component Value Date   WBC 5.7 07/01/2024   HGB 10.3 (L) 07/01/2024    HCT 31.5 (L) 07/01/2024   MCV 101.9 (H) 07/01/2024   PLT 134 (L) 07/01/2024   Lab Results  Component Value Date   ALT 17 06/29/2024   AST 24 06/29/2024   ALKPHOS 123 06/29/2024   BILITOT 0.8 06/29/2024   No results found for: MARIEN BOLLS, VD25OH   Patient Active Problem List   Diagnosis Date Noted   Left leg swelling 08/03/2024   Left leg cellulitis 06/30/2024   Cellulitis of left lower extremity 06/29/2024   HLD (hyperlipidemia) 06/29/2024   Overweight (BMI 25.0-29.9) 06/29/2024   CVA (cerebral vascular accident) (HCC) 03/30/2024   ESRD on dialysis (HCC) 09/16/2023   Chronic kidney disease (CKD), stage V (HCC) 04/15/2023   Prediabetes 09/17/2022   GERD without esophagitis 09/17/2022   History of stroke 09/01/2021   Anemia in chronic kidney disease 08/15/2021   Cyst of kidney, acquired 08/15/2021   Hyperparathyroidism due to renal insufficiency 08/15/2021   Chronic obstructive pulmonary disease, unspecified (HCC) 07/11/2021   Proteinuria, unspecified 05/16/2021   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   Mixed hyperlipidemia 03/26/2021   Acquired hypothyroidism 03/26/2021   Essential hypertension 03/26/2021   Tobacco use disorder 03/26/2021   Chronic bilateral low back pain without sciatica 03/26/2021    No Known Allergies  Past Surgical History:  Procedure Laterality Date   A/V FISTULAGRAM N/A 06/14/2024   Procedure: A/V Fistulagram;  Surgeon: Marea Selinda RAMAN, MD;  Location: [facility];  Service: Cardiovascular;  Laterality: N/A;   AV FISTULA PLACEMENT Left 05/01/2023   Procedure: ARTERIOVENOUS (AV) FISTULA CREATION ( RADIOCEPHALIC);  Surgeon: Marea Selinda RAMAN, MD;  Location: [facility] ORS;  Service: Vascular;  Laterality: Left;   CYSTOSCOPY W/ RETROGRADES Bilateral 07/03/2021   Procedure: CYSTOSCOPY WITH RETROGRADE PYELOGRAM;  Surgeon: Penne Knee, MD;  Location: [facility] OR[facility];  Service: Urology;  Laterality: Bilateral;   CYSTOSCOPY  WITH LITHOLAPAXY N/A 07/03/2021   Procedure: CYSTOSCOPY WITH LITHOLAPAXY;  Surgeon: Penne Knee, MD;  Location: [facility] OR[facility];  Service: Urology;  Laterality: N/A;   HOLEP-LASER ENUCLEATION OF THE PROSTATE WITH MORCELLATION N/A 07/03/2021   Procedure: HOLEP-LASER ENUCLEATION OF THE PROSTATE WITH MORCELLATION;  Surgeon: Penne Knee, MD;  Location: [facility] OR[facility];  Service: Urology;  Laterality: N/A;   SPINE SURGERY  2015   X 4 surgery - lumbar disc    Social History   Tobacco Use   Smoking status: Heavy Smoker    Current packs/day: 1.00    Average packs/day: 1 pack/day for 54.0 years (54.0 ttl pk-yrs)    Types: Cigarettes    Passive exposure: Current   Smokeless tobacco: Never  Vaping Use   Vaping status: Never Used  Substance Use Topics   Alcohol use: Not Currently    Comment: quit drinking at 19   Drug use: Never     Medication list has been reviewed and updated.  Current Meds  Medication Sig   acetaminophen  (TYLENOL ) 500 MG tablet Take 1,000 mg by mouth every 6 (six) hours as needed for moderate pain.   albuterol  (VENTOLIN  HFA) 108 (90 Base) MCG/ACT inhaler Inhale 2 puffs into the lungs every 6 (six) hours as needed for wheezing or shortness of breath.   amLODipine  (NORVASC ) 2.5 MG tablet Take 2.5 mg by mouth at bedtime.   Aspirin -Calcium  Carbonate 81-777 MG TABS Take 81 mg by mouth daily.   carvedilol  (COREG ) 25 MG tablet Take 1 tablet (25 mg total) by mouth 2 (two) times daily with a meal.   Cholecalciferol  (D3-1000 PO) Take 2,000 Units by mouth at bedtime.   ferrous sulfate  325 (65 FE) MG tablet Take 325 mg by mouth at bedtime.   fluticasone -salmeterol (WIXELA INHUB ) 250-50 MCG/ACT AEPB Inhale 1 puff into the lungs 2 (two) times daily.   levothyroxine  (SYNTHROID ) 112 MCG tablet TAKE 1 TABLET BY MOUTH BEFORE BREAKFAST   lidocaine -prilocaine  (EMLA ) cream Apply 1 Application topically as directed.   lisinopril (ZESTRIL) 10 MG tablet Take 10 mg by mouth daily.   magnesium   gluconate (MAGONATE) 500 MG tablet Take 500 mg by mouth.   Multiple Vitamins-Minerals (MULTIVITAL-M PO) Take 1 tablet by mouth at bedtime.   omeprazole  (PRILOSEC) 20 MG capsule Take 1 capsule by mouth once daily   ondansetron  (ZOFRAN -ODT) 4 MG disintegrating tablet Take 4 mg by mouth every 6 (six) hours as needed.   rosuvastatin  (CRESTOR ) 20 MG tablet Take 1 tablet (20 mg total) by mouth at bedtime.       08/03/2024    3:47 PM 07/05/2024    1:47 PM 06/29/2024    1:35 PM 06/23/2024    8:48 AM  GAD 7 : Generalized Anxiety Score  Nervous, Anxious, on Edge 1 1 2 2   Control/stop worrying 1 1 2 2   Worry too much - different things 1 1 2 2   Trouble relaxing 0 1 2  2  Restless 0 1 2 2   Easily annoyed or irritable 0 1 2 2   Afraid - awful might happen 0 1 1 1   Total GAD 7 Score 3 7 13 13   Anxiety Difficulty Somewhat difficult Somewhat difficult Somewhat difficult Somewhat difficult       08/03/2024    3:47 PM 07/05/2024    1:47 PM 06/29/2024    1:34 PM  Depression screen PHQ 2/9  Decreased Interest 0 0 1  Down, Depressed, Hopeless 0 0 1  PHQ - 2 Score 0 0 2  Altered sleeping 0 0 1  Tired, decreased energy 0 0 1  Change in appetite 0 0 1  Feeling bad or failure about yourself  0 0 1  Trouble concentrating 0 0 1  Moving slowly or fidgety/restless 0 0 1  Suicidal thoughts 0 0 0  PHQ-9 Score 0 0 8  Difficult doing work/chores Not difficult at all Not difficult at all Somewhat difficult    BP Readings from Last 3 Encounters:  08/03/24 132/68  07/05/24 (!) 142/70  07/01/24 (!) 155/69    Physical Exam Vitals and nursing note reviewed.  Constitutional:      General: He is not in acute distress.    Appearance: Normal appearance. He is well-developed.  HENT:     Head: Normocephalic and atraumatic.  Cardiovascular:     Rate and Rhythm: Normal rate and regular rhythm.  Pulmonary:     Effort: Pulmonary effort is normal. No respiratory distress.     Breath sounds: No wheezing or  rhonchi.  Musculoskeletal:        General: Swelling and tenderness (mild left calf tenderness) present.     Cervical back: Normal range of motion.  Skin:    General: Skin is warm and dry.     Findings: No rash.  Neurological:     General: No focal deficit present.     Mental Status: He is alert and oriented to person, place, and time.  Psychiatric:        Mood and Affect: Mood normal.        Behavior: Behavior normal.     Wt Readings from Last 3 Encounters:  08/03/24 208 lb (94.3 kg)  07/05/24 202 lb (91.6 kg)  07/01/24 196 lb 13.9 oz (89.3 kg)    BP 132/68   Pulse 83   Ht 5' 11 (1.803 m)   Wt 208 lb (94.3 kg)   SpO2 95%   BMI 29.01 kg/m   Assessment and Plan:  Problem List Items Addressed This Visit       Unprioritized   Left leg swelling - Primary   Probably due to Baker's cyst which is improving. Continue elevation, tylenol  for pain. Will refer to VS for LE vascular studies for likely PAD      Relevant Orders   Ambulatory referral to Vascular Surgery   Other Visit Diagnoses       PAD (peripheral artery disease)       Relevant Medications   lisinopril (ZESTRIL) 10 MG tablet   Aspirin -Calcium  Carbonate 81-777 MG TABS   Other Relevant Orders   Ambulatory referral to Vascular Surgery       No follow-ups on file.    Leita HILARIO Adie, MD [facility] [place]

## 2024-08-03 NOTE — Assessment & Plan Note (Signed)
 Probably due to Baker's cyst which is improving. Continue elevation, tylenol  for pain. Will refer to VS for LE vascular studies for likely PAD

## 2024-08-04 DIAGNOSIS — N186 End stage renal disease: Secondary | ICD-10-CM | POA: Diagnosis not present

## 2024-08-04 DIAGNOSIS — Z992 Dependence on renal dialysis: Secondary | ICD-10-CM | POA: Diagnosis not present

## 2024-08-04 DIAGNOSIS — L03116 Cellulitis of left lower limb: Secondary | ICD-10-CM | POA: Diagnosis not present

## 2024-08-04 DIAGNOSIS — L02416 Cutaneous abscess of left lower limb: Secondary | ICD-10-CM | POA: Diagnosis not present

## 2024-08-06 DIAGNOSIS — N186 End stage renal disease: Secondary | ICD-10-CM | POA: Diagnosis not present

## 2024-08-06 DIAGNOSIS — Z992 Dependence on renal dialysis: Secondary | ICD-10-CM | POA: Diagnosis not present

## 2024-08-06 DIAGNOSIS — L03116 Cellulitis of left lower limb: Secondary | ICD-10-CM | POA: Diagnosis not present

## 2024-08-06 DIAGNOSIS — L02416 Cutaneous abscess of left lower limb: Secondary | ICD-10-CM | POA: Diagnosis not present

## 2024-08-07 ENCOUNTER — Other Ambulatory Visit: Payer: Self-pay | Admitting: Internal Medicine

## 2024-08-07 DIAGNOSIS — E039 Hypothyroidism, unspecified: Secondary | ICD-10-CM

## 2024-08-09 DIAGNOSIS — N186 End stage renal disease: Secondary | ICD-10-CM | POA: Diagnosis not present

## 2024-08-09 DIAGNOSIS — L03116 Cellulitis of left lower limb: Secondary | ICD-10-CM | POA: Diagnosis not present

## 2024-08-09 DIAGNOSIS — Z992 Dependence on renal dialysis: Secondary | ICD-10-CM | POA: Diagnosis not present

## 2024-08-09 DIAGNOSIS — L02416 Cutaneous abscess of left lower limb: Secondary | ICD-10-CM | POA: Diagnosis not present

## 2024-08-10 DIAGNOSIS — Z992 Dependence on renal dialysis: Secondary | ICD-10-CM | POA: Diagnosis not present

## 2024-08-10 DIAGNOSIS — N186 End stage renal disease: Secondary | ICD-10-CM | POA: Diagnosis not present

## 2024-08-10 NOTE — Telephone Encounter (Signed)
 Requested Prescriptions  Pending Prescriptions Disp Refills   levothyroxine  (SYNTHROID ) 112 MCG tablet [Pharmacy Med Name: Levothyroxine  Sodium 112 MCG Oral Tablet] 90 tablet 0    Sig: TAKE 1 TABLET BY MOUTH BEFORE BREAKFAST     Endocrinology:  Hypothyroid Agents Passed - 08/10/2024 11:51 AM      Passed - TSH in normal range and within 360 days    TSH  Date Value Ref Range Status  03/30/2024 2.067 0.350 - 4.500 uIU/mL Final    Comment:    Performed by a 3rd Generation assay with a functional sensitivity of <=0.01 uIU/mL. Performed at [facility], [address] [place], [place] [address]   03/19/2023 0.712 0.450 - 4.500 uIU/mL Final         Passed - Valid encounter within last 12 months    Recent Outpatient Visits           1 week ago Left leg swelling   [place] [facility] at [facility], Leita DEL, MD   1 month ago Cellulitis of left lower extremity   [place] [facility] at [facility], Leita DEL, MD   1 month ago Cellulitis of left lower extremity   [place] [facility] at [facility], Leita DEL, MD   1 month ago Left leg swelling   [place] [facility] at [facility], Leita DEL, MD   2 months ago ESRD on dialysis [facility])   [facility] at [facility], Leita DEL, MD

## 2024-08-11 DIAGNOSIS — N186 End stage renal disease: Secondary | ICD-10-CM | POA: Diagnosis not present

## 2024-08-11 DIAGNOSIS — Z992 Dependence on renal dialysis: Secondary | ICD-10-CM | POA: Diagnosis not present

## 2024-08-13 DIAGNOSIS — Z992 Dependence on renal dialysis: Secondary | ICD-10-CM | POA: Diagnosis not present

## 2024-08-16 DIAGNOSIS — N186 End stage renal disease: Secondary | ICD-10-CM | POA: Diagnosis not present

## 2024-08-20 DIAGNOSIS — Z992 Dependence on renal dialysis: Secondary | ICD-10-CM | POA: Diagnosis not present

## 2024-08-23 DIAGNOSIS — Z992 Dependence on renal dialysis: Secondary | ICD-10-CM | POA: Diagnosis not present

## 2024-08-25 DIAGNOSIS — Z992 Dependence on renal dialysis: Secondary | ICD-10-CM | POA: Diagnosis not present

## 2024-08-30 DIAGNOSIS — N186 End stage renal disease: Secondary | ICD-10-CM | POA: Diagnosis not present

## 2024-09-03 DIAGNOSIS — Z992 Dependence on renal dialysis: Secondary | ICD-10-CM | POA: Diagnosis not present

## 2024-09-06 ENCOUNTER — Other Ambulatory Visit (INDEPENDENT_AMBULATORY_CARE_PROVIDER_SITE_OTHER): Payer: Self-pay | Admitting: Nurse Practitioner

## 2024-09-06 DIAGNOSIS — R0989 Other specified symptoms and signs involving the circulatory and respiratory systems: Secondary | ICD-10-CM

## 2024-09-06 DIAGNOSIS — N186 End stage renal disease: Secondary | ICD-10-CM | POA: Diagnosis not present

## 2024-09-06 DIAGNOSIS — M79606 Pain in leg, unspecified: Secondary | ICD-10-CM

## 2024-09-06 DIAGNOSIS — Z992 Dependence on renal dialysis: Secondary | ICD-10-CM | POA: Diagnosis not present

## 2024-09-07 ENCOUNTER — Telehealth (INDEPENDENT_AMBULATORY_CARE_PROVIDER_SITE_OTHER): Payer: Self-pay

## 2024-09-07 DIAGNOSIS — J449 Chronic obstructive pulmonary disease, unspecified: Secondary | ICD-10-CM | POA: Diagnosis not present

## 2024-09-07 DIAGNOSIS — N186 End stage renal disease: Secondary | ICD-10-CM | POA: Diagnosis not present

## 2024-09-07 DIAGNOSIS — Z992 Dependence on renal dialysis: Secondary | ICD-10-CM | POA: Diagnosis not present

## 2024-09-07 DIAGNOSIS — I120 Hypertensive chronic kidney disease with stage 5 chronic kidney disease or end stage renal disease: Secondary | ICD-10-CM | POA: Diagnosis not present

## 2024-09-07 DIAGNOSIS — E039 Hypothyroidism, unspecified: Secondary | ICD-10-CM | POA: Diagnosis not present

## 2024-09-07 DIAGNOSIS — Z7982 Long term (current) use of aspirin: Secondary | ICD-10-CM | POA: Diagnosis not present

## 2024-09-07 DIAGNOSIS — D696 Thrombocytopenia, unspecified: Secondary | ICD-10-CM | POA: Diagnosis not present

## 2024-09-07 DIAGNOSIS — K219 Gastro-esophageal reflux disease without esophagitis: Secondary | ICD-10-CM | POA: Diagnosis not present

## 2024-09-07 DIAGNOSIS — N2581 Secondary hyperparathyroidism of renal origin: Secondary | ICD-10-CM | POA: Diagnosis not present

## 2024-09-07 DIAGNOSIS — E785 Hyperlipidemia, unspecified: Secondary | ICD-10-CM | POA: Diagnosis not present

## 2024-09-07 DIAGNOSIS — I739 Peripheral vascular disease, unspecified: Secondary | ICD-10-CM | POA: Diagnosis not present

## 2024-09-07 NOTE — Telephone Encounter (Addendum)
 I attempted to contact the patient with no success. I then attempted to contact the patient's son. A message was left for a return call. I am attempted per the fax to schedule the patient for a fistulagram. Patient's son called back and the patient is scheduled for a 09/09/24 with a 2:30 pm arrival time to the [facility]. Pre-procedure instructions were discussed and will be sent to Mychart.

## 2024-09-08 ENCOUNTER — Ambulatory Visit (INDEPENDENT_AMBULATORY_CARE_PROVIDER_SITE_OTHER): Admitting: Nurse Practitioner

## 2024-09-08 ENCOUNTER — Encounter (INDEPENDENT_AMBULATORY_CARE_PROVIDER_SITE_OTHER)

## 2024-09-09 ENCOUNTER — Ambulatory Visit (INDEPENDENT_AMBULATORY_CARE_PROVIDER_SITE_OTHER): Admitting: Emergency Medicine

## 2024-09-09 ENCOUNTER — Encounter: Payer: Self-pay | Admitting: Vascular Surgery

## 2024-09-09 ENCOUNTER — Ambulatory Visit
Admission: RE | Admit: 2024-09-09 | Discharge: 2024-09-09 | Disposition: A | Discharge status: Home / Self Care | Attending: Vascular Surgery | Admitting: Vascular Surgery

## 2024-09-09 ENCOUNTER — Other Ambulatory Visit: Payer: Self-pay

## 2024-09-09 ENCOUNTER — Encounter: Admission: RE | Disposition: A | Payer: Self-pay | Source: Home / Self Care | Attending: Vascular Surgery

## 2024-09-09 VITALS — Ht 71.0 in | Wt 208.0 lb

## 2024-09-09 DIAGNOSIS — N186 End stage renal disease: Secondary | ICD-10-CM | POA: Diagnosis not present

## 2024-09-09 DIAGNOSIS — Y832 Surgical operation with anastomosis, bypass or graft as the cause of abnormal reaction of the patient, or of later complication, without mention of misadventure at the time of the procedure: Secondary | ICD-10-CM | POA: Insufficient documentation

## 2024-09-09 DIAGNOSIS — I120 Hypertensive chronic kidney disease with stage 5 chronic kidney disease or end stage renal disease: Secondary | ICD-10-CM | POA: Insufficient documentation

## 2024-09-09 DIAGNOSIS — Z992 Dependence on renal dialysis: Secondary | ICD-10-CM | POA: Insufficient documentation

## 2024-09-09 DIAGNOSIS — T82898A Other specified complication of vascular prosthetic devices, implants and grafts, initial encounter: Secondary | ICD-10-CM | POA: Insufficient documentation

## 2024-09-09 DIAGNOSIS — Z0000 Encounter for general adult medical examination without abnormal findings: Secondary | ICD-10-CM

## 2024-09-09 DIAGNOSIS — F17210 Nicotine dependence, cigarettes, uncomplicated: Secondary | ICD-10-CM | POA: Diagnosis not present

## 2024-09-09 DIAGNOSIS — E1122 Type 2 diabetes mellitus with diabetic chronic kidney disease: Secondary | ICD-10-CM | POA: Diagnosis not present

## 2024-09-09 HISTORY — PX: A/V FISTULAGRAM: CATH118298

## 2024-09-09 LAB — POTASSIUM (ARMC VASCULAR LAB ONLY): Potassium (ARMC vascular lab): 5.8 mmol/L — ABNORMAL HIGH (ref 3.5–5.1)

## 2024-09-09 SURGERY — A/V FISTULAGRAM
Anesthesia: Moderate Sedation | Laterality: Left

## 2024-09-09 MED ORDER — LIDOCAINE-EPINEPHRINE (PF) 1 %-1:200000 IJ SOLN
INTRAMUSCULAR | Status: DC | PRN
Start: 2024-09-09 — End: 2024-09-09
  Administered 2024-09-09: 5 mL via INTRADERMAL

## 2024-09-09 MED ORDER — ONDANSETRON HCL 4 MG/2ML IJ SOLN: 4.0000 mg | Freq: Four times a day (QID) | INTRAMUSCULAR | Status: DC | PRN

## 2024-09-09 MED ORDER — FENTANYL CITRATE (PF) 100 MCG/2ML IJ SOLN
INTRAMUSCULAR | Status: AC
  Filled 2024-09-09: qty 2

## 2024-09-09 MED ORDER — IODIXANOL 320 MG/ML IV SOLN
INTRAVENOUS | Status: DC | PRN
  Administered 2024-09-09: 15 mL via INTRAVENOUS

## 2024-09-09 MED ORDER — HYDROMORPHONE HCL 1 MG/ML IJ SOLN: 1.0000 mg | Freq: Once | INTRAMUSCULAR | Status: DC | PRN

## 2024-09-09 MED ORDER — HEPARIN (PORCINE) IN NACL 1000-0.9 UT/500ML-% IV SOLN
INTRAVENOUS | Status: DC | PRN
  Administered 2024-09-09: 500 mL

## 2024-09-09 MED ORDER — METHYLPREDNISOLONE SODIUM SUCC 125 MG IJ SOLR: 125.0000 mg | Freq: Once | INTRAMUSCULAR | Status: DC | PRN

## 2024-09-09 MED ORDER — FAMOTIDINE 20 MG PO TABS: 40.0000 mg | ORAL_TABLET | Freq: Once | ORAL | Status: DC | PRN

## 2024-09-09 MED ORDER — DIPHENHYDRAMINE HCL 50 MG/ML IJ SOLN: 50.0000 mg | Freq: Once | INTRAMUSCULAR | Status: DC | PRN

## 2024-09-09 MED ORDER — MIDAZOLAM HCL 2 MG/ML PO SYRP: 8.0000 mg | ORAL_SOLUTION | Freq: Once | ORAL | Status: DC | PRN

## 2024-09-09 MED ORDER — MIDAZOLAM HCL (PF) 2 MG/2ML IJ SOLN
INTRAMUSCULAR | Status: DC | PRN
Start: 2024-09-09 — End: 2024-09-09
  Administered 2024-09-09: .5 mg via INTRAVENOUS

## 2024-09-09 MED ORDER — CEFAZOLIN SODIUM-DEXTROSE 1-4 GM/50ML-% IV SOLN
1.0000 g | INTRAVENOUS | Status: AC
  Administered 2024-09-09: 1 g via INTRAVENOUS

## 2024-09-09 MED ORDER — CEFAZOLIN SODIUM-DEXTROSE 1-4 GM/50ML-% IV SOLN
INTRAVENOUS | Status: AC
  Filled 2024-09-09: qty 50

## 2024-09-09 MED ORDER — FENTANYL CITRATE (PF) 100 MCG/2ML IJ SOLN
INTRAMUSCULAR | Status: DC | PRN
  Administered 2024-09-09: 12.5 ug via INTRAVENOUS
  Administered 2024-09-09: 25 ug via INTRAVENOUS

## 2024-09-09 MED ORDER — HEPARIN SODIUM (PORCINE) 1000 UNIT/ML IJ SOLN
INTRAMUSCULAR | Status: AC
  Filled 2024-09-09: qty 10

## 2024-09-09 MED ORDER — SODIUM CHLORIDE 0.9 % IV SOLN: INTRAVENOUS | Status: DC

## 2024-09-09 MED ORDER — MIDAZOLAM HCL 2 MG/2ML IJ SOLN
INTRAMUSCULAR | Status: AC
  Filled 2024-09-09: qty 2

## 2024-09-09 SURGICAL SUPPLY — 8 items
COVER PROBE ULTRASOUND 5X96 (MISCELLANEOUS) IMPLANT
DRAPE BRACHIAL (DRAPES) IMPLANT
KIT MICROPUNCTURE VSI 5F STIFF (SHEATH) IMPLANT
NDL ENTRY 21GA 7CM ECHOTIP (NEEDLE) IMPLANT
NEEDLE ENTRY 21GA 7CM ECHOTIP (NEEDLE) ×1 IMPLANT
PACK ANGIOGRAPHY (CUSTOM PROCEDURE TRAY) ×1 IMPLANT
SHEATH BRITE TIP 6FRX5.5 (SHEATH) IMPLANT
SUT MNCRL AB 4-0 PS2 18 (SUTURE) IMPLANT

## 2024-09-09 NOTE — Discharge Instructions (Signed)
Fistulagram, Care After Refer to this sheet in the next few weeks. These instructions provide you with information on caring for yourself after your procedure. Your health care provider may also give you more specific instructions. Your treatment has been planned according to current medical practices, but problems sometimes occur. Call your health care provider if you have any problems or questions after your procedure. What can I expect after the procedure? After your procedure, it is typical to have the following:  A small amount of discomfort in the area where the catheters were placed.  A small amount of bruising around the fistula.  Sleepiness and fatigue.  Follow these instructions at home:  Rest at home for the day following your procedure.  Do not drive or operate heavy machinery while taking pain medicine.  Take medicines only as directed by your health care provider.  Do not take baths, swim, or use a hot tub until your health care provider approves. You may shower 24 hours after the procedure or as directed by your health care provider.  There are many different ways to close and cover an incision, including stitches, skin glue, and adhesive strips. Follow your health care provider's instructions on: ? Incision care. ? Bandage (dressing) changes and removal. ? Incision closure removal.  Monitor your dialysis fistula carefully. Contact a health care provider if:  You have drainage, redness, swelling, or pain at your catheter site.  You have a fever.  You have chills. Get help right away if:  You feel weak.  You have trouble balancing.  You have trouble moving your arms or legs.  You have problems with your speech or vision.  You can no longer feel a vibration or buzz when you put your fingers over your dialysis fistula.  The limb that was used for the procedure: ? Swells. ? Is painful. ? Is cold. ? Is discolored, such as blue or pale white. This  information is not intended to replace advice given to you by your health care provider. Make sure you discuss any questions you have with your health care provider. Document Released: 03/14/2014 Document Revised: 04/04/2016 Document Reviewed: 12/17/2013 Elsevier Interactive Patient Education  2018 [employer] 

## 2024-09-09 NOTE — H&P (Signed)
 [facility] Admission History & Physical  MRN : [id number]  Tyrone Moore. is a 73 y.o. ([birth year]) male who presents with chief complaint of No chief complaint on file. SABRA  History of Present Illness: I am asked to evaluate the patient by the dialysis center. The patient was sent here because they were unable to achieve adequate dialysis this morning. Furthermore the Center states there is very poor thrill and bruit. The patient states there there have been increasing problems with the access, such as pulling clots during dialysis and prolonged bleeding after decannulation. The patient estimates these problems have been going on for several weeks. The patient is unaware of any other change.   Patient denies pain or tenderness overlying the access.  There is no pain with dialysis.  The patient denies hand pain or finger pain consistent with steal syndrome.    There have not been past interventions or declots of this access.  The patient is not chronically hypotensive on dialysis.  Current Facility-Administered Medications  Medication Dose Route Frequency Provider Last Rate Last Admin   0.9 %  sodium chloride  infusion   Intravenous Continuous Brown, Fallon E, NP       ceFAZolin  (ANCEF ) IVPB 1 g/50 mL premix  1 g Intravenous 30 min Pre-Op  Brown, Fallon E, NP       diphenhydrAMINE  (BENADRYL ) injection 50 mg  50 mg Intravenous Once PRN Brown, Fallon E, NP       famotidine  (PEPCID ) tablet 40 mg  40 mg Oral Once PRN Brown, Fallon E, NP       HYDROmorphone  (DILAUDID ) injection 1 mg  1 mg Intravenous Once PRN Brown, Fallon E, NP       methylPREDNISolone  sodium succinate (SOLU-MEDROL ) 125 mg/2 mL injection 125 mg  125 mg Intravenous Once PRN Brown, Fallon E, NP       midazolam  (VERSED ) 2 MG/ML syrup 8 mg  8 mg Oral Once PRN Brown, Fallon E, NP       ondansetron  (ZOFRAN ) injection 4 mg  4 mg Intravenous Q6H PRN Delores Orvin FORBES, NP        Past Medical History:  Diagnosis  Date   Acquired hypothyroidism 03/26/2021   Anemia in chronic kidney disease 08/15/2021   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   Chronic bilateral low back pain without sciatica 03/26/2021   S/p 4 lumbar spine surgeries   Chronic kidney disease (CKD), stage V (HCC)    Chronic obstructive pulmonary disease, unspecified (HCC) 07/11/2021   no inhalers   COVID-19 virus infection 08/31/2021   Cyst of kidney, acquired    Essential hypertension 03/26/2021   GERD (gastroesophageal reflux disease)    Hyperlipidemia    Hyperparathyroidism    Hypertension    Mixed hyperlipidemia 03/26/2021   Pre-diabetes    Proteinuria    Thyroid  disease    Tobacco use disorder     Past Surgical History:  Procedure Laterality Date   A/V FISTULAGRAM N/A 06/14/2024   Procedure: A/V Fistulagram;  Surgeon: Marea Selinda RAMAN, MD;  Location: [facility];  Service: Cardiovascular;  Laterality: N/A;   AV FISTULA PLACEMENT Left 05/01/2023   Procedure: ARTERIOVENOUS (AV) FISTULA CREATION ( RADIOCEPHALIC);  Surgeon: Marea Selinda RAMAN, MD;  Location: [facility] OR[facility];  Service: Vascular;  Laterality: Left;   CYSTOSCOPY W/ RETROGRADES Bilateral 07/03/2021   Procedure: CYSTOSCOPY WITH RETROGRADE PYELOGRAM;  Surgeon: Penne Knee, MD;  Location: [facility];  Service:  Urology;  Laterality: Bilateral;   CYSTOSCOPY WITH LITHOLAPAXY N/A 07/03/2021   Procedure: CYSTOSCOPY WITH LITHOLAPAXY;  Surgeon: Penne Knee, MD;  Location: [facility];  Service: Urology;  Laterality: N/A;   HOLEP-LASER ENUCLEATION OF THE PROSTATE WITH MORCELLATION N/A 07/03/2021   Procedure: HOLEP-LASER ENUCLEATION OF THE PROSTATE WITH MORCELLATION;  Surgeon: Penne Knee, MD;  Location: [facility];  Service: Urology;  Laterality: N/A;   SPINE SURGERY  2015   X 4 surgery - lumbar disc    Social History   Tobacco Use   Smoking status: Heavy Smoker    Current packs/day: 1.00    Average packs/day: 1  pack/day for 56.8 years (56.8 ttl pk-yrs)    Types: Cigarettes    Start date: 1969    Passive exposure: Current   Smokeless tobacco: Never  Vaping Use   Vaping status: Never Used  Substance Use Topics   Alcohol use: Not Currently    Comment: quit drinking at 19   Drug use: Never    Family History  Problem Relation Age of Onset   Hypertension Mother    Heart disease Mother    Heart disease Sister    Hypertension Sister    Prostate cancer Neg Hx    Bladder Cancer Neg Hx    Kidney cancer Neg Hx     No family history of bleeding or clotting disorders, autoimmune disease or porphyria  No Known Allergies   REVIEW OF SYSTEMS (Negative unless checked)  Constitutional: [] Weight loss  [] Fever  [] Chills Cardiac: [] Chest pain   [] Chest pressure   [] Palpitations   [] Shortness of breath when laying flat   [] Shortness of breath at rest   [x] Shortness of breath with exertion. Vascular:  [] Pain in legs with walking   [] Pain in legs at rest   [] Pain in legs when laying flat   [] Claudication   [] Pain in feet when walking  [] Pain in feet at rest  [] Pain in feet when laying flat   [] History of DVT   [] Phlebitis   [] Swelling in legs   [] Varicose veins   [] Non-healing ulcers Pulmonary:   [] Uses home oxygen   [] Productive cough   [] Hemoptysis   [] Wheeze  [] COPD   [] Asthma Neurologic:  [] Dizziness  [] Blackouts   [] Seizures   [] History of stroke   [] History of TIA  [] Aphasia   [] Temporary blindness   [] Dysphagia   [] Weakness or numbness in arms   [] Weakness or numbness in legs Musculoskeletal:  [x] Arthritis   [] Joint swelling   [] Joint pain   [] Low back pain Hematologic:  [] Easy bruising  [] Easy bleeding   [] Hypercoagulable state   [x] Anemic  [] Hepatitis Gastrointestinal:  [] Blood in stool   [] Vomiting blood  [] Gastroesophageal reflux/heartburn   [] Difficulty swallowing. Genitourinary:  [x] Chronic kidney disease   [] Difficult urination  [] Frequent urination  [] Burning with urination   [] Blood in  urine Skin:  [] Rashes   [] Ulcers   [] Wounds Psychological:  [] History of anxiety   []  History of major depression.  Physical Examination  There were no vitals filed for this visit. There is no height or weight on file to calculate BMI. Gen: WD/WN, NAD Head: [place]/AT, No temporalis wasting.  Ear/Nose/Throat: Hearing grossly intact, nares w/o erythema or drainage, oropharynx w/o Erythema/Exudate,  Eyes: Conjunctiva clear, sclera non-icteric Neck: Trachea midline.  No JVD.  Pulmonary:  Good air movement, respirations not labored, no use of accessory muscles.  Cardiac: RRR, normal S1, S2. Vascular: AVF on left arm a little pulsatile Vessel Right Left  Radial  Palpable Palpable   Musculoskeletal: M/S 5/5 throughout.  Extremities without ischemic changes.  No deformity or atrophy.  Neurologic: Sensation grossly intact in extremities.  Symmetrical.  Speech is fluent. Motor exam as listed above. Psychiatric: Judgment intact, Mood & affect appropriate for pt's clinical situation. Dermatologic: No rashes or ulcers noted.  No cellulitis or open wounds.    CBC Lab Results  Component Value Date   WBC 5.7 07/01/2024   HGB 10.3 (L) 07/01/2024   HCT 31.5 (L) 07/01/2024   MCV 101.9 (H) 07/01/2024   PLT 134 (L) 07/01/2024    BMET    Component Value Date/Time   NA 143 07/01/2024 1425   NA 141 02/26/2023 0000   K 4.8 07/01/2024 1425   CL 107 07/01/2024 1425   CO2 23 07/01/2024 1425   GLUCOSE 115 (H) 07/01/2024 1425   BUN 39 (H) 07/01/2024 1425   BUN 46 (A) 02/26/2023 0000   CREATININE 5.05 (H) 07/01/2024 1425   CALCIUM  9.4 07/01/2024 1425   GFRNONAA 11 (L) 07/01/2024 1425   CrCl cannot be calculated (Patient's most recent lab result is older than the maximum 21 days allowed.).  COAG Lab Results  Component Value Date   INR 1.0 06/24/2024   INR 1.1 03/30/2024   INR 1.0 08/31/2021    Radiology No results found.  Assessment/Plan 1.  Complication dialysis device with dysfunction  of AV access:  Patient's dialysis access is malfunctioning. The patient will undergo angiography and correction of any problems using interventional techniques with the hope of restoring function to the access.  The risks and benefits were described to the patient.  All questions were answered.  The patient agrees to proceed with angiography and intervention. Potassium will be drawn to ensure that it is an appropriate level prior to performing intervention. 2.  End-stage renal disease requiring hemodialysis:  Patient will continue dialysis therapy without further interruption if a successful intervention is not achieved then a tunneled catheter will be placed. Dialysis has already been arranged. 3.  Hypertension:  Patient will continue medical management; nephrology is following no changes in oral medications. 4. Diabetes mellitus:  Glucose will be monitored and oral medications been held this morning once the patient has undergone the patient's procedure po intake will be reinitiated and again Accu-Cheks will be used to assess the blood glucose level and treat as needed. The patient will be restarted on the patient's usual hypoglycemic regime     Selinda Gu, MD  09/09/2024 1:42 PM

## 2024-09-09 NOTE — Op Note (Signed)
 [facility]    OPERATIVE NOTE   PROCEDURE: 1.   Left radiocephalic arteriovenous fistula cannulation under ultrasound guidance 2.   Left arm fistulagram including central venogram   PRE-OPERATIVE DIAGNOSIS: 1. ESRD 2. Poorly functional left radiocephalic AVF  POST-OPERATIVE DIAGNOSIS: same as above   SURGEON: Selinda Gu, MD  ANESTHESIA: local with MCS  ESTIMATED BLOOD LOSS: 2 cc  FINDING(S): Left radiocephalic AV fistula is widely patent without any focal stenosis in the forearm.  In the upper arm there is duel outflow through both the basilic and the cephalic veins.  The central venous circulation is widely patent.  No obvious abnormalities noted in the left radiocephalic AV fistula or outflow.  SPECIMEN(S):  None  CONTRAST: 15 cc  FLUORO TIME: 0.7 minutes  MODERATE CONSCIOUS SEDATION TIME: Approximately 15 minutes with 0.5 mg of Versed  and 37.5 mcg of Fentanyl    INDICATIONS: Tyrone Moore. is a 73 y.o. male who presents with malfunctioning left radiocephalic arteriovenous fistula.  He is having significant swelling with dialysis.  The patient is scheduled for left arm fistulagram.  The patient is aware the risks include but are not limited to: bleeding, infection, thrombosis of the cannulated access, and possible anaphylactic reaction to the contrast.  The patient is aware of the risks of the procedure and elects to proceed forward.  DESCRIPTION: After full informed written consent was obtained, the patient was brought back to the angiography suite and placed supine upon the angiography table.  The patient was connected to monitoring equipment. Moderate conscious sedation was administered with a face to face encounter with the patient throughout the procedure with my supervision of the RN administering medicines and monitoring the patient's vital signs and mental status throughout from the start of the procedure until the patient was taken to the  recovery room. The left arm was prepped and draped in the standard fashion for a percutaneous access intervention.  Under ultrasound guidance, the left radiocephalic arteriovenous fistula was cannulated with a micropuncture needle under direct ultrasound guidance where it was patent and a permanent image was performed.  The microwire was advanced into the fistula and the needle was exchanged for the a microsheath.  I then upsized to a 6 Fr Sheath and imaging was performed.  Hand injections were completed to image the access including the central venous system. This demonstrated that the left radiocephalic AV fistula is widely patent without any focal stenosis in the forearm.  In the upper arm there is duel outflow through both the basilic and the cephalic veins.  The central venous circulation is widely patent.  No obvious abnormalities noted in the left radiocephalic AV fistula or outflow.   Based on the completion imaging, no further intervention is necessary.  The wire and balloon were removed from the sheath.  A 4-0 Monocryl purse-string suture was sewn around the sheath.  The sheath was removed while tying down the suture.  A sterile bandage was applied to the puncture site.  COMPLICATIONS: None  CONDITION: Stable   Selinda Gu  09/09/2024 3:33 PM   This note was created with Dragon Medical transcription system. Any errors in dictation are purely unintentional.

## 2024-09-09 NOTE — Progress Notes (Signed)
 Subjective:   Tyrone Moore. is a 73 y.o. who presents for a Medicare Wellness preventive visit.  As a reminder, Annual Wellness Visits don't include a physical exam, and some assessments may be limited, especially if this visit is performed virtually. We may recommend an in-person follow-up visit with your provider if needed.  Visit Complete: Virtual I connected with  Tyrone Moore. on 09/09/24 by a audio enabled telemedicine application and verified that I am speaking with the correct person using two identifiers.  Patient Location: Home  Provider Location: Home Office  I discussed the limitations of evaluation and management by telemedicine. The patient expressed understanding and agreed to proceed.  Vital Signs: Because this visit was a virtual/telehealth visit, some criteria may be missing or patient reported. Any vitals not documented were not able to be obtained and vitals that have been documented are patient reported.  VideoDeclined- This patient declined Librarian, academic. Therefore the visit was completed with audio only.  Persons Participating in Visit: Patient.  AWV Questionnaire: No: Patient Medicare AWV questionnaire was not completed prior to this visit.  Cardiac Risk Factors include: advanced age (>78men, >31 women);male gender;dyslipidemia;hypertension;smoking/ tobacco exposure;Other (see comment), Risk factor comments: prediabetic     Objective:    Today's Vitals   09/09/24 0913  Weight: 208 lb (94.3 kg)  Height: 5' 11 (1.803 m)   Body mass index is 29.01 kg/m.     09/09/2024    9:23 AM 06/29/2024   10:06 PM 06/29/2024    4:28 PM 06/24/2024   10:45 AM 06/14/2024    1:25 PM 03/31/2024    9:38 AM 03/30/2024    6:14 PM  Advanced Directives  Does Patient Have a Medical Advance Directive? Yes  No No No No No  Type of Estate Agent of [place];Living will        Does patient want to make changes to  medical advance directive? No - Patient declined        Copy of Healthcare Power of Attorney in Chart? No - copy requested        Would patient like information on creating a medical advance directive?  No - Patient declined;No - Guardian declined  No - Patient declined No - Patient declined No - Patient declined     Current Medications (verified) Outpatient Encounter Medications as of 09/09/2024  Medication Sig   acetaminophen  (TYLENOL ) 500 MG tablet Take 1,000 mg by mouth every 6 (six) hours as needed for moderate pain.   albuterol  (VENTOLIN  HFA) 108 (90 Base) MCG/ACT inhaler Inhale 2 puffs into the lungs every 6 (six) hours as needed for wheezing or shortness of breath.   amLODipine  (NORVASC ) 2.5 MG tablet Take 2.5 mg by mouth at bedtime.   Aspirin -Calcium  Carbonate 81-777 MG TABS Take 81 mg by mouth daily.   carvedilol  (COREG ) 25 MG tablet Take 1 tablet (25 mg total) by mouth 2 (two) times daily with a meal.   Cholecalciferol  (D3-1000 PO) Take 2,000 Units by mouth at bedtime.   ferrous sulfate  325 (65 FE) MG tablet Take 325 mg by mouth at bedtime.   fluticasone -salmeterol (WIXELA INHUB ) 250-50 MCG/ACT AEPB Inhale 1 puff into the lungs 2 (two) times daily.   levothyroxine  (SYNTHROID ) 112 MCG tablet TAKE 1 TABLET BY MOUTH BEFORE BREAKFAST   lidocaine -prilocaine  (EMLA ) cream Apply 1 Application topically as directed.   lisinopril (ZESTRIL) 10 MG tablet Take 10 mg by mouth daily.   magnesium  gluconate (  MAGONATE) 500 MG tablet Take 500 mg by mouth.   Multiple Vitamins-Minerals (MULTIVITAL-M PO) Take 1 tablet by mouth at bedtime.   omeprazole  (PRILOSEC) 20 MG capsule Take 1 capsule by mouth once daily   ondansetron  (ZOFRAN -ODT) 4 MG disintegrating tablet Take 4 mg by mouth every 6 (six) hours as needed.   rosuvastatin  (CRESTOR ) 20 MG tablet Take 1 tablet (20 mg total) by mouth at bedtime.   No facility-administered encounter medications on file as of 09/09/2024.    Allergies  (verified) Patient has no known allergies.   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   Chronic bilateral low back pain without sciatica 03/26/2021   S/p 4 lumbar spine surgeries   Chronic kidney disease (CKD), stage V (HCC)    Chronic obstructive pulmonary disease, unspecified (HCC) 07/11/2021   no inhalers   COVID-19 virus infection 08/31/2021   Cyst of kidney, acquired    Essential hypertension 03/26/2021   GERD (gastroesophageal reflux disease)    Hyperlipidemia    Hyperparathyroidism    Hypertension    Mixed hyperlipidemia 03/26/2021   Pre-diabetes    Proteinuria    Thyroid  disease    Tobacco use disorder    Past Surgical History:  Procedure Laterality Date   A/V FISTULAGRAM N/A 06/14/2024   Procedure: A/V Fistulagram;  Surgeon: Marea Selinda RAMAN, MD;  Location: [facility];  Service: Cardiovascular;  Laterality: N/A;   AV FISTULA PLACEMENT Left 05/01/2023   Procedure: ARTERIOVENOUS (AV) FISTULA CREATION ( RADIOCEPHALIC);  Surgeon: Marea Selinda RAMAN, MD;  Location: [facility];  Service: Vascular;  Laterality: Left;   CYSTOSCOPY W/ RETROGRADES Bilateral 07/03/2021   Procedure: CYSTOSCOPY WITH RETROGRADE PYELOGRAM;  Surgeon: Penne Knee, MD;  Location: [facility];  Service: Urology;  Laterality: Bilateral;   CYSTOSCOPY WITH LITHOLAPAXY N/A 07/03/2021   Procedure: CYSTOSCOPY WITH LITHOLAPAXY;  Surgeon: Penne Knee, MD;  Location: [facility];  Service: Urology;  Laterality: N/A;   HOLEP-LASER ENUCLEATION OF THE PROSTATE WITH MORCELLATION N/A 07/03/2021   Procedure: HOLEP-LASER ENUCLEATION OF THE PROSTATE WITH MORCELLATION;  Surgeon: Penne Knee, MD;  Location: [facility];  Service: Urology;  Laterality: N/A;   SPINE SURGERY  2015   X 4 surgery - lumbar disc   Family History  Problem  Relation Age of Onset   Hypertension Mother    Heart disease Mother    Heart disease Sister    Hypertension Sister    Prostate cancer Neg Hx    Bladder Cancer Neg Hx    Kidney cancer Neg Hx    Social History   Socioeconomic History   Marital status: Widowed    Spouse name: Not on file   Number of children: 2   Years of education: Not on file   Highest education level: Not on file  Occupational History   Occupation: retired  Tobacco Use   Smoking status: Heavy Smoker    Current packs/day: 1.00    Average packs/day: 1 pack/day for 56.8 years (56.8 ttl pk-yrs)    Types: Cigarettes    Start date: 1969    Passive exposure: Current   Smokeless tobacco: Never  Vaping Use   Vaping status: Never Used  Substance and Sexual Activity   Alcohol use: Not Currently    Comment: quit drinking at 19   Drug use: Never   Sexual activity:  Not Currently    Partners: Female    Birth control/protection: None  Other Topics Concern   Not on file  Social History Narrative   Pt lives alone   Social Drivers of Health   Financial Resource Strain: Low Risk  (09/09/2024)   Overall Financial Resource Strain (CARDIA)    Difficulty of Paying Living Expenses: Not hard at all  Food Insecurity: No Food Insecurity (09/09/2024)   Hunger Vital Sign    Worried About Running Out of Food in the Last Year: Never true    Ran Out of Food in the Last Year: Never true  Transportation Needs: No Transportation Needs (09/09/2024)   PRAPARE - Administrator, Civil Service (Medical): No    Lack of Transportation (Non-Medical): No  Physical Activity: Sufficiently Active (09/09/2024)   Exercise Vital Sign    Days of Exercise per Week: 2 days    Minutes of Exercise per Session: 90 min  Stress: No Stress Concern Present (09/09/2024)   [employer] of Occupational Health - Occupational Stress Questionnaire    Feeling of Stress: Not at all  Social Connections: Moderately Isolated (09/09/2024)    Social Connection and Isolation Panel    Frequency of Communication with Friends and Family: More than three times a week    Frequency of Social Gatherings with Friends and Family: More than three times a week    Attends Religious Services: Never    Database Administrator or Organizations: Yes    Attends Engineer, Structural: More than 4 times per year    Marital Status: Widowed    Tobacco Counseling Ready to quit: Not Answered Counseling given: Not Answered    Clinical Intake:  Pre-visit preparation completed: Yes  Pain : No/denies pain     BMI - recorded: 29.01 Nutritional Status: BMI 25 -29 Overweight Nutritional Risks: None Diabetes: No  Lab Results  Component Value Date   HGBA1C 5.8 (H) 03/19/2023   HGBA1C 5.7 (A) 09/17/2022   HGBA1C 6.0 (H) 08/31/2021     How often do you need to have someone help you when you read instructions, pamphlets, or other written materials from your doctor or pharmacy?: 1 - Never  Interpreter Needed?: No  Information entered by :: Vina Ned, CMA   Activities of Daily Living     09/09/2024    9:14 AM 06/29/2024   10:12 PM  In your present state of health, do you have any difficulty performing the following activities:  Hearing? 1 1  Vision? 0 0  Difficulty concentrating or making decisions? 0 0  Walking or climbing stairs? 0   Dressing or bathing? 0   Doing errands, shopping? 0   Preparing Food and eating ? N   Using the Toilet? N   In the past six months, have you accidently leaked urine? N   Do you have problems with loss of bowel control? N   Managing your Medications? N   Managing your Finances? N   Housekeeping or managing your Housekeeping? N     Patient Care Team: [name], MD as PCP - General (Internal Medicine) [place], [facility]) Neill Boas, [place] (Podiatry) Marea, Selinda RAMAN, MD as Referring Physician (Vascular Surgery) Dialysis, [facility], [place], MD  (Nephrology)  I have updated your Care Teams any recent Medical Services you may have received from other providers in the past year.     Assessment:   This is a routine wellness examination for  Jessie.  Hearing/Vision screen Hearing Screening - Comments:: Some difficulty hearing Vision Screening - Comments:: UTD @  [facility]   Goals Addressed             This Visit's Progress    Patient Stated       Be more social       Depression Screen     09/09/2024    9:21 AM 08/03/2024    3:47 PM 07/05/2024    1:47 PM 06/29/2024    1:34 PM 06/23/2024    8:48 AM 05/19/2024    2:39 PM 11/19/2023    3:05 PM  PHQ 2/9 Scores  PHQ - 2 Score 0 0 0 2 2 1 1   PHQ- 9 Score 1 0 0 8 12 3 6     Fall Risk     09/09/2024    9:26 AM 08/03/2024    3:47 PM 07/05/2024    1:47 PM 06/29/2024    1:34 PM 06/23/2024    8:47 AM  Fall Risk   Falls in the past year? 0 0 0 0 0  Number falls in past yr: 0 0 0 0 0  Injury with Fall? 0 0 0 0 0  Risk for fall due to : No Fall Risks No Fall Risks No Fall Risks No Fall Risks No Fall Risks  Follow up Falls evaluation completed Falls evaluation completed Falls evaluation completed Falls evaluation completed Falls evaluation completed    MEDICARE RISK AT HOME:  Medicare Risk at Home Any stairs in or around the home?: Yes If so, are there any without handrails?: No Home free of loose throw rugs in walkways, pet beds, electrical cords, etc?: Yes Adequate lighting in your home to reduce risk of falls?: Yes Life alert?: No Use of a cane, walker or w/c?: No Grab bars in the bathroom?: No Shower chair or bench in shower?: Yes Elevated toilet seat or a handicapped toilet?: Yes  TIMED UP AND GO:  Was the test performed?  No  Cognitive Function: 6CIT completed        09/09/2024    9:27 AM 08/27/2023    9:45 AM 08/21/2022   10:16 AM  6CIT Screen  What Year? 0 points 0 points 0 points  What month? 0 points 0 points 0 points  What time? 3 points 0  points 0 points  Count back from 20 0 points 0 points 0 points  Months in reverse 4 points 4 points 0 points  Repeat phrase 0 points 0 points 2 points  Total Score 7 points 4 points 2 points    Immunizations Immunization History  Administered Date(s) Administered   Hepatitis B, ADULT 03/25/2024   Influenza-Unspecified 12/23/2023   PNEUMOCOCCAL CONJUGATE-20 02/03/2024    Screening Tests Health Maintenance  Topic Date Due   Colonoscopy  Never done   Zoster Vaccines- Shingrix (1 of 2) Never done   COVID-19 Vaccine (1 - 2025-26 season) Never done   Influenza Vaccine  02/08/2025 (Originally 06/11/2024)   Medicare Annual Wellness (AWV)  09/09/2025   Pneumococcal Vaccine: 50+ Years  Completed   Hepatitis C Screening  Completed   Meningococcal B Vaccine  Aged Out   Lung Cancer Screening  Discontinued   DTaP/Tdap/Td  Discontinued   Hepatitis B Vaccines 19-59 Average Risk  Discontinued    Health Maintenance Items Addressed: See Nurse Notes at the end of this note  Additional Screening:  Vision Screening: Recommended annual ophthalmology exams for early detection of glaucoma and  other disorders of the eye. Is the patient up to date with their annual eye exam?  Yes  Who is the provider or what is the name of the office in which the patient attends annual eye exams? [facility]   Dental Screening: Recommended annual dental exams for proper oral hygiene  Community Resource Referral / Chronic Care Management: CRR required this visit?  No   CCM required this visit?  No   Plan:    I have personally reviewed and noted the following in the patient's chart:   Medical and social history Use of alcohol, tobacco or illicit drugs  Current medications and supplements including opioid prescriptions. Patient is not currently taking opioid prescriptions. Functional ability and status Nutritional status Physical activity Advanced directives List of other  physicians Hospitalizations, surgeries, and ER visits in previous 12 months Vitals Screenings to include cognitive, depression, and falls Referrals and appointments  In addition, I have reviewed and discussed with patient certain preventive protocols, quality metrics, and best practice recommendations. A written personalized care plan for preventive services as well as general preventive health recommendations were provided to patient.   Vina Ned, CMA   09/09/2024   After Visit Summary: (MyChart) Due to this being a telephonic visit, the after visit summary with patients personalized plan was offered to patient via MyChart   Notes:  6 CIT Score - 7 Declined flu, Hep B, shingles and covid vaccines Declined colonoscopy and cologuard Declined lung cancer screening

## 2024-09-09 NOTE — Patient Instructions (Signed)
 Mr. Degante,  Thank you for taking the time for your Medicare Wellness Visit. I appreciate your continued commitment to your health goals. Please review the care plan we discussed, and feel free to reach out if I can assist you further.  Medicare recommends these wellness visits once per year to help you and your care team stay ahead of potential health issues. These visits are designed to focus on prevention, allowing your provider to concentrate on managing your acute and chronic conditions during your regular appointments.  Please note that Annual Wellness Visits do not include a physical exam. Some assessments may be limited, especially if the visit was conducted virtually. If needed, we may recommend a separate in-person follow-up with your provider.  Ongoing Care Seeing your primary care provider every 3 to 6 months helps us  monitor your health and provide consistent, personalized care.   Referrals If a referral was made during today's visit and you haven't received any updates within two weeks, please contact the referred provider directly to check on the status.  Recommended Screenings:  Health Maintenance  Topic Date Due   Colon Cancer Screening  Never done   Zoster (Shingles) Vaccine (1 of 2) Never done   COVID-19 Vaccine (1 - 2025-26 season) Never done   Flu Shot  02/08/2025*   Medicare Annual Wellness Visit  09/09/2025   Pneumococcal Vaccine for age over 78  Completed   Hepatitis C Screening  Completed   Meningitis B Vaccine  Aged Out   Screening for Lung Cancer  Discontinued   DTaP/Tdap/Td vaccine  Discontinued   Hepatitis B Vaccine  Discontinued  *Topic was postponed. The date shown is not the original due date.       09/09/2024    9:23 AM  Advanced Directives  Does Patient Have a Medical Advance Directive? Yes  Type of Estate Agent of [place];Living will  Does patient want to make changes to medical advance directive? No - Patient declined   Copy of Healthcare Power of Attorney in Chart? No - copy requested   Advance Care Planning is important because it: Ensures you receive medical care that aligns with your values, goals, and preferences. Provides guidance to your family and loved ones, reducing the emotional burden of decision-making during critical moments.  Vision: Annual vision screenings are recommended for early detection of glaucoma, cataracts, and diabetic retinopathy. These exams can also reveal signs of chronic conditions such as diabetes and high blood pressure.  Dental: Annual dental screenings help detect early signs of oral cancer, gum disease, and other conditions linked to overall health, including heart disease and diabetes.  Please see the attached documents for additional preventive care recommendations.   Fall Prevention in the Home, Adult Falls can cause injuries and affect people of all ages. There are many simple things that you can do to make your home safe and to help prevent falls. If you need it, ask for help making these changes. What actions can I take to prevent falls? General information Use good lighting in all rooms. Make sure to: Replace any light bulbs that burn out. Turn on lights if it is dark and use night-lights. Keep items that you use often in easy-to-reach places. Lower the shelves around your home if needed. Move furniture so that there are clear paths around it. Do not keep throw rugs or other things on the floor that can make you trip. If any of your floors are uneven, fix them. Add color or contrast  paint or tape to clearly mark and help you see: Grab bars or handrails. First and last steps of staircases. Where the edge of each step is. If you use a ladder or stepladder: Make sure that it is fully opened. Do not climb a closed ladder. Make sure the sides of the ladder are locked in place. Have someone hold the ladder while you use it. Know where your pets are as you move  through your home. What can I do in the bathroom?     Keep the floor dry. Clean up any water that is on the floor right away. Remove soap buildup in the bathtub or shower. Buildup makes bathtubs and showers slippery. Use non-skid mats or decals on the floor of the bathtub or shower. Attach bath mats securely with double-sided, non-slip rug tape. If you need to sit down while you are in the shower, use a non-slip stool. Install grab bars by the toilet and in the bathtub and shower. Do not use towel bars as grab bars. What can I do in the bedroom? Make sure that you have a light by your bed that is easy to reach. Do not use any sheets or blankets on your bed that hang to the floor. Have a firm bench or chair with side arms that you can use for support when you get dressed. What can I do in the kitchen? Clean up any spills right away. If you need to reach something above you, use a sturdy step stool that has a grab bar. Keep electrical cables out of the way. Do not use floor polish or wax that makes floors slippery. What can I do with my stairs? Do not leave anything on the stairs. Make sure that you have a light switch at the top and the bottom of the stairs. Have them installed if you do not have them. Make sure that there are handrails on both sides of the stairs. Fix handrails that are broken or loose. Make sure that handrails are as long as the staircases. Install non-slip stair treads on all stairs in your home if they do not have carpet. Avoid having throw rugs at the top or bottom of stairs, or secure the rugs with carpet tape to prevent them from moving. Choose a carpet design that does not hide the edge of steps on the stairs. Make sure that carpet is firmly attached to the stairs. Fix any carpet that is loose or worn. What can I do on the outside of my home? Use bright outdoor lighting. Repair the edges of walkways and driveways and fix any cracks. Clear paths of anything that  can make you trip, such as tools or rocks. Add color or contrast paint or tape to clearly mark and help you see high doorway thresholds. Trim any bushes or trees on the main path into your home. Check that handrails are securely fastened and in good repair. Both sides of all steps should have handrails. Install guardrails along the edges of any raised decks or porches. Have leaves, snow, and ice cleared regularly. Use sand, salt, or ice melt on walkways during winter months if you live where there is ice and snow. In the garage, clean up any spills right away, including grease or oil spills. What other actions can I take? Review your medicines with your health care provider. Some medicines can make you confused or feel dizzy. This can increase your chance of falling. Wear closed-toe shoes that fit well and support  your feet. Wear shoes that have rubber soles and low heels. Use a cane, walker, scooter, or crutches that help you move around if needed. Talk with your provider about other ways that you can decrease your risk of falls. This may include seeing a physical therapist to learn to do exercises to improve movement and strength. Where to find more information Centers for Disease Control and Prevention, [employer]: tonerpromos.no [employer] on Aging: baseringtones.pl National Institute on Aging: baseringtones.pl Contact a health care provider if: You are afraid of falling at home. You feel weak, drowsy, or dizzy at home. You fall at home. Get help right away if you: Lose consciousness or have trouble moving after a fall. Have a fall that causes a head injury. These symptoms may be an emergency. Get help right away. Call 911. Do not wait to see if the symptoms will go away. Do not drive yourself to the hospital. This information is not intended to replace advice given to you by your health care provider. Make sure you discuss any questions you have with your health care provider. Document Revised:  07/01/2022 Document Reviewed: 07/01/2022 Elsevier Patient Education  2024 [employer].

## 2024-09-10 DIAGNOSIS — Z992 Dependence on renal dialysis: Secondary | ICD-10-CM | POA: Diagnosis not present

## 2024-09-13 ENCOUNTER — Encounter: Payer: Self-pay | Admitting: Vascular Surgery

## 2024-09-15 DIAGNOSIS — N186 End stage renal disease: Secondary | ICD-10-CM | POA: Diagnosis not present

## 2024-09-15 DIAGNOSIS — Z992 Dependence on renal dialysis: Secondary | ICD-10-CM | POA: Diagnosis not present

## 2024-09-17 DIAGNOSIS — N186 End stage renal disease: Secondary | ICD-10-CM | POA: Diagnosis not present

## 2024-09-17 DIAGNOSIS — Z992 Dependence on renal dialysis: Secondary | ICD-10-CM | POA: Diagnosis not present

## 2024-09-20 DIAGNOSIS — Z992 Dependence on renal dialysis: Secondary | ICD-10-CM | POA: Diagnosis not present

## 2024-09-22 DIAGNOSIS — Z992 Dependence on renal dialysis: Secondary | ICD-10-CM | POA: Diagnosis not present

## 2024-09-24 DIAGNOSIS — Z992 Dependence on renal dialysis: Secondary | ICD-10-CM | POA: Diagnosis not present

## 2024-09-26 ENCOUNTER — Other Ambulatory Visit: Payer: Self-pay | Admitting: Internal Medicine

## 2024-09-26 DIAGNOSIS — K219 Gastro-esophageal reflux disease without esophagitis: Secondary | ICD-10-CM

## 2024-09-27 DIAGNOSIS — Z992 Dependence on renal dialysis: Secondary | ICD-10-CM | POA: Diagnosis not present

## 2024-09-27 DIAGNOSIS — N186 End stage renal disease: Secondary | ICD-10-CM | POA: Diagnosis not present

## 2024-09-28 NOTE — Telephone Encounter (Signed)
 Requested Prescriptions  Pending Prescriptions Disp Refills   omeprazole  (PRILOSEC) 20 MG capsule [Pharmacy Med Name: Omeprazole  20 MG Oral Capsule Delayed Release] 90 capsule 0    Sig: Take 1 capsule by mouth once daily     Gastroenterology: Proton Pump Inhibitors Passed - 09/28/2024  1:24 PM      Passed - Valid encounter within last 12 months    Recent Outpatient Visits           1 month ago Left leg swelling   [place] [facility] at [facility], Leita DEL, MD   2 months ago Cellulitis of left lower extremity   [place] [facility] at [facility], Leita DEL, MD   3 months ago Cellulitis of left lower extremity   [place] [facility] at [facility], Leita DEL, MD   3 months ago Left leg swelling   [place] [facility] at [facility], Leita DEL, MD   4 months ago ESRD on dialysis [facility])   [facility] at [facility], Leita DEL, MD

## 2024-10-01 DIAGNOSIS — Z992 Dependence on renal dialysis: Secondary | ICD-10-CM | POA: Diagnosis not present

## 2024-10-02 ENCOUNTER — Other Ambulatory Visit: Payer: Self-pay | Admitting: Internal Medicine

## 2024-10-02 DIAGNOSIS — E782 Mixed hyperlipidemia: Secondary | ICD-10-CM

## 2024-10-04 DIAGNOSIS — Z992 Dependence on renal dialysis: Secondary | ICD-10-CM | POA: Diagnosis not present

## 2024-10-04 DIAGNOSIS — N186 End stage renal disease: Secondary | ICD-10-CM | POA: Diagnosis not present

## 2024-10-04 NOTE — Telephone Encounter (Signed)
 Requested Prescriptions  Pending Prescriptions Disp Refills   rosuvastatin  (CRESTOR ) 20 MG tablet [Pharmacy Med Name: Rosuvastatin  Calcium  20 MG Oral Tablet] 90 tablet 0    Sig: TAKE 1 TABLET BY MOUTH AT BEDTIME     Cardiovascular:  Antilipid - Statins 2 Failed - 10/04/2024 12:52 PM      Failed - Cr in normal range and within 360 days    Creatinine, Ser  Date Value Ref Range Status  07/01/2024 5.05 (H) 0.61 - 1.24 mg/dL Final         Failed - Lipid Panel in normal range within the last 12 months    Cholesterol, Total  Date Value Ref Range Status  03/19/2023 146 100 - 199 mg/dL Final   Cholesterol  Date Value Ref Range Status  03/31/2024 140 0 - 200 mg/dL Final   LDL Chol Calc (NIH)  Date Value Ref Range Status  03/19/2023 83 0 - 99 mg/dL Final   LDL Cholesterol  Date Value Ref Range Status  03/31/2024 78 0 - 99 mg/dL Final    Comment:           Total Cholesterol/HDL:CHD Risk Coronary Heart Disease Risk Table                     Men   Women  1/2 Average Risk   3.4   3.3  Average Risk       5.0   4.4  2 X Average Risk   9.6   7.1  3 X Average Risk  23.4   11.0        Use the calculated Patient Ratio above and the CHD Risk Table to determine the patient's CHD Risk.        ATP III CLASSIFICATION (LDL):  <100     mg/dL   Optimal  899-870  mg/dL   Near or Above                    Optimal  130-159  mg/dL   Borderline  839-810  mg/dL   High  >809     mg/dL   Very High Performed at [facility], [address] [place], [place] [address]    HDL  Date Value Ref Range Status  03/31/2024 36 (L) >40 mg/dL Final  94/91/7975 32 (L) >39 mg/dL Final   Triglycerides  Date Value Ref Range Status  03/31/2024 129 <150 mg/dL Final         Passed - Patient is not pregnant      Passed - Valid encounter within last 12 months    Recent Outpatient Visits           2 months ago Left leg swelling   [place] [facility] at [facility], Leita DEL, MD   3 months ago Cellulitis of left lower extremity   [place] [facility] at [facility], Leita DEL, MD   3 months ago Cellulitis of left lower extremity   [place] [facility] at [facility], Leita DEL, MD   3 months ago Left leg swelling   [place] [facility] at [facility], Leita DEL, MD   4 months ago ESRD on dialysis [facility])   [facility] at [facility], Leita DEL, MD

## 2024-10-05 ENCOUNTER — Encounter (INDEPENDENT_AMBULATORY_CARE_PROVIDER_SITE_OTHER)

## 2024-10-05 ENCOUNTER — Ambulatory Visit (INDEPENDENT_AMBULATORY_CARE_PROVIDER_SITE_OTHER): Admitting: Vascular Surgery

## 2024-10-06 ENCOUNTER — Other Ambulatory Visit: Payer: Self-pay | Admitting: Internal Medicine

## 2024-10-06 DIAGNOSIS — E782 Mixed hyperlipidemia: Secondary | ICD-10-CM

## 2024-10-10 DIAGNOSIS — N186 End stage renal disease: Secondary | ICD-10-CM | POA: Diagnosis not present

## 2024-10-10 DIAGNOSIS — Z992 Dependence on renal dialysis: Secondary | ICD-10-CM | POA: Diagnosis not present

## 2024-10-11 NOTE — Telephone Encounter (Signed)
 Duplicate request.  Requested Prescriptions  Pending Prescriptions Disp Refills   rosuvastatin  (CRESTOR ) 20 MG tablet [Pharmacy Med Name: Rosuvastatin  Calcium  20 MG Oral Tablet] 90 tablet 0    Sig: TAKE 1 TABLET BY MOUTH AT BEDTIME     Cardiovascular:  Antilipid - Statins 2 Failed - 10/11/2024  3:37 PM      Failed - Cr in normal range and within 360 days    Creatinine, Ser  Date Value Ref Range Status  07/01/2024 5.05 (H) 0.61 - 1.24 mg/dL Final         Failed - Lipid Panel in normal range within the last 12 months    Cholesterol, Total  Date Value Ref Range Status  03/19/2023 146 100 - 199 mg/dL Final   Cholesterol  Date Value Ref Range Status  03/31/2024 140 0 - 200 mg/dL Final   LDL Chol Calc (NIH)  Date Value Ref Range Status  03/19/2023 83 0 - 99 mg/dL Final   LDL Cholesterol  Date Value Ref Range Status  03/31/2024 78 0 - 99 mg/dL Final    Comment:           Total Cholesterol/HDL:CHD Risk Coronary Heart Disease Risk Table                     Men   Women  1/2 Average Risk   3.4   3.3  Average Risk       5.0   4.4  2 X Average Risk   9.6   7.1  3 X Average Risk  23.4   11.0        Use the calculated Patient Ratio above and the CHD Risk Table to determine the patient's CHD Risk.        ATP III CLASSIFICATION (LDL):  <100     mg/dL   Optimal  899-870  mg/dL   Near or Above                    Optimal  130-159  mg/dL   Borderline  839-810  mg/dL   High  >809     mg/dL   Very High Performed at [facility], [address] [place], [place] [address]    HDL  Date Value Ref Range Status  03/31/2024 36 (L) >40 mg/dL Final  94/91/7975 32 (L) >39 mg/dL Final   Triglycerides  Date Value Ref Range Status  03/31/2024 129 <150 mg/dL Final         Passed - Patient is not pregnant      Passed - Valid encounter within last 12 months    Recent Outpatient Visits           2 months ago Left leg swelling   [place] [facility] at  [facility], Leita DEL, MD   3 months ago Cellulitis of left lower extremity   [place] [facility] at [facility], Leita DEL, MD   3 months ago Cellulitis of left lower extremity   [place] [facility] at [facility], Leita DEL, MD   3 months ago Left leg swelling   [place] [facility] at [facility], Leita DEL, MD   4 months ago ESRD on dialysis [facility])   [facility] at [facility], Leita DEL, MD

## 2024-10-22 ENCOUNTER — Encounter: Payer: Self-pay | Admitting: Pulmonary Disease

## 2024-11-19 ENCOUNTER — Other Ambulatory Visit: Payer: Self-pay | Admitting: Student

## 2024-11-19 DIAGNOSIS — E039 Hypothyroidism, unspecified: Secondary | ICD-10-CM

## 2024-11-19 NOTE — Telephone Encounter (Signed)
 Copied from CRM #[id number]. Topic: Clinical - Medication Refill >> Nov 19, 2024 12:08 PM Rosaria BRAVO wrote: Pt states that this was supposed to have been filled during his last visit in November.   Medication: levothyroxine  (SYNTHROID ) 112 MCG tablet  Has the patient contacted their pharmacy? Yes (Agent: If no, request that the patient contact the pharmacy for the refill. If patient does not wish to contact the pharmacy document the reason why and proceed with request.) (Agent: If yes, when and what did the pharmacy advise?)  This is the patient's preferred pharmacy:  [facility] [address] [place] - [address] [place] [address] Phone: [phone] Fax: [phone]  Is this the correct pharmacy for this prescription? Yes If no, delete pharmacy and type the correct one.   Has the prescription been filled recently? Yes  Is the patient out of the medication? No.   Has the patient been seen for an appointment in the last year OR does the patient have an upcoming appointment? Yes  Can we respond through MyChart? Yes  Agent: Please be advised that Rx refills may take up to 3 business days. We ask that you follow-up with your pharmacy.

## 2024-11-22 MED ORDER — LEVOTHYROXINE SODIUM 112 MCG PO TABS: 112.0000 ug | ORAL_TABLET | Freq: Every day | ORAL | 0 refills | Status: DC

## 2024-11-22 NOTE — Telephone Encounter (Signed)
 Requested Prescriptions  Pending Prescriptions Disp Refills   levothyroxine  (SYNTHROID ) 112 MCG tablet 90 tablet 0    Sig: Take 1 tablet (112 mcg total) by mouth daily before breakfast.     Endocrinology:  Hypothyroid Agents Passed - 11/22/2024 11:24 AM      Passed - TSH in normal range and within 360 days    TSH  Date Value Ref Range Status  03/30/2024 2.067 0.350 - 4.500 uIU/mL Final    Comment:    Performed by a 3rd Generation assay with a functional sensitivity of <=0.01 uIU/mL. Performed at [facility], [address] [place], [place] [address]   03/19/2023 0.712 0.450 - 4.500 uIU/mL Final         Passed - Valid encounter within last 12 months    Recent Outpatient Visits           3 months ago Left leg swelling   [place] [facility] at [facility], Leita DEL, MD   4 months ago Cellulitis of left lower extremity   [place] [facility] at [facility], Leita DEL, MD   4 months ago Cellulitis of left lower extremity   [place] [facility] at [facility], Leita DEL, MD   5 months ago Left leg swelling   [place] [facility] at [facility], Leita DEL, MD   6 months ago ESRD on dialysis [facility])   [facility] at [facility], Leita DEL, MD

## 2024-11-23 ENCOUNTER — Encounter: Payer: Self-pay | Admitting: Student in an Organized Health Care Education/Training Program

## 2024-11-23 ENCOUNTER — Ambulatory Visit: Admitting: Student in an Organized Health Care Education/Training Program

## 2024-11-23 VITALS — BP 122/70 | HR 72 | Temp 98.1°F | Ht 71.0 in | Wt 204.3 lb

## 2024-11-23 DIAGNOSIS — J441 Chronic obstructive pulmonary disease with (acute) exacerbation: Secondary | ICD-10-CM

## 2024-11-23 DIAGNOSIS — F17210 Nicotine dependence, cigarettes, uncomplicated: Secondary | ICD-10-CM

## 2024-11-23 DIAGNOSIS — F17200 Nicotine dependence, unspecified, uncomplicated: Secondary | ICD-10-CM

## 2024-11-23 DIAGNOSIS — J449 Chronic obstructive pulmonary disease, unspecified: Secondary | ICD-10-CM

## 2024-11-23 MED ORDER — ALBUTEROL SULFATE HFA 108 (90 BASE) MCG/ACT IN AERS: 2.0000 | INHALATION_SPRAY | RESPIRATORY_TRACT | 0 refills | Status: AC | PRN

## 2024-11-23 MED ORDER — PREDNISONE 50 MG PO TABS: 50.0000 mg | ORAL_TABLET | Freq: Every day | ORAL | 0 refills | Status: AC

## 2024-11-23 MED ORDER — CEFPODOXIME PROXETIL 200 MG PO TABS: 200.0000 mg | ORAL_TABLET | Freq: Two times a day (BID) | ORAL | 0 refills | Status: AC

## 2024-11-23 MED ORDER — TRELEGY ELLIPTA 100-62.5-25 MCG/ACT IN AEPB: 1.0000 | INHALATION_SPRAY | Freq: Every day | RESPIRATORY_TRACT | 12 refills | Status: AC

## 2024-11-23 MED ORDER — AZITHROMYCIN 250 MG PO TABS: ORAL_TABLET | ORAL | 0 refills | Status: AC

## 2024-11-23 NOTE — Progress Notes (Unsigned)
 " Assessment & Plan  #COPD #Acute Exacerbation of COPD  Presents with respiratory symptoms and a history of smoking that makes COPD the most likely presentation. He has diffuse wheezing, and COPD exacerbation is likely. Discussed with him that he would require a pulmonary function test to diagnosed COPD, but that we would start treatment empirically given the severity of his symptoms. Will start with PFT's, treat COPD exacerbation with antibiotics/steroids, and initiate a long acting inhaler (LABA/LAMA/ICS).  - Ordered pulmonary function test in 1-2 months. - cefpodoxime  (VANTIN ) 200 MG tablet; Take 1 tablet (200 mg total) by mouth 2 (two) times daily for 7 days.  Dispense: 14 tablet; Refill: 0 - azithromycin  (ZITHROMAX ) 250 MG tablet; Take 2 tablets (500 mg) on  Day 1,  followed by 1 tablet (250 mg) once daily on Days 2 through 5.  Dispense: 6 each; Refill: 0 - Fluticasone -Umeclidin-Vilant (TRELEGY ELLIPTA ) 100-62.5-25 MCG/ACT AEPB; Inhale 1 puff into the lungs daily.  Dispense: 30 each; Refill: 12 - predniSONE  (DELTASONE ) 50 MG tablet; Take 1 tablet (50 mg total) by mouth daily with breakfast for 5 days.  Dispense: 5 tablet; Refill: 0 - albuterol  (VENTOLIN  HFA) 108 (90 Base) MCG/ACT inhaler; Inhale 2 puffs into the lungs every 4 (four) hours as needed for wheezing or shortness of breath.  Dispense: 1 each; Refill: 0 - Pulmonary Function Test; Future  #Tobacco Use Disorder   Long-standing nicotine  dependence with unsuccessful quit attempts. Smoking cessation crucial to slow COPD progression and reduce lung cancer risk. Not ready to quit, but we will continue to discuss. Not ready to enroll in LDCT for screening, but will continue to discuss.  - Provided smoking cessation resources. - Encouraged reduction in smoking. - Discussed Zyn pouches as smoking reduction aid. - Will discuss lung cancer screening in follow-up.   Return in about 3 months (around 02/21/2025).  Belva November, MD [facility]  I spent 60 minutes caring for this patient today, including preparing to see the patient, obtaining a medical history , reviewing a separately obtained history, performing a medically appropriate examination and/or evaluation, counseling and educating the patient/family/caregiver, ordering medications, tests, or procedures, documenting clinical information in the electronic health record, and independently interpreting results (not separately reported/billed) and communicating results to the patient/family/caregiver  End of visit medications:  Meds ordered this encounter  Medications   cefpodoxime  (VANTIN ) 200 MG tablet    Sig: Take 1 tablet (200 mg total) by mouth 2 (two) times daily for 7 days.    Dispense:  14 tablet    Refill:  0   azithromycin  (ZITHROMAX ) 250 MG tablet    Sig: Take 2 tablets (500 mg) on  Day 1,  followed by 1 tablet (250 mg) once daily on Days 2 through 5.    Dispense:  6 each    Refill:  0   Fluticasone -Umeclidin-Vilant (TRELEGY ELLIPTA ) 100-62.5-25 MCG/ACT AEPB    Sig: Inhale 1 puff into the lungs daily.    Dispense:  30 each    Refill:  12   predniSONE  (DELTASONE ) 50 MG tablet    Sig: Take 1 tablet (50 mg total) by mouth daily with breakfast for 5 days.    Dispense:  5 tablet    Refill:  0   albuterol  (VENTOLIN  HFA) 108 (90 Base) MCG/ACT inhaler    Sig: Inhale 2 puffs into the lungs every 4 (four) hours as needed for wheezing or shortness of breath.    Dispense:  1 each  Refill:  0    Current Medications[1]   Subjective:   PATIENT ID: Tyrone Moore. GENDER: male DOB: 04/25/1951, MRN: [id number]  Chief Complaint  Patient presents with   Shortness of Breath    DOE. Wheezing. Cough with brown sputum. Going on for a few weeks. No fevers, chill or sweats.    HPI  Discussed the use of AI scribe software for clinical note transcription with the patient, who gave verbal consent to proceed.  History of Present Illness  Tyrone Moore. is a 74 year old male who presents with increased shortness of breath, wheezing, and cough..  He has experienced increased shortness of breath, wheezing, and cough over the past three to four days. The cough is productive with brown phlegm, differing from his usual dry smoker's cough. No fever or sore throat, but symptoms are similar to a cold or flu.  He has a long-standing history of wheezing for the past thirty years, with more noticeable breathing difficulties over the last ten to fifteen years. Inhalers, including albuterol , have been tried in the past without effectiveness. He was hospitalized in August and discharged with an inhaler, which he found ineffective. He received a month's supply of Wixela in August. He rates his current breathing symptoms as a seven out of ten, with ten being the worst. Prior to the recent exacerbation, he rated his symptoms as a six out of ten.  He has a significant smoking history, currently smoking one and a half packs per day for the past fifty-seven years. He has attempted to quit smoking two or three times, with the longest cessation period being one week in 2012.  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 He lives in a single-story house and has a dog.   Ancillary information including prior medications, full medical/surgical/family/social histories, and PFTs (when available) are listed below and have been reviewed.    Review of Systems  Constitutional:  Negative for chills, fever and weight loss.  Respiratory:  Positive for cough, sputum production, shortness of breath and wheezing. Negative for hemoptysis.   Cardiovascular:  Negative for chest pain.     Objective:   Vitals:   11/23/24 1305  BP: 122/70  Pulse: 72  Temp: 98.1 F (36.7 C)  SpO2: 97%  Weight: 204 lb 4.8 oz (92.7 kg)  Height: 5' 11 (1.803 m)   97% on RA  BMI Readings from Last 3 Encounters:  11/23/24 28.49  kg/m  09/09/24 29.00 kg/m  09/09/24 29.01 kg/m   Wt Readings from Last 3 Encounters:  11/23/24 204 lb 4.8 oz (92.7 kg)  09/09/24 207 lb 14.3 oz (94.3 kg)  09/09/24 208 lb (94.3 kg)    Physical Exam Constitutional:      Appearance: He is not ill-appearing.  Cardiovascular:     Rate and Rhythm: Normal rate and regular rhythm.     Pulses: Normal pulses.     Heart sounds: Normal heart sounds.  Pulmonary:     Breath sounds: Wheezing (diffuse expiratory) present.  Neurological:     General: No focal deficit present.     Mental Status: He is alert and oriented to person, place, and time. Mental status is at baseline.    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   Chronic bilateral low back pain without sciatica 03/26/2021   S/p 4 lumbar spine surgeries   Chronic kidney disease (CKD), stage V (HCC)    Chronic obstructive pulmonary disease, unspecified (HCC) 07/11/2021   no inhalers   COVID-19 virus infection 08/31/2021   Cyst of kidney, acquired    Essential hypertension 03/26/2021   GERD (gastroesophageal reflux disease)    Hyperlipidemia    Hyperparathyroidism    Hypertension    Mixed hyperlipidemia 03/26/2021   Pre-diabetes    Proteinuria    Thyroid  disease    Tobacco use disorder      Family History  Problem Relation Age of Onset   Hypertension Mother    Heart disease Mother    Heart disease Sister    Hypertension Sister    Prostate cancer Neg Hx    Bladder Cancer Neg Hx    Kidney cancer Neg Hx      Past Surgical History:  Procedure Laterality Date   A/V FISTULAGRAM N/A 06/14/2024   Procedure: A/V Fistulagram;  Surgeon: Marea Selinda RAMAN, MD;  Location: [facility];  Service: Cardiovascular;  Laterality: N/A;   A/V FISTULAGRAM Left 09/09/2024    Procedure: A/V Fistulagram;  Surgeon: Marea Selinda RAMAN, MD;  Location: [facility];  Service: Cardiovascular;  Laterality: Left;   AV FISTULA PLACEMENT Left 05/01/2023   Procedure: ARTERIOVENOUS (AV) FISTULA CREATION ( RADIOCEPHALIC);  Surgeon: Marea Selinda RAMAN, MD;  Location: [facility];  Service: Vascular;  Laterality: Left;   CYSTOSCOPY W/ RETROGRADES Bilateral 07/03/2021   Procedure: CYSTOSCOPY WITH RETROGRADE PYELOGRAM;  Surgeon: Penne Knee, MD;  Location: [facility];  Service: Urology;  Laterality: Bilateral;   CYSTOSCOPY WITH LITHOLAPAXY N/A 07/03/2021   Procedure: CYSTOSCOPY WITH LITHOLAPAXY;  Surgeon: Penne Knee, MD;  Location: [facility];  Service: Urology;  Laterality: N/A;   HOLEP-LASER ENUCLEATION OF THE PROSTATE WITH MORCELLATION N/A 07/03/2021   Procedure: HOLEP-LASER ENUCLEATION OF THE PROSTATE WITH MORCELLATION;  Surgeon: Penne Knee, MD;  Location: [facility];  Service: Urology;  Laterality: N/A;   SPINE SURGERY  2015   X 4 surgery - lumbar disc    Social History   Socioeconomic History   Marital status: Widowed    Spouse name: Not on file   Number of children: 2   Years of education: Not on file   Highest education level: Not on file  Occupational History   Occupation: retired  Tobacco Use   Smoking status: Heavy Smoker    Current packs/day: 1.50    Average packs/day: 1.5 packs/day for 57.0 years (85.5 ttl pk-yrs)    Types: Cigarettes    Start date: 1969    Passive exposure: Current   Smokeless tobacco: Never   Tobacco comments:    1.5 PPD- khj 11/23/2024        Started smoking at 74 years old.     Smoked 2PPD  at his heaviest.   Vaping Use   Vaping status: Never Used  Substance and Sexual Activity   Alcohol use: Not Currently    Comment: quit drinking at 3   Drug use: Never   Sexual activity: Not Currently    Partners: Female    Birth control/protection: None  Other Topics Concern   Not on file  Social History Narrative   Pt lives alone    Social Drivers of Health   Tobacco Use: High Risk (11/23/2024)   Patient History    Smoking Tobacco Use: Heavy Smoker  Smokeless Tobacco Use: Never    Passive Exposure: Current  Financial Resource Strain: Low Risk (09/09/2024)   Overall Financial Resource Strain (CARDIA)    Difficulty of Paying Living Expenses: Not hard at all  Food Insecurity: No Food Insecurity (09/09/2024)   Epic    Worried About Programme Researcher, Broadcasting/film/video in the Last Year: Never true    Ran Out of Food in the Last Year: Never true  Transportation Needs: No Transportation Needs (09/09/2024)   Epic    Lack of Transportation (Medical): No    Lack of Transportation (Non-Medical): No  Physical Activity: Sufficiently Active (09/09/2024)   Exercise Vital Sign    Days of Exercise per Week: 2 days    Minutes of Exercise per Session: 90 min  Stress: No Stress Concern Present (09/09/2024)   [employer] of Occupational Health - Occupational Stress Questionnaire    Feeling of Stress: Not at all  Social Connections: Moderately Isolated (09/09/2024)   Social Connection and Isolation Panel    Frequency of Communication with Friends and Family: More than three times a week    Frequency of Social Gatherings with Friends and Family: More than three times a week    Attends Religious Services: Never    Database Administrator or Organizations: Yes    Attends Engineer, Structural: More than 4 times per year    Marital Status: Widowed  Intimate Partner Violence: Not At Risk (09/09/2024)   Epic    Fear of Current or Ex-Partner: No    Emotionally Abused: No    Physically Abused: No    Sexually Abused: No  Depression (PHQ2-9): Low Risk (09/09/2024)   Depression (PHQ2-9)    PHQ-2 Score: 1  Recent Concern: Depression (PHQ2-9) - Medium Risk (06/29/2024)   Depression (PHQ2-9)    PHQ-2 Score: 8  Alcohol Screen: Low Risk (09/09/2024)   Alcohol Screen    Last Alcohol Screening Score (AUDIT): 0  Housing: Low Risk  (09/09/2024)   Epic    Unable to Pay for Housing in the Last Year: No    Number of Times Moved in the Last Year: 0    Homeless in the Last Year: No  Utilities: Not At Risk (09/09/2024)   Epic    Threatened with loss of utilities: No  Recent Concern: Utilities - At Risk (06/29/2024)   Epic    Threatened with loss of utilities: Yes  Health Literacy: Adequate Health Literacy (09/09/2024)   B1300 Health Literacy    Frequency of need for help with medical instructions: Never     Allergies[2]   CBC    Component Value Date/Time   WBC 5.7 07/01/2024 1425   RBC 3.09 (L) 07/01/2024 1425   HGB 10.3 (L) 07/01/2024 1425   HGB 12.2 (L) 03/26/2021 1453   HCT 31.5 (L) 07/01/2024 1425   HCT 35.8 (L) 03/26/2021 1453   PLT 134 (L) 07/01/2024 1425   PLT 186 03/26/2021 1453   MCV 101.9 (H) 07/01/2024 1425   MCV 97 03/26/2021 1453   MCH 33.3 07/01/2024 1425   MCHC 32.7 07/01/2024 1425   RDW 13.1 07/01/2024 1425   RDW 12.3 03/26/2021 1453   LYMPHSABS 1.2 06/29/2024 1631   LYMPHSABS 1.9 03/26/2021 1453   MONOABS 0.6 06/29/2024 1631   EOSABS 0.4 06/29/2024 1631   EOSABS 0.5 (H) 03/26/2021 1453   BASOSABS 0.1 06/29/2024 1631   BASOSABS 0.1 03/26/2021 1453    Pulmonary Functions Testing Results:     No data to  display          Outpatient Medications Prior to Visit  Medication Sig Dispense Refill   acetaminophen  (TYLENOL ) 500 MG tablet Take 1,000 mg by mouth every 6 (six) hours as needed for moderate pain.     amLODipine  (NORVASC ) 2.5 MG tablet Take 2.5 mg by mouth at bedtime.     Aspirin -Calcium  Carbonate 81-777 MG TABS Take 81 mg by mouth daily.     carvedilol  (COREG ) 25 MG tablet Take 1 tablet (25 mg total) by mouth 2 (two) times daily with a meal. 200 tablet 1   Cholecalciferol  (D3-1000 PO) Take 2,000 Units by mouth at bedtime.     ferrous sulfate  325 (65 FE) MG tablet Take 325 mg by mouth at bedtime.     levothyroxine  (SYNTHROID ) 112 MCG tablet Take 1 tablet (112 mcg total) by  mouth daily before breakfast. 90 tablet 0   lidocaine -prilocaine  (EMLA ) cream Apply 1 Application topically as directed.     lisinopril (ZESTRIL) 10 MG tablet Take 10 mg by mouth daily.     magnesium  gluconate (MAGONATE) 500 MG tablet Take 500 mg by mouth.     Multiple Vitamins-Minerals (MULTIVITAL-M PO) Take 1 tablet by mouth at bedtime.     omeprazole  (PRILOSEC) 20 MG capsule Take 1 capsule by mouth once daily 90 capsule 0   ondansetron  (ZOFRAN -ODT) 4 MG disintegrating tablet Take 4 mg by mouth every 6 (six) hours as needed.     rosuvastatin  (CRESTOR ) 20 MG tablet TAKE 1 TABLET BY MOUTH AT BEDTIME 90 tablet 0   albuterol  (VENTOLIN  HFA) 108 (90 Base) MCG/ACT inhaler Inhale 2 puffs into the lungs every 6 (six) hours as needed for wheezing or shortness of breath. (Patient not taking: Reported on 11/23/2024) 1 each 0   fluticasone -salmeterol (WIXELA INHUB ) 250-50 MCG/ACT AEPB Inhale 1 puff into the lungs 2 (two) times daily. (Patient not taking: Reported on 11/23/2024) 60 each 0   No facility-administered medications prior to visit.      [1]  Current Outpatient Medications:    acetaminophen  (TYLENOL ) 500 MG tablet, Take 1,000 mg by mouth every 6 (six) hours as needed for moderate pain., Disp: , Rfl:    amLODipine  (NORVASC ) 2.5 MG tablet, Take 2.5 mg by mouth at bedtime., Disp: , Rfl:    Aspirin -Calcium  Carbonate 81-777 MG TABS, Take 81 mg by mouth daily., Disp: , Rfl:    azithromycin  (ZITHROMAX ) 250 MG tablet, Take 2 tablets (500 mg) on  Day 1,  followed by 1 tablet (250 mg) once daily on Days 2 through 5., Disp: 6 each, Rfl: 0   carvedilol  (COREG ) 25 MG tablet, Take 1 tablet (25 mg total) by mouth 2 (two) times daily with a meal., Disp: 200 tablet, Rfl: 1   cefpodoxime  (VANTIN ) 200 MG tablet, Take 1 tablet (200 mg total) by mouth 2 (two) times daily for 7 days., Disp: 14 tablet, Rfl: 0   Cholecalciferol  (D3-1000 PO), Take 2,000 Units by mouth at bedtime., Disp: , Rfl:    ferrous sulfate  325 (65  FE) MG tablet, Take 325 mg by mouth at bedtime., Disp: , Rfl:    Fluticasone -Umeclidin-Vilant (TRELEGY ELLIPTA ) 100-62.5-25 MCG/ACT AEPB, Inhale 1 puff into the lungs daily., Disp: 30 each, Rfl: 12   levothyroxine  (SYNTHROID ) 112 MCG tablet, Take 1 tablet (112 mcg total) by mouth daily before breakfast., Disp: 90 tablet, Rfl: 0   lidocaine -prilocaine  (EMLA ) cream, Apply 1 Application topically as directed., Disp: , Rfl:    lisinopril (ZESTRIL) 10 MG tablet,  Take 10 mg by mouth daily., Disp: , Rfl:    magnesium  gluconate (MAGONATE) 500 MG tablet, Take 500 mg by mouth., Disp: , Rfl:    Multiple Vitamins-Minerals (MULTIVITAL-M PO), Take 1 tablet by mouth at bedtime., Disp: , Rfl:    omeprazole  (PRILOSEC) 20 MG capsule, Take 1 capsule by mouth once daily, Disp: 90 capsule, Rfl: 0   ondansetron  (ZOFRAN -ODT) 4 MG disintegrating tablet, Take 4 mg by mouth every 6 (six) hours as needed., Disp: , Rfl:    predniSONE  (DELTASONE ) 50 MG tablet, Take 1 tablet (50 mg total) by mouth daily with breakfast for 5 days., Disp: 5 tablet, Rfl: 0   rosuvastatin  (CRESTOR ) 20 MG tablet, TAKE 1 TABLET BY MOUTH AT BEDTIME, Disp: 90 tablet, Rfl: 0   albuterol  (VENTOLIN  HFA) 108 (90 Base) MCG/ACT inhaler, Inhale 2 puffs into the lungs every 4 (four) hours as needed for wheezing or shortness of breath., Disp: 1 each, Rfl: 0 [2] No Known Allergies  "

## 2024-11-23 NOTE — Patient Instructions (Signed)
 " VISIT SUMMARY: During your visit, we discussed your increased shortness of breath, wheezing, and productive cough over the past few days. We reviewed your long-standing history of breathing difficulties and smoking. We also addressed your chronic obstructive pulmonary disease (COPD) and nicotine  dependence.  YOUR PLAN: -CHRONIC OBSTRUCTIVE PULMONARY DISEASE WITH ACUTE EXACERBATION: COPD is a chronic lung condition that makes it hard to breathe due to long-term damage to the lungs, often from smoking. Your recent symptoms are likely due to a bacterial infection. We have prescribed antibiotics and a short course of prednisone  to reduce inflammation. You will also use a daily inhaler with two bronchodilators and one inhaled steroid to help open your airways and reduce inflammation. Please use the inhaler once daily in the morning and remember to wash your mouth after use. We have also ordered a pulmonary function test in 1-2 months to assess your lung function. Additionally, consult your pharmacist for inhaler insurance alternatives.  -NICOTINE  DEPENDENCE: Nicotine  dependence is an addiction to tobacco products. Quitting smoking is crucial to slow the progression of COPD and reduce the risk of lung cancer. We have provided you with smoking cessation resources and encouraged you to reduce smoking. We also discussed using Zyn pouches as an aid to help reduce smoking. We will discuss lung cancer screening during your follow-up visit.  INSTRUCTIONS: Please follow the prescribed medication regimen and use your inhaler as directed. Schedule a pulmonary function test in 1-2 months. Consult your pharmacist for inhaler insurance alternatives. Utilize the smoking cessation resources provided and consider using Zyn pouches to help reduce smoking. We will discuss lung cancer screening at your next follow-up visit.      The [place]  Quitline: Call 1-800-QUIT-NOW (780 565 2808). The [place] [facility]uit[facility] is a free  service for [place]  residents. Trained counselors are available from 8 am until 3 am, 365 days per year. Services are available in both English and Spanish.   Web Resources Free online support programs can help you track your progress and share experiences with others who are quitting. These are examples: www.becomeanex.org www.trytostop.org  www.smokefree.gov  www.https://www.vargas.com/.aspx  [facility]: offers comprehensive in-person tobacco treatment counseling at [facility] building ([address] [place] [address]).  Open to everyone. Virtual appointments available. Free parking. Call [phone] to schedule an appointment or [phone] for general information.    Tobacco Cessation Medications  Nicotine  Replacement Therapy (NRT)  Nicotine  is the addictive part of tobacco smoke, but not the most dangerous part. There are 7000 other toxins in cigarettes, including carbon monoxide, that cause disease. People do not generally become addicted to medication. Common problems: People don't use enough medication or stop too early. Medications are safe and effective. Overdose is very uncommon. Use medications as long as needed (3 months minimum). Some combinations work better than single medications. Long acting medications like the NRT patch and bupropion provide continuous treatment for withdrawal symptoms.  PLUS  Short acting medications like the NRT gum, lozenge, inhaler, and nasal spray help people to cope with breakthrough cravings.  ? Nicotine  Patch  Place patch on hairless skin on upper body, including arms and back. Each day: discard old patch, shower, apply new patch to a different site. Apply hydrocortisone cream to mildly red/irritated areas. Call provider if rash develops. If patch causes sleep disturbance, remove patch at bedtime and replace each morning after shower. Side effects may include: skin irritation,  headache, insomnia, abnormal/vivid dreams.  ? Nicotine  Gum  Chew gum slowly, park in  cheek when peppery taste or tingling sensation begins (about 15-30 chews). When taste or tingling goes away, begin chewing again. Use until nicotine  is gone (taste or tingle does not return, usually 30 minutes). Park in different areas of mouth. Nicotine  is absorbed through the lining of the mouth. Use enough to control cravings, up to 24 pieces per day (if used alone). Avoid eating or drinking for 15 minutes before using and during use. Side effects may include: mouth/jaw soreness, hiccups, indigestion, hypersalivation.  If gum is not chewed correctly, additional side effects may include lightheadedness, nausea/vomiting, throat and mouth irritation.  ? Nicotine  Lozenge  Allow to dissolve slowly in mouth (20-30 minutes). Do not chew or swallow. Nicotine  release may cause a warm tingling sensation. Occasionally rotate to different areas of the mouth. Use enough to control cravings, up to 20 lozenges per day (if used alone). Avoid eating or drinking for 15 minutes before using and during use. Side effects may include: nausea, hiccups, cough, heartburn, headache, gas, insomnia.  ? Nicotine  Nasal Spray Use 1 spray in each nostril (1 dose) and tilt head back for 1 minute. Do not sniff, swallow, or inhale through nose.  Use at least 8 doses (1 spray in each nostril) , up to 40 doses per day (if used alone). To reduce nasal irritation, spray on cotton swab and insert into nose. Side effects may include: nasal and/or throat irritation (hot, peppery, or burning sensation), nasal irritation, tearing, sneezing, cough, headache.  ? Nicotine  Oral Inhaler (puffer) Inhale into the back of the throat or puff in short breaths. Do not inhale into the lungs.  Puff continuously for 20 minutes (about 80 puffs) until cartridge is empty. Change cartridge when it loses the burning in throat sensation (feels like air  only). Open cartridges can be saved and used again within 24 hours. Use at least 6 and up to 16 cartridges per day (if used alone).  Avoid eating or drinking for 15 minutes before using and during use. Side effects may include: mouth and/or throat irritation, unpleasant taste, cough, nasal irritation, indigestion, hiccups, headache.  ? Chantix (varenicline) Days 1-3: Take one 0.5 mg white pill each morning for 3 days, one week before quit date. Days 4-7: Increase to one 0.5 mg white pill twice a day in morning and evening for 4 days.  On Day 8 (target quit date), increase to one 1 mg blue pill twice a day. Maintain this dose for a minimum of 3 months. Take with food and a full glass of water to reduce nausea. Be sure that the two doses are at least 8 hours apart, but try to take second dose early in the evening (i.e. 6 pm) to avoid sleep problems. Common side effects include: nausea, insomnia, headache, abnormal/vivid dreams. Tell your doctor if you have any history of psychiatric illness prior to starting Chantix.  STOP taking CHANTIX and contact a healthcare provider immediately if you experience agitation, hostility, depressed mood, changes in thoughts or behavior that are not typical for you, thinking about or attempting suicide, allergic or skin reactions including swelling, rash, redness, or peeling of the skin.  For patients who have heart disease: Smoking is a major risk factor for cardiovascular disease, and Chantix can help you quit smoking. Chantix may be associated with a small, increased risk of certain heart events in patients who have heart disease. If you have any new or worsening symptoms of heart disease while taking Chantix, such as shortness of breath or trouble  breathing, new or worsening chest pain, or new or worsening pain in your legs when walking, call your doctor or get emergency medical help immediately.  ? Wellbutrin / Zyban (bupropion) Take one 150 mg pill each morning  for 3 days, one week before target quit date. On Day 4, increase to one 150 mg pill twice a day, morning and evening.  Maintain this dose for a minimum of 3 months. Be sure that the two doses are at least 8 hours apart, but try to take second dose early in the evening (i.e. 6 pm) to avoid sleep problems. Avoid or minimize use of alcohol when taking this medication. Common side effects include: dry mouth, headache, insomnia, nausea, weight loss.  Risk of seizure is 11/998. STOP taking BUPROPION and contact a healthcare provider immediately if you experience agitation, hostility, depressed mood, changes in thoughts or behavior that are not typical for you, thinking about or attempting suicide, allergic or skin reactions including swelling, rash, redness, or peeling of the skin.                 Contains text generated by Abridge.                                 Contains text generated by Abridge.   "

## 2024-11-24 ENCOUNTER — Encounter: Payer: Self-pay | Admitting: Student

## 2024-11-24 ENCOUNTER — Ambulatory Visit: Admitting: Student

## 2024-11-24 VITALS — BP 136/70 | HR 95 | Ht 71.0 in | Wt 204.0 lb

## 2024-11-24 DIAGNOSIS — E039 Hypothyroidism, unspecified: Secondary | ICD-10-CM | POA: Diagnosis not present

## 2024-11-24 DIAGNOSIS — I10 Essential (primary) hypertension: Secondary | ICD-10-CM | POA: Diagnosis not present

## 2024-11-24 DIAGNOSIS — R7303 Prediabetes: Secondary | ICD-10-CM

## 2024-11-24 DIAGNOSIS — F17200 Nicotine dependence, unspecified, uncomplicated: Secondary | ICD-10-CM | POA: Diagnosis not present

## 2024-11-24 DIAGNOSIS — K219 Gastro-esophageal reflux disease without esophagitis: Secondary | ICD-10-CM | POA: Diagnosis not present

## 2024-11-24 DIAGNOSIS — J441 Chronic obstructive pulmonary disease with (acute) exacerbation: Secondary | ICD-10-CM | POA: Diagnosis not present

## 2024-11-24 DIAGNOSIS — I639 Cerebral infarction, unspecified: Secondary | ICD-10-CM

## 2024-11-24 DIAGNOSIS — J449 Chronic obstructive pulmonary disease, unspecified: Secondary | ICD-10-CM

## 2024-11-24 MED ORDER — AMLODIPINE BESYLATE 2.5 MG PO TABS: 2.5000 mg | ORAL_TABLET | Freq: Every evening | ORAL | 1 refills | Status: AC

## 2024-11-24 MED ORDER — LEVOTHYROXINE SODIUM 112 MCG PO TABS: 112.0000 ug | ORAL_TABLET | Freq: Every day | ORAL | 3 refills | Status: AC

## 2024-11-24 MED ORDER — BREZTRI AEROSPHERE 160-9-4.8 MCG/ACT IN AERO: 2.0000 | INHALATION_SPRAY | Freq: Two times a day (BID) | RESPIRATORY_TRACT | Status: AC

## 2024-11-24 MED ORDER — CARVEDILOL 25 MG PO TABS: 25.0000 mg | ORAL_TABLET | Freq: Two times a day (BID) | ORAL | 1 refills | Status: AC

## 2024-11-24 NOTE — Assessment & Plan Note (Addendum)
 TSH 2.067 in May, taking levothyroxine  112 mcg daily. TSH at next visit.

## 2024-11-24 NOTE — Assessment & Plan Note (Addendum)
 Smoking about 1.5 ppd a day, is pre contemplative regarding cessation. Revisit periodically.

## 2024-11-24 NOTE — Assessment & Plan Note (Signed)
 Control on omeprazole 

## 2024-11-24 NOTE — Assessment & Plan Note (Signed)
 Currently  ASA 81 mg daily and rosuvastatin  20 mg daily

## 2024-11-24 NOTE — Assessment & Plan Note (Addendum)
 Saw his pulmonologist yesterday Dr. Isadora prescribed cefpodoxime , azithromycin , prednisone , and Trelegy for COPD exacerbation.  Has increased cough, dyspnea and wheezing.  Has not picked up medication as Trelegy is quite expensive.  Gave him sample of Breztri  and encouraged him to pick up medications.  Reports he has never regularly used any inhalers.  Daughter-in-law will discuss with his pulmonologist to to discuss maintenance inhaler.

## 2024-11-24 NOTE — Assessment & Plan Note (Addendum)
 Stable on amlodipine  and coreg . Continue current medications

## 2024-11-24 NOTE — Progress Notes (Signed)
 "  Established Patient Office Visit  Subjective   Patient ID: Tyrone Tanguma., male    DOB: 1951-03-17  Age: 75 y.o. MRN: [id number]  Chief Complaint  Patient presents with   Hyperlipidemia    Tyrone Schreur. is a 74 y.o. person with medical hx listed below who presents today for HLD follow up. Increase dyspnea and cough lately.  Recently seen at his pulmonologist office for COPD exacerbation.  Daughter-in-law is present with him today.  Please refer to problem based charting for further details and assessment and plan of current problem and chronic medical conditions.   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   albuterol  (VENTOLIN  HFA) 108 (90 Base) MCG/ACT inhaler Inhale 2 puffs into the lungs every 4 (four) hours as needed for wheezing or shortness of breath.   Aspirin -Calcium  Carbonate 81-777 MG TABS Take 81 mg by mouth daily.   [EXPIRED] azithromycin  (ZITHROMAX )  250 MG tablet Take 2 tablets (500 mg) on  Day 1,  followed by 1 tablet (250 mg) once daily on Days 2 through 5.   budesonide-glycopyrrolate-formoterol (BREZTRI  AEROSPHERE) 160-9-4.8 MCG/ACT AERO inhaler Inhale 2 puffs into the lungs 2 (two) times daily.   [EXPIRED] cefpodoxime  (VANTIN ) 200 MG tablet Take 1 tablet (200 mg total) by mouth 2 (two) times daily for 7 days.   Cholecalciferol  (D3-1000 PO) Take 2,000 Units by mouth at bedtime.   Fluticasone -Umeclidin-Vilant (TRELEGY ELLIPTA ) 100-62.5-25 MCG/ACT AEPB Inhale 1 puff into the lungs daily.   lidocaine -prilocaine  (EMLA ) cream Apply 1 Application topically as directed.   magnesium  gluconate (MAGONATE) 500 MG tablet Take 500 mg by mouth.   Multiple Vitamins-Minerals (MULTIVITAL-M PO) Take 1 tablet by mouth at bedtime.   omeprazole  (PRILOSEC) 20 MG capsule Take 1 capsule by mouth once daily   ondansetron  (ZOFRAN -ODT) 4 MG disintegrating tablet Take 4 mg by mouth every 6 (six) hours as needed.   [EXPIRED] predniSONE  (DELTASONE ) 50 MG tablet Take 1 tablet (50 mg total) by mouth daily with breakfast for 5 days.   rosuvastatin  (CRESTOR ) 20 MG tablet TAKE 1 TABLET BY MOUTH AT BEDTIME   [DISCONTINUED] amLODipine  (NORVASC ) 2.5 MG tablet Take 2.5 mg by mouth at bedtime.   [DISCONTINUED] carvedilol  (COREG ) 25 MG tablet Take 1 tablet (25 mg total) by mouth 2 (two) times daily with a meal.   [DISCONTINUED] ferrous sulfate  325 (65 FE) MG tablet Take 325 mg by mouth at bedtime.   [DISCONTINUED] levothyroxine  (SYNTHROID ) 112 MCG tablet Take 1  tablet (112 mcg total) by mouth daily before breakfast.   [DISCONTINUED] lisinopril (ZESTRIL) 10 MG tablet Take 10 mg by mouth daily.   amLODipine  (NORVASC ) 2.5 MG tablet Take 1 tablet (2.5 mg total) by mouth at bedtime.   carvedilol  (COREG ) 25 MG tablet Take 1 tablet (25 mg total) by mouth 2 (two) times daily with a meal.   levothyroxine  (SYNTHROID ) 112 MCG tablet Take 1 tablet (112 mcg total) by mouth daily before  breakfast.   No facility-administered encounter medications on file as of 11/24/2024.    BP 136/70   Pulse 95   Ht 5' 11 (1.803 m)   Wt 204 lb (92.5 kg)   SpO2 94%   BMI 28.45 kg/m  BP Readings from Last 3 Encounters:  11/24/24 136/70  11/23/24 122/70  09/09/24 (!) 173/85    Physical Exam Constitutional:      Comments: Chronically ill-appearing  HENT:     Mouth/Throat:     Mouth: Mucous membranes are moist.     Pharynx: Oropharynx is clear.  Eyes:     Extraocular Movements: Extraocular movements intact.     Pupils: Pupils are equal, round, and reactive to light.  Cardiovascular:     Rate and Rhythm: Normal rate and regular rhythm.  Pulmonary:     Effort: Pulmonary effort is normal.     Breath sounds: Rhonchi present.     Comments: Increased work of breathing, scattered wheezes in bilateral lung fields Abdominal:     General: Abdomen is flat. Bowel sounds are normal. There is no distension.     Palpations: Abdomen is soft.     Tenderness: There is no abdominal tenderness.  Musculoskeletal:        General: Normal range of motion.     Right lower leg: No edema.     Left lower leg: No edema.  Skin:    General: Skin is warm and dry.     Capillary Refill: Capillary refill takes less than 2 seconds.  Neurological:     General: No focal deficit present.     Mental Status: He is alert and oriented to person, place, and time.  Psychiatric:        Mood and Affect: Mood normal.        Behavior: Behavior normal.        11/24/2024    2:31 PM 09/09/2024    9:21 AM 08/03/2024    3:47 PM  Depression screen PHQ 2/9  Decreased Interest 0 0 0  Down, Depressed, Hopeless 0 0 0  PHQ - 2 Score 0 0 0  Altered sleeping  0 0  Tired, decreased energy  1 0  Change in appetite  0 0  Feeling bad or failure about yourself   0 0  Trouble concentrating  0 0  Moving slowly or fidgety/restless  0 0  Suicidal thoughts  0 0  PHQ-9 Score  1  0   Difficult doing work/chores  Not  difficult at all Not difficult at all     Data saved with a previous flowsheet row definition       11/24/2024    2:31 PM 08/03/2024    3:47 PM 07/05/2024    1:47 PM 06/29/2024    1:35 PM  GAD 7 : Generalized Anxiety Score  Nervous, Anxious, on Edge 0  1  1  2    Control/stop worrying 0  1  1  2    Worry too much - different things  1  1  2   Trouble relaxing  0  1  2   Restless  0  1  2   Easily annoyed or irritable  0  1  2   Afraid - awful might happen  0  1  1   Total GAD 7 Score  3 7 13   Anxiety Difficulty  Somewhat difficult Somewhat difficult Somewhat difficult     Data saved with a previous flowsheet row definition    No results found for any visits on 11/24/24.    The ASCVD Risk score (Arnett DK, et al., 2019) failed to calculate for the following reasons:   Risk score cannot be calculated because patient has a medical history suggesting prior/existing ASCVD   * - Cholesterol units were assumed    Assessment & Plan:  COPD exacerbation [facility]) Assessment & Plan: Saw his pulmonologist yesterday Dr. Isadora prescribed cefpodoxime , azithromycin , prednisone , and Trelegy for COPD exacerbation.  Has increased cough, dyspnea and wheezing.  Has not picked up medication as Trelegy is quite expensive.  Gave him sample of Breztri  and encouraged him to pick up medications.  Reports he has never regularly used any inhalers.  Daughter-in-law will discuss with his pulmonologist to to discuss maintenance inhaler.   Tobacco use disorder Assessment & Plan: Smoking about 1.5 ppd a day, is pre contemplative regarding cessation. Revisit periodically.   Acquired hypothyroidism Assessment & Plan: TSH 2.067 in May, taking levothyroxine  112 mcg daily. TSH at next visit.   Orders: -     Levothyroxine  Sodium; Take 1 tablet (112 mcg total) by mouth daily before breakfast.  Dispense: 90 tablet; Refill: 3  Cerebrovascular accident (CVA), unspecified mechanism (HCC) Assessment & Plan: Currently   ASA 81 mg daily and rosuvastatin  20 mg daily   Essential hypertension Assessment & Plan: Stable on amlodipine  and coreg . Continue current medications   Orders: -     Carvedilol ; Take 1 tablet (25 mg total) by mouth 2 (two) times daily with a meal.  Dispense: 180 tablet; Refill: 1  GERD without esophagitis Assessment & Plan: Control on omeprazole    Prediabetes Assessment & Plan: Lab Results  Component Value Date   HGBA1C 5.8 (H) 03/19/2023   HGBA1C 5.7 (A) 09/17/2022   HGBA1C 6.0 (H) 08/31/2021  Repeat at next visit.    Other orders -     amLODIPine  Besylate; Take 1 tablet (2.5 mg total) by mouth at bedtime.  Dispense: 90 tablet; Refill: 1 -     Breztri  Aerosphere; Inhale 2 puffs into the lungs 2 (two) times daily.       Harlene Saddler, MD "

## 2024-12-06 NOTE — Assessment & Plan Note (Signed)
 Lab Results  Component Value Date   HGBA1C 5.8 (H) 03/19/2023   HGBA1C 5.7 (A) 09/17/2022   HGBA1C 6.0 (H) 08/31/2021  Repeat at next visit.

## 2024-12-21 ENCOUNTER — Encounter

## 2025-03-10 ENCOUNTER — Ambulatory Visit: Admitting: Student in an Organized Health Care Education/Training Program

## 2025-03-31 ENCOUNTER — Ambulatory Visit: Admitting: Student

## 2025-09-21 ENCOUNTER — Ambulatory Visit
